# Patient Record
Sex: Female | Born: 1980
Health system: Southern US, Community
[De-identification: ages and names within clinical notes are randomized; demographics above are authoritative.]

## PROBLEM LIST (undated history)

## (undated) ENCOUNTER — Ambulatory Visit: Discharge status: Absent without leave | Payer: 59

## (undated) ENCOUNTER — Ambulatory Visit (HOSPITAL_COMMUNITY): Payer: Medicare Other

## (undated) DIAGNOSIS — I10 Essential (primary) hypertension: Secondary | ICD-10-CM

## (undated) DIAGNOSIS — N051 Unspecified nephritic syndrome with focal and segmental glomerular lesions: Secondary | ICD-10-CM

## (undated) HISTORY — PX: NEPHRECTOMY TRANSPLANTED ORGAN: SUR880

## (undated) HISTORY — PX: AV FISTULA PLACEMENT: SHX1204

---

## 2013-12-26 ENCOUNTER — Encounter (HOSPITAL_COMMUNITY): Payer: Self-pay | Admitting: Emergency Medicine

## 2013-12-26 ENCOUNTER — Emergency Department (HOSPITAL_COMMUNITY)
Admission: EM | Admit: 2013-12-26 | Discharge: 2013-12-26 | Disposition: A | Discharge status: Home / Self Care | Payer: BC Managed Care – PPO | Attending: Emergency Medicine | Admitting: Emergency Medicine

## 2013-12-26 DIAGNOSIS — Z79899 Other long term (current) drug therapy: Secondary | ICD-10-CM | POA: Diagnosis not present

## 2013-12-26 DIAGNOSIS — R21 Rash and other nonspecific skin eruption: Secondary | ICD-10-CM | POA: Diagnosis present

## 2013-12-26 DIAGNOSIS — I120 Hypertensive chronic kidney disease with stage 5 chronic kidney disease or end stage renal disease: Secondary | ICD-10-CM | POA: Insufficient documentation

## 2013-12-26 DIAGNOSIS — L299 Pruritus, unspecified: Secondary | ICD-10-CM | POA: Diagnosis not present

## 2013-12-26 DIAGNOSIS — Z720 Tobacco use: Secondary | ICD-10-CM | POA: Diagnosis not present

## 2013-12-26 DIAGNOSIS — N186 End stage renal disease: Secondary | ICD-10-CM | POA: Diagnosis not present

## 2013-12-26 DIAGNOSIS — Z992 Dependence on renal dialysis: Secondary | ICD-10-CM | POA: Diagnosis not present

## 2013-12-26 HISTORY — DX: Unspecified nephritic syndrome with focal and segmental glomerular lesions: N05.1

## 2013-12-26 HISTORY — DX: Essential (primary) hypertension: I10

## 2013-12-26 LAB — COMPREHENSIVE METABOLIC PANEL
ALK PHOS: 67 U/L (ref 39–117)
ALT: 12 U/L (ref 0–35)
AST: 12 U/L (ref 0–37)
Albumin: 3.5 g/dL (ref 3.5–5.2)
Anion gap: 17 — ABNORMAL HIGH (ref 5–15)
BUN: 79 mg/dL — ABNORMAL HIGH (ref 6–23)
CO2: 18 mEq/L — ABNORMAL LOW (ref 19–32)
Calcium: 10 mg/dL (ref 8.4–10.5)
Chloride: 101 mEq/L (ref 96–112)
Creatinine, Ser: 5.79 mg/dL — ABNORMAL HIGH (ref 0.50–1.10)
GFR calc non Af Amer: 9 mL/min — ABNORMAL LOW (ref >90)
GFR, EST AFRICAN AMERICAN: 10 mL/min — AB (ref >90)
GLUCOSE: 103 mg/dL — AB (ref 70–99)
POTASSIUM: 5 meq/L (ref 3.7–5.3)
SODIUM: 136 meq/L — AB (ref 137–147)
Total Bilirubin: 0.3 mg/dL (ref 0.3–1.2)
Total Protein: 7.5 g/dL (ref 6.0–8.3)

## 2013-12-26 LAB — CBC WITH DIFFERENTIAL/PLATELET
Basophils Absolute: 0 10*3/uL (ref 0.0–0.1)
Basophils Relative: 0 % (ref 0–1)
EOS ABS: 0.1 10*3/uL (ref 0.0–0.7)
Eosinophils Relative: 0 % (ref 0–5)
HCT: 34.1 % — ABNORMAL LOW (ref 36.0–46.0)
Hemoglobin: 11.6 g/dL — ABNORMAL LOW (ref 12.0–15.0)
LYMPHS ABS: 1.2 10*3/uL (ref 0.7–4.0)
LYMPHS PCT: 7 % — AB (ref 12–46)
MCH: 25.4 pg — AB (ref 26.0–34.0)
MCHC: 34 g/dL (ref 30.0–36.0)
MCV: 74.6 fL — ABNORMAL LOW (ref 78.0–100.0)
Monocytes Absolute: 0.4 10*3/uL (ref 0.1–1.0)
Monocytes Relative: 2 % — ABNORMAL LOW (ref 3–12)
NEUTROS PCT: 90 % — AB (ref 43–77)
Neutro Abs: 15.1 10*3/uL — ABNORMAL HIGH (ref 1.7–7.7)
Platelets: 234 10*3/uL (ref 150–400)
RBC: 4.57 MIL/uL (ref 3.87–5.11)
RDW: 13.1 % (ref 11.5–15.5)
WBC: 16.7 10*3/uL — AB (ref 4.0–10.5)

## 2013-12-26 LAB — URINALYSIS, ROUTINE W REFLEX MICROSCOPIC
BILIRUBIN URINE: NEGATIVE
GLUCOSE, UA: NEGATIVE mg/dL
Ketones, ur: NEGATIVE mg/dL
Leukocytes, UA: NEGATIVE
Nitrite: NEGATIVE
PH: 5.5 (ref 5.0–8.0)
Protein, ur: 100 mg/dL — AB
Specific Gravity, Urine: 1.011 (ref 1.005–1.030)
Urobilinogen, UA: 0.2 mg/dL (ref 0.0–1.0)

## 2013-12-26 LAB — URINE MICROSCOPIC-ADD ON

## 2013-12-26 MED ORDER — SODIUM CHLORIDE 0.9 % IV BOLUS (SEPSIS)
1000.0000 mL | Freq: Once | INTRAVENOUS | Status: AC
  Administered 2013-12-26: 1000 mL via INTRAVENOUS

## 2013-12-26 MED ORDER — HYDROXYZINE HCL 10 MG PO TABS
10.0000 mg | ORAL_TABLET | Freq: Once | ORAL | Status: AC
  Administered 2013-12-26: 10 mg via ORAL
  Filled 2013-12-26: qty 1

## 2013-12-26 MED ORDER — HYDROXYZINE HCL 10 MG PO TABS: 10.0000 mg | ORAL_TABLET | Freq: Three times a day (TID) | ORAL | Status: DC | PRN

## 2013-12-26 NOTE — Discharge Instructions (Signed)
Follow-up with your nephrologist. Return to the ER if symptoms worsen, or if you develop difficulty breathing, chest pain, high fever, cough, difficulty urinating, nausea, vomiting, diarrhea.  Pruritus  Pruritus is an itch. There are many different problems that can cause an itch. Dry skin is one of the most common causes of itching. Most cases of itching do not require medical attention.  HOME CARE INSTRUCTIONS  Make sure your skin is moistened on a regular basis. A moisturizer that contains petroleum jelly is best for keeping moisture in your skin. If you develop a rash, you may try the following for relief:   Use corticosteroid cream.  Apply cool compresses to the affected areas.  Bathe with Epsom salts or baking soda in the bathwater.  Soak in colloidal oatmeal baths. These are available at your pharmacy.  Apply baking soda paste to the rash. Stir water into baking soda until it reaches a paste-like consistency.  Use an anti-itch lotion.  Take over-the-counter diphenhydramine medicine by mouth as the instructions direct.  Avoid scratching. Scratching may cause the rash to become infected. If itching is very bad, your caregiver may suggest prescription lotions or creams to lessen your symptoms.  Avoid hot showers, which can make itching worse. A cold shower may help with itching as long as you use a moisturizer after the shower. SEEK MEDICAL CARE IF: The itching does not go away after several days. Document Released: 09/12/2010 Document Revised: 05/17/2013 Document Reviewed: 09/12/2010 [facility] Patient Information 2015 [place], [place]. This information is not intended to replace advice given to you by your health care provider. Make sure you discuss any questions you have with your health care provider.  Chronic Kidney Disease Chronic kidney disease occurs when the kidneys are damaged over a long period. The kidneys are two organs that lie on either side of the spine between the  middle of the back and the front of the abdomen. The kidneys:   Remove wastes and extra water from the blood.   Produce important hormones. These help keep bones strong, regulate blood pressure, and help create red blood cells.   Balance the fluids and chemicals in the blood and tissues. A small amount of kidney damage may not cause problems, but a large amount of damage may make it difficult or impossible for the kidneys to work the way they should. If steps are not taken to slow down the kidney damage or stop it from getting worse, the kidneys may stop working permanently. Most of the time, chronic kidney disease does not go away. However, it can often be controlled, and those with the disease can usually live normal lives. CAUSES  The most common causes of chronic kidney disease are diabetes and high blood pressure (hypertension). Chronic kidney disease may also be caused by:   Diseases that cause the kidneys' filters to become inflamed.   Diseases that affect the immune system.   Genetic diseases.   Medicines that damage the kidneys, such as anti-inflammatory medicines.  Poisoning or exposure to toxic substances.   A reoccurring kidney or urinary infection.   A problem with urine flow. This may be caused by:   Cancer.   Kidney stones.   An enlarged prostate in males. SIGNS AND SYMPTOMS  Because the kidney damage in chronic kidney disease occurs slowly, symptoms develop slowly and may not be obvious until the kidney damage becomes severe. A person may have a kidney disease for years without showing any symptoms. Symptoms can include:  Swelling (edema) of the legs, ankles, or feet.   Tiredness (lethargy).   Nausea or vomiting.   Confusion.   Problems with urination, such as:   Decreased urine production.   Frequent urination, especially at night.   Frequent accidents in children who are potty trained.   Muscle twitches and cramps.   Shortness  of breath.  Weakness.   Persistent itchiness.   Loss of appetite.  Metallic taste in the mouth.  Trouble sleeping.  Slowed development in children.  Short stature in children. DIAGNOSIS  Chronic kidney disease may be detected and diagnosed by tests, including blood, urine, imaging, or kidney biopsy tests.  TREATMENT  Most chronic kidney diseases cannot be cured. Treatment usually involves relieving symptoms and preventing or slowing the progression of the disease. Treatment may include:   A special diet. You may need to avoid alcohol and foods thatare salty and high in potassium.   Medicines. These may:   Lower blood pressure.   Relieve anemia.   Relieve swelling.   Protect the bones. HOME CARE INSTRUCTIONS   Follow your prescribed diet.   Take medicines only as directed by your health care provider. Do not take any new medicines (prescription, over-the-counter, or nutritional supplements) unless approved by your health care provider. Many medicines can worsen your kidney damage or need to have the dose adjusted.   Quit smoking if you smoke. Talk to your health care provider about a smoking cessation program.   Keep all follow-up visits as directed by your health care provider. SEEK IMMEDIATE MEDICAL CARE IF:  Your symptoms get worse or you develop new symptoms.   You develop symptoms of end-stage kidney disease. These include:   Headaches.   Abnormally dark or light skin.   Numbness in the hands or feet.   Easy bruising.   Frequent hiccups.   Menstruation stops.   You have a fever.   You have decreased urine production.   You havepain or bleeding when urinating. MAKE SURE YOU:  Understand these instructions.  Will watch your condition.  Will get help right away if you are not doing well or get worse. FOR MORE INFORMATION   American Association of Kidney Patients: BombTimer.gl  National Kidney Foundation:  www.kidney.[place]: https://mathis.com/  [facility]: www.lifeoptions.org and www.kidneyschool.org Document Released: 10/10/2007 Document Revised: 05/17/2013 Document Reviewed: 08/30/2011 [facility] Patient Information 2015 [place], [place]. This information is not intended to replace advice given to you by your health care provider. Make sure you discuss any questions you have with your health care provider.

## 2013-12-26 NOTE — ED Notes (Signed)
Ordered diet tray 

## 2013-12-26 NOTE — ED Notes (Signed)
Meal tray was ordered.

## 2013-12-26 NOTE — ED Notes (Signed)
Pt. reports generalized skin itching for 3 days unrelieved by prescription Prednisone and Benadryl , air way intact / respirations unlabored .

## 2013-12-26 NOTE — ED Provider Notes (Signed)
CSN: [id number]     Arrival date & time 12/26/13  0100 History   First MD Initiated Contact with Patient 12/26/13 [phone]     Chief Complaint  Patient presents with  . Rash     (Consider location/radiation/quality/duration/timing/severity/associated sxs/prior Treatment) HPI Meghan Richardson is a 33 year old female with past medical history of end-stage renal disease/FSGS, hypertension who presents the ER complaining of pruritus. Patient states for the past 3 days she is had a pruritic rash which began on her neck, moved to her chest, face, arms, legs, abdomen, and did not remain in any one place. Patient states she spoke to her nephrologist who prescribed her with a dose of prednisone which she has been taking for the past 3 days. She states she's also been taking Benadryl multiple times a day, and neither of these therapies that helped with her pruritus or rash. Patient denies shortness of breath, facial swelling, oral swelling, wheezing, dizziness, lightheadedness, nausea, vomiting. Patient states she's never had a reaction similar to this, and denies eating any new foods, using any new chemical products, denies any recent travel.  Past Medical History  Diagnosis Date  . FSGS (focal segmental glomerulosclerosis)   . Hypertension    History reviewed. No pertinent past surgical history. No family history on file. History  Substance Use Topics  . Smoking status: Current Some Day Smoker  . Smokeless tobacco: Not on file  . Alcohol Use: No   OB History    No data available     Review of Systems  Constitutional: Negative for fever.  HENT: Negative for trouble swallowing.   Eyes: Negative for visual disturbance.  Respiratory: Negative for shortness of breath.   Cardiovascular: Negative for chest pain.  Gastrointestinal: Negative for nausea, vomiting and abdominal pain.  Genitourinary: Negative for dysuria.  Musculoskeletal: Negative for neck pain.  Skin: Negative for rash.  Neurological:  Negative for dizziness, weakness and numbness.  Psychiatric/Behavioral: Negative.       Allergies  Review of patient's allergies indicates no known allergies.  Home Medications   Prior to Admission medications   Medication Sig Start Date End Date Taking? Authorizing Provider  calcitRIOL (ROCALTROL) 0.5 MCG capsule Take 0.5 mcg by mouth daily.   Yes Historical Provider, MD  metoprolol tartrate (LOPRESSOR) 25 MG tablet Take 25 mg by mouth 2 (two) times daily.   Yes Historical Provider, MD  predniSONE (DELTASONE) 10 MG tablet Take 10-20 mg by mouth daily with breakfast. Day 1: Two tablets before breakfast, one after lunch, one after dinner, and two at bedtime. If started late in the day, take two tablets every hour for three hours, unless otherwise directed by prescriber.  Day 2: One tablet before breakfast, one after lunch, one after dinner, and two at bedtime  Day 3: One tablet before breakfast, one after lunch, one after dinner, and one at bedtime  Day 4: One tablet before breakfast, one after lunch, and one at bedtime  Day 5: One tablet before breakfast and one at bedtime  Day 6: One tablet before breakfast   Yes Historical Provider, MD  hydrOXYzine (ATARAX/VISTARIL) 10 MG tablet Take 1 tablet (10 mg total) by mouth 3 (three) times daily as needed for itching. 12/26/13   Carrie Mew, PA-C   BP 153/91 mmHg  Pulse 50  Temp(Src) 97.7 F (36.5 C) (Oral)  Resp 17  Ht 5' 6.5" (1.689 m)  Wt 160 lb (72.576 kg)  BMI 25.44 kg/m2  SpO2 98%  LMP  Physical Exam  Constitutional: She is oriented to person, place, and time. She appears well-developed and well-nourished. No distress.  HENT:  Head: Normocephalic and atraumatic.  Eyes: Right eye exhibits no discharge. Left eye exhibits no discharge. No scleral icterus.  Neck: Normal range of motion.  Cardiovascular: Normal rate, regular rhythm and normal heart sounds.   Pulmonary/Chest: Effort normal and breath sounds normal. No respiratory  distress.  Musculoskeletal: Normal range of motion.  Neurological: She is alert and oriented to person, place, and time.  Skin: Skin is warm and dry. She is not diaphoretic.  Pruritic, erythematous macular rash noted on patient's face, upper chest, left upper leg, bilateral lower legs.  Psychiatric: She has a normal mood and affect.  Nursing note and vitals reviewed.   ED Course  Procedures (including critical care time) Labs Review Labs Reviewed  CBC WITH DIFFERENTIAL - Abnormal; Notable for the following:    WBC 16.7 (*)    Hemoglobin 11.6 (*)    HCT 34.1 (*)    MCV 74.6 (*)    MCH 25.4 (*)    Neutrophils Relative % 90 (*)    Neutro Abs 15.1 (*)    Lymphocytes Relative 7 (*)    Monocytes Relative 2 (*)    All other components within normal limits  COMPREHENSIVE METABOLIC PANEL - Abnormal; Notable for the following:    Sodium 136 (*)    CO2 18 (*)    Glucose, Bld 103 (*)    BUN 79 (*)    Creatinine, Ser 5.79 (*)    GFR calc non Af Amer 9 (*)    GFR calc Af Amer 10 (*)    Anion gap 17 (*)    All other components within normal limits  URINALYSIS, ROUTINE W REFLEX MICROSCOPIC - Abnormal; Notable for the following:    APPearance CLOUDY (*)    Hgb urine dipstick TRACE (*)    Protein, ur 100 (*)    All other components within normal limits  URINE MICROSCOPIC-ADD ON - Abnormal; Notable for the following:    Squamous Epithelial / LPF FEW (*)    All other components within normal limits    Imaging Review No results found.   EKG Interpretation None      MDM   Final diagnoses:  Pruritic disorder   Patient here 3 days of pruritus. Patient reporting that the pruritus began prior to her eruption of rash. Patient is placed on prednisone by her nephrologist for possible urticarial rash. Patient reporting comminution of prednisone and Benadryl around-the-clock have not improved her pruritus at all. Today we will attempt hydroxyzine for patient pruritus, follow-up with basic  labs and urine to rule out uremic pruritus.  Patient's labs remarkable for elevated BUN/creatinine. Patient states her BUN and creatinine have been consistently elevated at her previous nephrology visits, however she is unsure of the exact values. GFR 10, patient states her baseline is 11. UA unremarkable for acute pathology. Patient reports she also has urgent area at baseline. Electrolyte abnormalities and leukocytosis thought to be due to patient's baseline renal dysfunction and mild dehydration. We will treat with fluids, and reassess.  After hydroxyzine and fluids, patient states her pruritus has improved greatly. Patient asymptomatic on reexamination, and rash has subsided. Etiology of patient's neuritis is unclear, however uremic pruritus is a definite possibility. I strongly encouraged patient to follow up with her nephrologist regarding this episode. I discussed return precautions with patient and encourage her to call or return to ER should she have any  questions or concerns.  BP 153/91 mmHg  Pulse 50  Temp(Src) 97.7 F (36.5 C) (Oral)  Resp 17  Ht 5' 6.5" (1.689 m)  Wt 160 lb (72.576 kg)  BMI 25.44 kg/m2  SpO2 98%  LMP   Signed,  Dahlia Bailiff, PA-C 11:47 AM   Pt seen and discussed with Dr. Carmin Muskrat, MD  Carrie Mew, PA-C 12/26/13 1147  Carmin Muskrat, MD 12/26/13 [phone]

## 2013-12-26 NOTE — ED Notes (Signed)
MD at bedside. 

## 2013-12-26 NOTE — ED Notes (Signed)
Pt alert x4 respirations easy non labored.  

## 2014-07-21 ENCOUNTER — Encounter (HOSPITAL_COMMUNITY): Payer: Self-pay | Admitting: *Deleted

## 2014-07-21 ENCOUNTER — Emergency Department (HOSPITAL_COMMUNITY)
Admission: EM | Admit: 2014-07-21 | Discharge: 2014-07-22 | Disposition: A | Discharge status: Home / Self Care | Payer: Medicaid Other | Attending: Emergency Medicine | Admitting: Emergency Medicine

## 2014-07-21 DIAGNOSIS — I10 Essential (primary) hypertension: Secondary | ICD-10-CM | POA: Insufficient documentation

## 2014-07-21 DIAGNOSIS — Z79899 Other long term (current) drug therapy: Secondary | ICD-10-CM | POA: Diagnosis not present

## 2014-07-21 DIAGNOSIS — Z3202 Encounter for pregnancy test, result negative: Secondary | ICD-10-CM | POA: Diagnosis not present

## 2014-07-21 DIAGNOSIS — R1031 Right lower quadrant pain: Secondary | ICD-10-CM | POA: Diagnosis present

## 2014-07-21 DIAGNOSIS — Z8742 Personal history of other diseases of the female genital tract: Secondary | ICD-10-CM | POA: Diagnosis not present

## 2014-07-21 DIAGNOSIS — Z720 Tobacco use: Secondary | ICD-10-CM | POA: Diagnosis not present

## 2014-07-21 LAB — CBC WITH DIFFERENTIAL/PLATELET
Basophils Absolute: 0 10*3/uL (ref 0.0–0.1)
Basophils Relative: 0 % (ref 0–1)
Eosinophils Absolute: 0.2 10*3/uL (ref 0.0–0.7)
Eosinophils Relative: 3 % (ref 0–5)
HCT: 29 % — ABNORMAL LOW (ref 36.0–46.0)
Hemoglobin: 9.8 g/dL — ABNORMAL LOW (ref 12.0–15.0)
LYMPHS ABS: 2.1 10*3/uL (ref 0.7–4.0)
LYMPHS PCT: 30 % (ref 12–46)
MCH: 25.5 pg — ABNORMAL LOW (ref 26.0–34.0)
MCHC: 33.8 g/dL (ref 30.0–36.0)
MCV: 75.3 fL — ABNORMAL LOW (ref 78.0–100.0)
MONO ABS: 0.3 10*3/uL (ref 0.1–1.0)
MONOS PCT: 4 % (ref 3–12)
NEUTROS PCT: 63 % (ref 43–77)
Neutro Abs: 4.6 10*3/uL (ref 1.7–7.7)
Platelets: 224 10*3/uL (ref 150–400)
RBC: 3.85 MIL/uL — AB (ref 3.87–5.11)
RDW: 13.4 % (ref 11.5–15.5)
WBC: 7.2 10*3/uL (ref 4.0–10.5)

## 2014-07-21 LAB — COMPREHENSIVE METABOLIC PANEL
ALT: 10 U/L — ABNORMAL LOW (ref 14–54)
ANION GAP: 10 (ref 5–15)
AST: 14 U/L — AB (ref 15–41)
Albumin: 3 g/dL — ABNORMAL LOW (ref 3.5–5.0)
Alkaline Phosphatase: 54 U/L (ref 38–126)
BILIRUBIN TOTAL: 0.3 mg/dL (ref 0.3–1.2)
BUN: 76 mg/dL — AB (ref 6–20)
CALCIUM: 9.4 mg/dL (ref 8.9–10.3)
CHLORIDE: 111 mmol/L (ref 101–111)
CO2: 20 mmol/L — ABNORMAL LOW (ref 22–32)
CREATININE: 7.51 mg/dL — AB (ref 0.44–1.00)
GFR calc Af Amer: 7 mL/min — ABNORMAL LOW (ref >60)
GFR, EST NON AFRICAN AMERICAN: 6 mL/min — AB (ref >60)
Glucose, Bld: 92 mg/dL (ref 65–99)
Potassium: 4.9 mmol/L (ref 3.5–5.1)
Sodium: 141 mmol/L (ref 135–145)
Total Protein: 6.6 g/dL (ref 6.5–8.1)

## 2014-07-21 LAB — URINALYSIS, ROUTINE W REFLEX MICROSCOPIC
Bilirubin Urine: NEGATIVE
Glucose, UA: 100 mg/dL — AB
KETONES UR: NEGATIVE mg/dL
LEUKOCYTES UA: NEGATIVE
NITRITE: NEGATIVE
Protein, ur: 100 mg/dL — AB
Specific Gravity, Urine: 1.009 (ref 1.005–1.030)
Urobilinogen, UA: 0.2 mg/dL (ref 0.0–1.0)
pH: 6 (ref 5.0–8.0)

## 2014-07-21 LAB — URINE MICROSCOPIC-ADD ON

## 2014-07-21 LAB — LIPASE, BLOOD: Lipase: 72 U/L — ABNORMAL HIGH (ref 22–51)

## 2014-07-21 MED ORDER — MORPHINE SULFATE 4 MG/ML IJ SOLN
4.0000 mg | Freq: Once | INTRAMUSCULAR | Status: AC
  Administered 2014-07-22: 4 mg via INTRAVENOUS
  Filled 2014-07-21: qty 1

## 2014-07-21 MED ORDER — DELFLEX-LC/1.5% DEXTROSE 346 MOSM/L IP SOLN: Freq: Once | INTRAPERITONEAL | Status: DC

## 2014-07-21 NOTE — ED Notes (Signed)
Pt sent here from [facility] to have her peritoneal dialysis port checked out.  Pt states sharp pain to R side of abdomen that radiates to her vagina.  Pt is concerned b/c her dialysis catheter has blood it in.  Only mild tenderness noted to R abdomen - catheter on L side of abdomen.

## 2014-07-21 NOTE — ED Provider Notes (Signed)
CSN: [id number]     Arrival date & time 07/21/14  1552 History   First MD Initiated Contact with Patient 07/21/14 2002     Chief Complaint  Patient presents with  . Abdominal Pain     (Consider location/radiation/quality/duration/timing/severity/associated sxs/prior Treatment) Patient is a 34 y.o. female presenting with abdominal pain. The history is provided by the patient. No language interpreter was used.  Abdominal Pain Pain location:  RLQ Pain quality: aching and sharp   Pain radiates to:  Does not radiate Pain severity:  Moderate Duration:  2 days Timing:  Intermittent Progression:  Waxing and waning Chronicity:  New Context comment:  Peritoneal fluid is cloudy Relieved by:  Nothing Worsened by:  Nothing tried Ineffective treatments:  None tried Associated symptoms: no chest pain, no chills, no constipation, no cough, no diarrhea, no dysuria, no fatigue, no fever, no nausea, no shortness of breath, no sore throat and no vomiting   Risk factors comment:  FSGS with known CKD and peritoneal dialysis catheter   Past Medical History  Diagnosis Date  . Hypertension   . FSGS (focal segmental glomerulosclerosis)    History reviewed. No pertinent past surgical history. No family history on file. History  Substance Use Topics  . Smoking status: Current Some Day Smoker  . Smokeless tobacco: Not on file  . Alcohol Use: No   OB History    No data available     Review of Systems  Constitutional: Negative for fever, chills, diaphoresis, activity change, appetite change and fatigue.  HENT: Negative for congestion, facial swelling, rhinorrhea and sore throat.   Eyes: Negative for photophobia and discharge.  Respiratory: Negative for cough, chest tightness and shortness of breath.   Cardiovascular: Negative for chest pain, palpitations and leg swelling.  Gastrointestinal: Positive for abdominal pain. Negative for nausea, vomiting, diarrhea and constipation.  Endocrine: Negative  for polydipsia and polyuria.  Genitourinary: Negative for dysuria, frequency, difficulty urinating and pelvic pain.  Musculoskeletal: Negative for back pain, arthralgias, neck pain and neck stiffness.  Skin: Negative for color change and wound.  Allergic/Immunologic: Negative for immunocompromised state.  Neurological: Negative for facial asymmetry, weakness, numbness and headaches.  Hematological: Does not bruise/bleed easily.  Psychiatric/Behavioral: Negative for confusion and agitation.      Allergies  Review of patient's allergies indicates no known allergies.  Home Medications   Prior to Admission medications   Medication Sig Start Date End Date Taking? Authorizing Provider  calcitRIOL (ROCALTROL) 0.5 MCG capsule Take 0.5 mcg by mouth daily.   Yes Historical Provider, MD  metoprolol tartrate (LOPRESSOR) 25 MG tablet Take 25 mg by mouth 2 (two) times daily.   Yes Historical Provider, MD   BP 143/101 mmHg  Pulse 70  Temp(Src) 98.2 F (36.8 C) (Oral)  Resp 16  Ht 5\' 7"  (1.702 m)  Wt 162 lb (73.483 kg)  BMI 25.37 kg/m2  SpO2 99% Physical Exam  Constitutional: She is oriented to person, place, and time. She appears well-developed and well-nourished. No distress.  HENT:  Head: Normocephalic and atraumatic.  Mouth/Throat: No oropharyngeal exudate.  Eyes: Pupils are equal, round, and reactive to light.  Neck: Normal range of motion. Neck supple.  Cardiovascular: Normal rate, regular rhythm and normal heart sounds.  Exam reveals no gallop and no friction rub.   No murmur heard. Pulmonary/Chest: Effort normal and breath sounds normal. No respiratory distress. She has no wheezes. She has no rales.  Abdominal: Soft. Bowel sounds are normal. She exhibits no distension and no  mass. There is no tenderness. There is no rebound and no guarding.    Musculoskeletal: Normal range of motion. She exhibits no edema or tenderness.  Neurological: She is alert and oriented to person, place,  and time.  Skin: Skin is warm and dry.  Psychiatric: She has a normal mood and affect.    ED Course  Procedures (including critical care time) Labs Review Labs Reviewed  CBC WITH DIFFERENTIAL/PLATELET - Abnormal; Notable for the following:    RBC 3.85 (*)    Hemoglobin 9.8 (*)    HCT 29.0 (*)    MCV 75.3 (*)    MCH 25.5 (*)    All other components within normal limits  COMPREHENSIVE METABOLIC PANEL - Abnormal; Notable for the following:    CO2 20 (*)    BUN 76 (*)    Creatinine, Ser 7.51 (*)    Albumin 3.0 (*)    AST 14 (*)    ALT 10 (*)    GFR calc non Af Amer 6 (*)    GFR calc Af Amer 7 (*)    All other components within normal limits  LIPASE, BLOOD - Abnormal; Notable for the following:    Lipase 72 (*)    All other components within normal limits  URINALYSIS, ROUTINE W REFLEX MICROSCOPIC (NOT AT [facility]) - Abnormal; Notable for the following:    Glucose, UA 100 (*)    Hgb urine dipstick SMALL (*)    Protein, ur 100 (*)    All other components within normal limits  URINE MICROSCOPIC-ADD ON - Abnormal; Notable for the following:    Squamous Epithelial / LPF FEW (*)    Bacteria, UA FEW (*)    All other components within normal limits  BODY FLUID CULTURE  LACTATE DEHYDROGENASE, BODY FLUID  GLUCOSE, PERITONEAL FLUID  PROTEIN, BODY FLUID  ALBUMIN, FLUID  BODY FLUID CELL COUNT WITH DIFFERENTIAL  CSF CELL COUNT WITH DIFFERENTIAL  I-STAT BETA HCG BLOOD, ED ([facility], [facility], [facility] ONLY)    Imaging Review No results found.   EKG Interpretation None      MDM   Final diagnoses:  RLQ abdominal pain    Pt is a 34 y.o. female with Pmhx as above who presents with R sided abdominal pain and cloudy fluid from dialysis port.  Patient was diagnosed with FSGS at age 60, states she has had appears Milta Deiters dialysis catheter since the spring but has not yet been started on dialysis.  She states that she has a home health nurse come and flush catheter once or twice a week and this week on 2  occasions, the fluid was cloudy.  She also started having some right-sided abdominal pain.  She denies fevers, chills, nausea, vomiting, diarrhea.  She has been living in [place] since April after moving from [place], [place].  However, she has not yet set up care with a nephrologist or her primary doctor.  She says she will be referred to [facility] center, but does not yet have an appointment. On PE, VSS, pt in NAD.  She is localized right lower quadrant tenderness without rebound or guarding.  Her to peritoneal fluid in her left-sided peritoneal dialysis catheter is cloudy and blood tinged.  I spoke to dialysis nurse, who will infuse saline for 2 hours, which will then be drawn off for cell count and differential and culture. Marland Kitchen  Spoke with Dr. Alona Bene dating of [place] [facility], who recommends patient be started on IV Fortaz 2g and vancomycin 20  mg/kg,  If the cell count is greater than 100.  Given that she has no outpatient follow-up.  I feel she will have to be admitted to medical service, if instilled fluid is concerning for peritonitis. Dr. Kathrynn Humble will f/u on CBC/diff and CT.        Ernestina Patches, MD 07/22/14 [phone]

## 2014-07-22 ENCOUNTER — Emergency Department (HOSPITAL_COMMUNITY): Payer: Medicaid Other

## 2014-07-22 LAB — BODY FLUID CELL COUNT WITH DIFFERENTIAL: Total Nucleated Cell Count, Fluid: 0 cu mm (ref 0–1000)

## 2014-07-22 LAB — GLUCOSE, PERITONEAL FLUID: GLUCOSE, PERITONEAL FLUID: 1357 mg/dL

## 2014-07-22 LAB — ALBUMIN, FLUID (OTHER): Albumin, Fluid: <1 g/dL

## 2014-07-22 LAB — PROTEIN, BODY FLUID

## 2014-07-22 LAB — LACTATE DEHYDROGENASE, PLEURAL OR PERITONEAL FLUID

## 2014-07-22 LAB — I-STAT BETA HCG BLOOD, ED (MC, WL, AP ONLY): I-stat hCG, quantitative: <5 m[IU]/mL (ref <5)

## 2014-07-22 MED ORDER — OXYCODONE-ACETAMINOPHEN 5-325 MG PO TABS
1.0000 | ORAL_TABLET | Freq: Once | ORAL | Status: AC
  Administered 2014-07-22: 1 via ORAL
  Filled 2014-07-22: qty 1

## 2014-07-22 MED ORDER — HYDROCODONE-ACETAMINOPHEN 5-325 MG PO TABS: 1.0000 | ORAL_TABLET | Freq: Four times a day (QID) | ORAL | Status: DC | PRN

## 2014-07-22 NOTE — Discharge Instructions (Signed)
Abdominal Pain, Women °Abdominal (stomach, pelvic, or belly) pain can be caused by many things. It is important to tell your doctor: °· The location of the pain. °· Does it come and go or is it present all the time? °· Are there things that start the pain (eating certain foods, exercise)? °· Are there other symptoms associated with the pain (fever, nausea, vomiting, diarrhea)? °All of this is helpful to know when trying to find the cause of the pain. °CAUSES  °· Stomach: virus or bacteria infection, or ulcer. °· Intestine: appendicitis (inflamed appendix), regional ileitis (Crohn's disease), ulcerative colitis (inflamed colon), irritable bowel syndrome, diverticulitis (inflamed diverticulum of the colon), or cancer of the stomach or intestine. °· Gallbladder disease or stones in the gallbladder. °· Kidney disease, kidney stones, or infection. °· Pancreas infection or cancer. °· Fibromyalgia (pain disorder). °· Diseases of the female organs: °¨ Uterus: fibroid (non-cancerous) tumors or infection. °¨ Fallopian tubes: infection or tubal pregnancy. °¨ Ovary: cysts or tumors. °¨ Pelvic adhesions (scar tissue). °¨ Endometriosis (uterus lining tissue growing in the pelvis and on the pelvic organs). °¨ Pelvic congestion syndrome (female organs filling up with blood just before the menstrual period). °¨ Pain with the menstrual period. °¨ Pain with ovulation (producing an egg). °¨ Pain with an IUD (intrauterine device, birth control) in the uterus. °¨ Cancer of the female organs. °· Functional pain (pain not caused by a disease, may improve without treatment). °· Psychological pain. °· Depression. °DIAGNOSIS  °Your doctor will decide the seriousness of your pain by doing an examination. °· Blood tests. °· X-rays. °· Ultrasound. °· CT scan (computed tomography, special type of X-ray). °· MRI (magnetic resonance imaging). °· Cultures, for infection. °· Barium enema (dye inserted in the large intestine, to better view it with  X-rays). °· Colonoscopy (looking in intestine with a lighted tube). °· Laparoscopy (minor surgery, looking in abdomen with a lighted tube). °· Major abdominal exploratory surgery (looking in abdomen with a large incision). °TREATMENT  °The treatment will depend on the cause of the pain.  °· Many cases can be observed and treated at home. °· Over-the-counter medicines recommended by your caregiver. °· Prescription medicine. °· Antibiotics, for infection. °· Birth control pills, for painful periods or for ovulation pain. °· Hormone treatment, for endometriosis. °· Nerve blocking injections. °· Physical therapy. °· Antidepressants. °· Counseling with a psychologist or psychiatrist. °· Minor or major surgery. °HOME CARE INSTRUCTIONS  °· Do not take laxatives, unless directed by your caregiver. °· Take over-the-counter pain medicine only if ordered by your caregiver. Do not take aspirin because it can cause an upset stomach or bleeding. °· Try a clear liquid diet (broth or water) as ordered by your caregiver. Slowly move to a bland diet, as tolerated, if the pain is related to the stomach or intestine. °· Have a thermometer and take your temperature several times a day, and record it. °· Bed rest and sleep, if it helps the pain. °· Avoid sexual intercourse, if it causes pain. °· Avoid stressful situations. °· Keep your follow-up appointments and tests, as your caregiver orders. °· If the pain does not go away with medicine or surgery, you may try: °¨ Acupuncture. °¨ Relaxation exercises (yoga, meditation). °¨ Group therapy. °¨ Counseling. °SEEK MEDICAL CARE IF:  °· You notice certain foods cause stomach pain. °· Your home care treatment is not helping your pain. °· You need stronger pain medicine. °· You want your IUD removed. °· You feel faint or   lightheaded. °· You develop nausea and vomiting. °· You develop a rash. °· You are having side effects or an allergy to your medicine. °SEEK IMMEDIATE MEDICAL CARE IF:  °· Your  pain does not go away or gets worse. °· You have a fever. °· Your pain is felt only in portions of the abdomen. The right side could possibly be appendicitis. The left lower portion of the abdomen could be colitis or diverticulitis. °· You are passing blood in your stools (bright red or black tarry stools, with or without vomiting). °· You have blood in your urine. °· You develop chills, with or without a fever. °· You pass out. °MAKE SURE YOU:  °· Understand these instructions. °· Will watch your condition. °· Will get help right away if you are not doing well or get worse. °Document Released: 10/28/2006 Document Revised: 05/17/2013 Document Reviewed: 11/17/2008 °[employer]® Patient Information ©2015 [employer]. This information is not intended to replace advice given to you by your health care provider. Make sure you discuss any questions you have with your health care provider. ° °

## 2014-07-22 NOTE — ED Provider Notes (Signed)
  Physical Exam  BP 122/85 mmHg  Pulse 60  Temp(Src) 98.2 F (36.8 C) (Oral)  Resp 16  Ht 5\' 7"  (1.702 m)  Wt 162 lb (73.483 kg)  BMI 25.37 kg/m2  SpO2 100%  Physical Exam  ED Course  Procedures  MDM  Pt signed out to me by Dr. Tawnya Crook. Pt's CT scan is neg. Pt came for abd pain, she has ESRD and has catheter for peritoneal dialysis started. Pt had peritoneal fluid sent to the lab, and the results are pending. The lab results are normal - so we will discharge.       Varney Biles, MD 07/22/14 [phone]

## 2014-07-22 NOTE — ED Notes (Signed)
Pt returned from ct

## 2014-07-22 NOTE — ED Notes (Signed)
Pt verbalizes understanding of d/c instructions and denies any further needs at this time. 

## 2014-07-25 LAB — BODY FLUID CULTURE: Culture: NO GROWTH

## 2014-07-26 ENCOUNTER — Ambulatory Visit: Payer: Medicaid Other | Admitting: Family Medicine

## 2014-09-23 ENCOUNTER — Other Ambulatory Visit: Payer: Self-pay | Admitting: Nephrology

## 2014-09-23 ENCOUNTER — Ambulatory Visit
Admission: RE | Admit: 2014-09-23 | Discharge: 2014-09-23 | Disposition: A | Discharge status: Home / Self Care | Payer: Medicaid Other | Source: Ambulatory Visit | Attending: Nephrology | Admitting: Nephrology

## 2014-09-23 DIAGNOSIS — M25511 Pain in right shoulder: Secondary | ICD-10-CM

## 2014-09-23 DIAGNOSIS — Z95828 Presence of other vascular implants and grafts: Secondary | ICD-10-CM

## 2014-10-02 ENCOUNTER — Encounter (HOSPITAL_COMMUNITY): Payer: Self-pay | Admitting: *Deleted

## 2014-10-02 DIAGNOSIS — Z992 Dependence on renal dialysis: Secondary | ICD-10-CM

## 2014-10-02 DIAGNOSIS — I120 Hypertensive chronic kidney disease with stage 5 chronic kidney disease or end stage renal disease: Secondary | ICD-10-CM | POA: Diagnosis present

## 2014-10-02 DIAGNOSIS — Z79891 Long term (current) use of opiate analgesic: Secondary | ICD-10-CM

## 2014-10-02 DIAGNOSIS — K5900 Constipation, unspecified: Secondary | ICD-10-CM | POA: Diagnosis present

## 2014-10-02 DIAGNOSIS — Y841 Kidney dialysis as the cause of abnormal reaction of the patient, or of later complication, without mention of misadventure at the time of the procedure: Secondary | ICD-10-CM | POA: Diagnosis present

## 2014-10-02 DIAGNOSIS — D631 Anemia in chronic kidney disease: Secondary | ICD-10-CM | POA: Diagnosis present

## 2014-10-02 DIAGNOSIS — N186 End stage renal disease: Secondary | ICD-10-CM | POA: Diagnosis present

## 2014-10-02 DIAGNOSIS — Z79899 Other long term (current) drug therapy: Secondary | ICD-10-CM

## 2014-10-02 DIAGNOSIS — K659 Peritonitis, unspecified: Secondary | ICD-10-CM | POA: Diagnosis present

## 2014-10-02 DIAGNOSIS — T8029XA Infection following other infusion, transfusion and therapeutic injection, initial encounter: Principal | ICD-10-CM | POA: Diagnosis present

## 2014-10-02 NOTE — ED Notes (Signed)
Pt states that she has peritonitis because her peritoneal dialysis fluid is cloudy.

## 2014-10-03 ENCOUNTER — Encounter (HOSPITAL_COMMUNITY): Payer: Self-pay | Admitting: Internal Medicine

## 2014-10-03 ENCOUNTER — Inpatient Hospital Stay (HOSPITAL_COMMUNITY)
Admission: EM | Admit: 2014-10-03 | Discharge: 2014-10-03 | DRG: 867 | Disposition: A | Discharge status: Home / Self Care | Payer: Medicaid Other | Attending: Internal Medicine | Admitting: Internal Medicine

## 2014-10-03 DIAGNOSIS — K658 Other peritonitis: Secondary | ICD-10-CM | POA: Diagnosis present

## 2014-10-03 DIAGNOSIS — N269 Renal sclerosis, unspecified: Secondary | ICD-10-CM

## 2014-10-03 DIAGNOSIS — I120 Hypertensive chronic kidney disease with stage 5 chronic kidney disease or end stage renal disease: Secondary | ICD-10-CM | POA: Diagnosis present

## 2014-10-03 DIAGNOSIS — B9689 Other specified bacterial agents as the cause of diseases classified elsewhere: Secondary | ICD-10-CM

## 2014-10-03 DIAGNOSIS — Y841 Kidney dialysis as the cause of abnormal reaction of the patient, or of later complication, without mention of misadventure at the time of the procedure: Secondary | ICD-10-CM | POA: Diagnosis present

## 2014-10-03 DIAGNOSIS — N186 End stage renal disease: Secondary | ICD-10-CM

## 2014-10-03 DIAGNOSIS — K652 Spontaneous bacterial peritonitis: Secondary | ICD-10-CM

## 2014-10-03 DIAGNOSIS — T8029XA Infection following other infusion, transfusion and therapeutic injection, initial encounter: Secondary | ICD-10-CM | POA: Diagnosis present

## 2014-10-03 DIAGNOSIS — N189 Chronic kidney disease, unspecified: Secondary | ICD-10-CM

## 2014-10-03 DIAGNOSIS — K659 Peritonitis, unspecified: Secondary | ICD-10-CM | POA: Diagnosis present

## 2014-10-03 DIAGNOSIS — D631 Anemia in chronic kidney disease: Secondary | ICD-10-CM | POA: Diagnosis present

## 2014-10-03 DIAGNOSIS — Z79891 Long term (current) use of opiate analgesic: Secondary | ICD-10-CM | POA: Diagnosis not present

## 2014-10-03 DIAGNOSIS — Z79899 Other long term (current) drug therapy: Secondary | ICD-10-CM | POA: Diagnosis not present

## 2014-10-03 DIAGNOSIS — Z992 Dependence on renal dialysis: Secondary | ICD-10-CM | POA: Diagnosis not present

## 2014-10-03 DIAGNOSIS — K5900 Constipation, unspecified: Secondary | ICD-10-CM | POA: Diagnosis present

## 2014-10-03 DIAGNOSIS — T8571XA Infection and inflammatory reaction due to peritoneal dialysis catheter, initial encounter: Secondary | ICD-10-CM | POA: Diagnosis present

## 2014-10-03 LAB — CBC WITH DIFFERENTIAL/PLATELET
Basophils Absolute: 0 10*3/uL (ref 0.0–0.1)
Basophils Relative: 0 %
Eosinophils Absolute: 0.3 10*3/uL (ref 0.0–0.7)
Eosinophils Relative: 2 %
HEMATOCRIT: 29.6 % — AB (ref 36.0–46.0)
HEMOGLOBIN: 9.8 g/dL — AB (ref 12.0–15.0)
Lymphocytes Relative: 22 %
Lymphs Abs: 2.5 10*3/uL (ref 0.7–4.0)
MCH: 25.3 pg — AB (ref 26.0–34.0)
MCHC: 33.1 g/dL (ref 30.0–36.0)
MCV: 76.5 fL — ABNORMAL LOW (ref 78.0–100.0)
MONOS PCT: 4 %
Monocytes Absolute: 0.5 10*3/uL (ref 0.1–1.0)
NEUTROS ABS: 7.9 10*3/uL — AB (ref 1.7–7.7)
NEUTROS PCT: 72 %
Platelets: 262 10*3/uL (ref 150–400)
RBC: 3.87 MIL/uL (ref 3.87–5.11)
RDW: 13.2 % (ref 11.5–15.5)
WBC: 11.2 10*3/uL — ABNORMAL HIGH (ref 4.0–10.5)

## 2014-10-03 LAB — ALBUMIN, FLUID (OTHER): Albumin, Fluid: <1 g/dL

## 2014-10-03 LAB — PROTEIN, BODY FLUID

## 2014-10-03 LAB — BODY FLUID CELL COUNT WITH DIFFERENTIAL
Eos, Fluid: 1 %
Lymphs, Fluid: 1 %
Monocyte-Macrophage-Serous Fluid: 8 % — ABNORMAL LOW (ref 50–90)
Neutrophil Count, Fluid: 90 % — ABNORMAL HIGH (ref 0–25)
Total Nucleated Cell Count, Fluid: 17000 cu mm — ABNORMAL HIGH (ref 0–1000)

## 2014-10-03 LAB — BASIC METABOLIC PANEL
Anion gap: 9 (ref 5–15)
BUN: 58 mg/dL — ABNORMAL HIGH (ref 6–20)
CHLORIDE: 109 mmol/L (ref 101–111)
CO2: 23 mmol/L (ref 22–32)
Calcium: 8.9 mg/dL (ref 8.9–10.3)
Creatinine, Ser: 8.88 mg/dL — ABNORMAL HIGH (ref 0.44–1.00)
GFR calc non Af Amer: 5 mL/min — ABNORMAL LOW (ref >60)
GFR, EST AFRICAN AMERICAN: 6 mL/min — AB (ref >60)
Glucose, Bld: 88 mg/dL (ref 65–99)
Potassium: 4.7 mmol/L (ref 3.5–5.1)
Sodium: 141 mmol/L (ref 135–145)

## 2014-10-03 LAB — LACTIC ACID, PLASMA: LACTIC ACID, VENOUS: 0.6 mmol/L (ref 0.5–2.0)

## 2014-10-03 LAB — LACTATE DEHYDROGENASE, PLEURAL OR PERITONEAL FLUID: LD, Fluid: 96 U/L — ABNORMAL HIGH (ref 3–23)

## 2014-10-03 LAB — GLUCOSE, PERITONEAL FLUID: Glucose, Peritoneal Fluid: 130 mg/dL

## 2014-10-03 MED ORDER — ONDANSETRON HCL 4 MG/2ML IJ SOLN
4.0000 mg | Freq: Once | INTRAMUSCULAR | Status: AC
  Administered 2014-10-03: 4 mg via INTRAVENOUS
  Filled 2014-10-03: qty 2

## 2014-10-03 MED ORDER — DEXTROSE 5 % IV SOLN
2.0000 g | Freq: Once | INTRAVENOUS | Status: AC
  Administered 2014-10-03: 2 g via INTRAVENOUS
  Filled 2014-10-03: qty 2

## 2014-10-03 MED ORDER — VANCOMYCIN HCL IN DEXTROSE 1-5 GM/200ML-% IV SOLN
1000.0000 mg | Freq: Once | INTRAVENOUS | Status: AC
  Administered 2014-10-03: 1000 mg via INTRAVENOUS
  Filled 2014-10-03: qty 200

## 2014-10-03 MED ORDER — DEXTROSE 5 % IV SOLN: 500.0000 mg | INTRAVENOUS | Status: DC

## 2014-10-03 MED ORDER — OXYCODONE-ACETAMINOPHEN 5-325 MG PO TABS
1.0000 | ORAL_TABLET | Freq: Once | ORAL | Status: AC
  Administered 2014-10-03: 1 via ORAL
  Filled 2014-10-03: qty 1

## 2014-10-03 MED ORDER — MORPHINE SULFATE (PF) 4 MG/ML IV SOLN
4.0000 mg | Freq: Once | INTRAVENOUS | Status: AC
  Administered 2014-10-03: 4 mg via INTRAVENOUS
  Filled 2014-10-03: qty 1

## 2014-10-03 NOTE — Discharge Summary (Signed)
Name: Meghan Richardson MRN: [id number] DOB: [birth year] 34 y.o. PCP: No Pcp Per Patient  Date of Admission: 10/03/2014  2:20 AM Date of Discharge: 10/03/2014 Attending Physician: No att. providers found  Discharge Diagnosis: Principal Problem:   Peritonitis associated with peritoneal dialysis Active Problems:   Hypertension due to end stage renal disease on dialysis   Anemia in chronic kidney disease (CKD)  Discharge Medications:   Medication List    TAKE these medications        calcitRIOL 0.5 MCG capsule  Commonly known as:  ROCALTROL  Take 0.5 mcg by mouth daily.     HYDROcodone-acetaminophen 5-325 MG per tablet  Commonly known as:  NORCO/VICODIN  Take 1 tablet by mouth every 6 (six) hours as needed.     metoprolol tartrate 25 MG tablet  Commonly known as:  LOPRESSOR  Take 25 mg by mouth 2 (two) times daily.        Disposition and follow-up:   Ms.Meghan Richardson was discharged from [facility] in Stable condition.  At the hospital follow up visit please address:  1. Patient discharged to follow up with Dr Joelyn Oms today for further treatment with intraperitoneal antibiotics  2.  Labs / imaging needed at time of follow-up: none  3.  Pending labs/ test needing follow-up: Blood cultures and peritoneal fluid culture  Follow-up Appointments: Follow-up Information    Follow up with Rexene Agent, MD.   Specialty:  Nephrology   Why:  please go to your appointment today with Dr Dossie Arbour information:   [place] [phone]       Discharge Instructions: Discharge Instructions    Call MD for:  persistant nausea and vomiting    Complete by:  As directed      Call MD for:  severe uncontrolled pain    Complete by:  As directed      Call MD for:  temperature >100.4    Complete by:  As directed      Diet - low sodium heart healthy    Complete by:  As directed      Increase activity slowly    Complete by:  As  directed            Consultations: Treatment Team:  Roney Jaffe, MD  Procedures Performed:  Dg Abd 1 View  09/23/2014   CLINICAL DATA:  Dialysis catheter.  EXAM: ABDOMEN - 1 VIEW  COMPARISON:  07/22/2014.  FINDINGS: Peritoneal dialysis catheter is noted intact with its tip coiled in the right lower quadrant. No bowel distention. Stool noted throughout the colon. Pelvic calcifications consistent phleboliths. No acute bony abnormality.  IMPRESSION: Peritoneal dialysis catheter noted intact and in stable position. No acute abnormality.   Electronically Signed   By: Marcello Moores  Register   On: 09/23/2014 15:44    Admission HPI: Meghan Richardson is a 34 year old female with PMH of ESRD 2/2 FSGS who recently started peritoneal dialysis 3 weeks ago. She reports that yesterday she started to have some abdominal cramping similar to her menstrual cycle but was not due for her menses. She reports that the cramping intensified this morning so she called her dialysis nurse who instructed her to come to the ED for evaluation for possible peritonitis. She reports that her abdominal pain continued to intensify. She denies any fever or chills. She reports that besides her abdominal pain she feels like her normal healthy self. In the ED peritoneal fluid was  sent and was suggestive of infections and she was started on broad spectrum abx.  Her dialysis catheter was placed in April, she does report one previous episode of peritonitis which she reports was the nurses fault. She notes that she has been doing everything right and she is not sure how she got this infection. Dr Joelyn Oms is her nephrologist.  Hospital Course : Principal Problem:   Peritonitis associated with peritoneal dialysis Active Problems:   Hypertension due to end stage renal disease on dialysis   Anemia in chronic kidney disease (CKD)   Patient presented with abdominal cramping and abdominal pain.  She had recently completed a 10 day course of  intraperitoneal antibiotics for culture negative peritonitis.  A peritoneal fluid sample was obtained and showed 17,000 WBC (neutrophil predominance) c/w bacterial peritonitis although gram stain was negative.  She was started on IV Vancomycin and Ceftazidime and IMTS was consulted for admission.  After admission she was evaluated by Dr Jonnie Finner nephrology who recommended she be discharged home as she had a follow up appointment scheduled for 4pm with Dr Joelyn Oms who will resume intraperitoneal antibiotics.  She was discharged with specific instructions to follow up today with Dr Joelyn Oms and if unable she needs to return to the ED.   Discharge Vitals:   BP 108/72 mmHg  Pulse 71  Temp(Src) 98.1 F (36.7 C) (Oral)  Resp 18  Ht 5\' 7"  (1.702 m)  Wt 158 lb (71.668 kg)  BMI 24.74 kg/m2  SpO2 97%  Discharge Labs:  Results for orders placed or performed during the hospital encounter of 10/03/14 (from the past 24 hour(s))  CBC with Differential/Platelet     Status: Abnormal   Collection Time: 10/03/14  2:42 AM  Result Value Ref Range   WBC 11.2 (H) 4.0 - 10.5 K/uL   RBC 3.87 3.87 - 5.11 MIL/uL   Hemoglobin 9.8 (L) 12.0 - 15.0 g/dL   HCT 29.6 (L) 36.0 - 46.0 %   MCV 76.5 (L) 78.0 - 100.0 fL   MCH 25.3 (L) 26.0 - 34.0 pg   MCHC 33.1 30.0 - 36.0 g/dL   RDW 13.2 11.5 - 15.5 %   Platelets 262 150 - 400 K/uL   Neutrophils Relative % 72 %   Neutro Abs 7.9 (H) 1.7 - 7.7 K/uL   Lymphocytes Relative 22 %   Lymphs Abs 2.5 0.7 - 4.0 K/uL   Monocytes Relative 4 %   Monocytes Absolute 0.5 0.1 - 1.0 K/uL   Eosinophils Relative 2 %   Eosinophils Absolute 0.3 0.0 - 0.7 K/uL   Basophils Relative 0 %   Basophils Absolute 0.0 0.0 - 0.1 K/uL  Basic metabolic panel     Status: Abnormal   Collection Time: 10/03/14  2:42 AM  Result Value Ref Range   Sodium 141 135 - 145 mmol/L   Potassium 4.7 3.5 - 5.1 mmol/L   Chloride 109 101 - 111 mmol/L   CO2 23 22 - 32 mmol/L   Glucose, Bld 88 65 - 99 mg/dL   BUN 58  (H) 6 - 20 mg/dL   Creatinine, Ser 8.88 (H) 0.44 - 1.00 mg/dL   Calcium 8.9 8.9 - 10.3 mg/dL   GFR calc non Af Amer 5 (L) >60 mL/min   GFR calc Af Amer 6 (L) >60 mL/min   Anion gap 9 5 - 15  Lactic acid, plasma     Status: None   Collection Time: 10/03/14  2:42 AM  Result Value Ref Range  Lactic Acid, Venous 0.6 0.5 - 2.0 mmol/L  Lactate dehydrogenase, Peritoneal fluid     Status: Abnormal   Collection Time: 10/03/14  4:33 AM  Result Value Ref Range   LD, Fluid 96 (H) 3 - 23 U/L   Fluid Type-FLDH FLUID   Glucose, Peritoneal fluid     Status: None   Collection Time: 10/03/14  4:33 AM  Result Value Ref Range   Glucose, Peritoneal Fluid 130 mg/dL  Protein, Peritoneal fluid     Status: None   Collection Time: 10/03/14  4:33 AM  Result Value Ref Range   Total protein, fluid <3.0 g/dL   Fluid Type-FTP FLUID   Albumin, Peritoneal fluid     Status: None   Collection Time: 10/03/14  4:33 AM  Result Value Ref Range   Albumin, Fluid <1.0 g/dL   Fluid Type-FALB FLUID   Body fluid culture     Status: None (Preliminary result)   Collection Time: 10/03/14  4:33 AM  Result Value Ref Range   Specimen Description FLUID PERITONEAL    Special Requests NONE    Gram Stain      ABUNDANT WBC PRESENT,BOTH PMN AND MONONUCLEAR NO ORGANISMS SEEN RESULT CALLED TO, READ BACK BY AND VERIFIED WITH: L.BISHOP,RN [id number] 10/03/14 M.CAMPBELL    Culture PENDING    Report Status PENDING   Body fluid cell count with differential     Status: Abnormal   Collection Time: 10/03/14  5:54 AM  Result Value Ref Range   Fluid Type-FCT PERITONEAL    Color, Fluid COLORLESS (A) YELLOW   Appearance, Fluid TURBID (A) CLEAR   WBC, Fluid 17000 (H) 0 - 1000 cu mm   Neutrophil Count, Fluid 90 (H) 0 - 25 %   Lymphs, Fluid 1 %   Monocyte-Macrophage-Serous Fluid 8 (L) 50 - 90 %   Eos, Fluid 1 %    Signed: Jule Ser, DO 10/03/2014, 2:50 PM    Services Ordered on Discharge: none Equipment Ordered on Discharge:  none

## 2014-10-03 NOTE — Consult Note (Signed)
Renal Service Consult Note [facility]  Meghan Richardson 10/03/2014 [place] Requesting Physician:  Dr Baxter Flattery, ED at [facility]  Reason for Consult:  eSRD pt with abd pain, on PD HPI: The patient is a 34 y.o. year-old with hx of FSGS started PD about 1 month ago. She had an episode of infected fluid early and just finished a course of IP abx about 1 week ago.  She comes to ED with abd pain x 24 hrs.  No n/v/d, no fevers, chills or sweats.  No cough or SOB , no CP.  PD fluid sample drained in ED by [id number] nurse showed TNC 17,000 and gram stain no organisms seen.   She is frustrated and is not sure "what I am doing wrong".  She is compliant according to the nurses at the PD clinic.     Past Medical History  Past Medical History  Diagnosis Date  . Hypertension   . FSGS (focal segmental glomerulosclerosis)     Reports Kidney Bx at age 25   Past Surgical History History reviewed. No pertinent past surgical history. Family History  Family History  Problem Relation Age of Onset  . Kidney disease Brother     FSGS   Social History  reports that she has quit smoking. She does not have any smokeless tobacco history on file. She reports that she does not drink alcohol or use illicit drugs. Allergies No Known Allergies Home medications Prior to Admission medications   Medication Sig Start Date End Date Taking? Authorizing Provider  calcitRIOL (ROCALTROL) 0.5 MCG capsule Take 0.5 mcg by mouth daily.    Historical Provider, MD  HYDROcodone-acetaminophen (NORCO/VICODIN) 5-325 MG per tablet Take 1 tablet by mouth every 6 (six) hours as needed. 07/22/14   Varney Biles, MD  metoprolol tartrate (LOPRESSOR) 25 MG tablet Take 25 mg by mouth 2 (two) times daily.    Historical Provider, MD   Liver Function Tests No results for input(s): AST, ALT, ALKPHOS, BILITOT, PROT, ALBUMIN in the last 168 hours. No results for input(s): LIPASE, AMYLASE in the last 168 hours. CBC  Recent Labs Lab  10/03/14 0242  WBC 11.2*  NEUTROABS 7.9*  HGB 9.8*  HCT 29.6*  MCV 76.5*  PLT 99991111   Basic Metabolic Panel  Recent Labs Lab 10/03/14 0242  NA 141  K 4.7  CL 109  CO2 23  GLUCOSE 88  BUN 58*  CREATININE 8.88*  CALCIUM 8.9    Filed Vitals:   10/03/14 0715 10/03/14 0745 10/03/14 0800 10/03/14 0815  BP: 111/80 117/80 113/79 115/77  Pulse: 80 77 72 80  Temp:      TempSrc:      Resp:      Height:      Weight:      SpO2: 96% 98% 97% 99%   Exam Alert, no distress, calm No rash, cyanosis or gangrene Sclera anicteric, throat clear No JVd Chest clear bilat RRR no MRG ABd mild-mod diffuse tenderness, +BS, clean PD cath exit site GU deferred MS no LE edema Neuro is ox 3, nf   Assessment: 1. Recurrent PD cath-related peritonitis - just finished 10 day course of abx for culture negative peritonitis treated as outpatient. This is likely the same infection. Plan is for IV vanc/ fortaz in ED and then dc home w pain/ nausea medication.  Gram stain of PD fluid is negative and cell count is high at 17,000. She has appt later today w DR Joelyn Oms who will continue her  on IP abx for a longer course.   2. ESRD d/t FSGS, on CCPD   Plan- as above, have d/w ED physicians  Kelly Splinter MD (pgr) [phone]    [phone] 10/03/2014, 9:16 AM

## 2014-10-03 NOTE — Progress Notes (Signed)
ANTIBIOTIC CONSULT NOTE - INITIAL  Pharmacy Consult for vancomycin + ceftazidime Indication: peritonitis  No Known Allergies  Patient Measurements: Height: 5\' 7"  (170.2 cm) Weight: 158 lb (71.668 kg) IBW/kg (Calculated) : 61.6 Adjusted Body Weight:   Vital Signs: Temp: 98.1 F (36.7 C) (09/18 2321) Temp Source: Oral (09/18 2321) BP: 116/72 mmHg (09/19 0900) Pulse Rate: 71 (09/19 0900) Intake/Output from previous day:   Intake/Output from this shift: Total I/O In: 50 [I.V.:50] Out: -   Labs:  Recent Labs  10/03/14 0242  WBC 11.2*  HGB 9.8*  PLT 262  CREATININE 8.88*   Estimated Creatinine Clearance: 8.8 mL/min (by C-G formula based on Cr of 8.88). No results for input(s): VANCOTROUGH, VANCOPEAK, VANCORANDOM, GENTTROUGH, GENTPEAK, GENTRANDOM, TOBRATROUGH, TOBRAPEAK, TOBRARND, AMIKACINPEAK, AMIKACINTROU, AMIKACIN in the last 72 hours.   Microbiology: Recent Results (from the past 720 hour(s))  Body fluid culture     Status: None (Preliminary result)   Collection Time: 10/03/14  4:33 AM  Result Value Ref Range Status   Specimen Description FLUID PERITONEAL  Final   Special Requests NONE  Final   Gram Stain   Final    ABUNDANT WBC PRESENT,BOTH PMN AND MONONUCLEAR NO ORGANISMS SEEN RESULT CALLED TO, READ BACK BY AND VERIFIED WITH: L.BISHOP,RN [id number] 10/03/14 M.CAMPBELL    Culture PENDING  Incomplete   Report Status PENDING  Incomplete    Medical History: Past Medical History  Diagnosis Date  . Hypertension   . FSGS (focal segmental glomerulosclerosis)     Reports Kidney Bx at age 34    Medications:  Anti-infectives    Start     Dose/Rate Route Frequency Ordered Stop   10/04/14 0800  cefTAZidime (FORTAZ) 500 mg in dextrose 5 % 50 mL IVPB     500 mg 100 mL/hr over 30 Minutes Intravenous Every 24 hours 10/03/14 0948     10/03/14 1000  vancomycin (VANCOCIN) IVPB 1000 mg/200 mL premix     1,000 mg 200 mL/hr over 60 Minutes Intravenous  Once 10/03/14 0947     10/03/14 0700  vancomycin (VANCOCIN) IVPB 1000 mg/200 mL premix     1,000 mg 200 mL/hr over 60 Minutes Intravenous  Once 10/03/14 0656 10/03/14 0925   10/03/14 0700  cefTAZidime (FORTAZ) 2 g in dextrose 5 % 50 mL IVPB     2 g 100 mL/hr over 30 Minutes Intravenous  Once 10/03/14 0656 10/03/14 0827     Assessment: 34 yof presented to the ED with abdominal cramping. To start ceftazidime and vancomycin for possible peritonitis. Pt is on pertinoneal dialysis and recently completed a course of antibiotics. Planning to start with IV dosing and transition to IP. Pt is afebrile and WBC is elevated at 11.2. First doses ordered by MD.  Deniece Ree 9/19>> Ceftaz 9/19>>  Goal of Therapy:  Eradication of infection  Plan:  - Give additional 1gm vancomycin IV now for a total load of 2gm - Check a level in 3-4 days and re-dose once <20 - Ceftaz 500mg  IV Q24H - F/u renal plans, C&S, clinical status and levels PRN  Rumbarger, Rande Lawman 10/03/2014,9:49 AM

## 2014-10-03 NOTE — Discharge Instructions (Signed)
Please go to your appointment today with Dr Joelyn Oms, he will arrange for further antibiotic treatment.

## 2014-10-03 NOTE — ED Notes (Signed)
Dr. Horton at bedside. 

## 2014-10-03 NOTE — H&P (Signed)
Date: 10/03/2014               Patient Name:  Meghan Richardson MRN: [id number]  DOB: November 05, 1980 Age / Sex: 34 y.o., female   PCP: No Pcp Per Patient         Medical Service: Internal Medicine Teaching Service         Attending Physician: Dr. Carlyle Basques, MD    First Contact: Dr. Jule Ser Pager: [phone]  Second Contact: Dr. Dellia Nims Pager: [phone]       After Hours (After 5p/  First Contact Pager: [phone]  weekends / holidays): Second Contact Pager: [phone]   Chief Complaint: abdominal cramping  History of Present Illness: Meghan Richardson is a 34 year old female with PMH of ESRD 2/2 FSGS who recently started peritoneal dialysis 3 weeks ago.  She reports that yesterday she started to have some abdominal cramping similar to her menstrual cycle but was not due for her menses.  She reports that the cramping intensified this morning so she called her dialysis nurse who instructed her to come to the ED for evaluation for possible peritonitis.  She reports that her abdominal pain continued to intensify.  She denies any fever or chills.  She reports that besides her abdominal pain she feels like her normal healthy self.  In the ED peritoneal fluid was sent and was suggestive of infections and she was started on broad spectrum abx.  Her dialysis catheter was placed in April, she does report one previous episode of peritonitis which she reports was the nurses fault.  She notes that she has been doing everything right and she is not sure how she got this infection.  Dr Joelyn Oms is her nephrologist. Meds: Current Facility-Administered Medications  Medication Dose Route Frequency Provider Last Rate Last Dose  . vancomycin (VANCOCIN) IVPB 1000 mg/200 mL premix  1,000 mg Intravenous Once Merryl Hacker, MD 200 mL/hr at 10/03/14 0825 1,000 mg at 10/03/14 0825   Current Outpatient Prescriptions  Medication Sig Dispense Refill  . calcitRIOL (ROCALTROL) 0.5 MCG capsule Take 0.5 mcg by mouth  daily.    Marland Kitchen HYDROcodone-acetaminophen (NORCO/VICODIN) 5-325 MG per tablet Take 1 tablet by mouth every 6 (six) hours as needed. 10 tablet 0  . metoprolol tartrate (LOPRESSOR) 25 MG tablet Take 25 mg by mouth 2 (two) times daily.      Allergies: Allergies as of 10/02/2014  . (No Known Allergies)   Past Medical History  Diagnosis Date  . Hypertension   . FSGS (focal segmental glomerulosclerosis)     Reports Kidney Bx at age 47   History reviewed. No pertinent past surgical history. No family history on file. Social History   Social History  . Marital Status: Single    Spouse Name: N/A  . Number of Children: N/A  . Years of Education: N/A   Occupational History  . Not on file.   Social History Main Topics  . Smoking status: Current Some Day Smoker  . Smokeless tobacco: Not on file  . Alcohol Use: No  . Drug Use: No  . Sexual Activity: Not on file   Other Topics Concern  . Not on file   Social History Narrative    Review of Systems: Review of Systems  Constitutional: Negative for fever, chills, weight loss and malaise/fatigue.  Eyes: Negative for blurred vision.  Respiratory: Negative for cough and shortness of breath.   Cardiovascular: Negative for leg swelling.  Gastrointestinal: Positive for abdominal pain. Negative for  heartburn, nausea, vomiting, diarrhea and constipation.  Genitourinary: Negative for dysuria and frequency.  Musculoskeletal: Negative for back pain.  Neurological: Negative for dizziness and headaches.  Psychiatric/Behavioral: Negative for substance abuse.     Physical Exam: Blood pressure 115/77, pulse 80, temperature 98.1 F (36.7 C), temperature source Oral, resp. rate 18, height 5\' 7"  (1.702 m), weight 71.668 kg (158 lb), SpO2 99 %. Physical Exam  Constitutional: She is oriented to person, place, and time and well-developed, well-nourished, and in no distress.  HENT:  Head: Normocephalic and atraumatic.  Eyes: Conjunctivae are normal.    Cardiovascular: Normal rate and regular rhythm.   Pulmonary/Chest: Effort normal and breath sounds normal. No respiratory distress. She has no wheezes. She has no rales.  Abdominal: Soft. Bowel sounds are normal. She exhibits no distension. There is tenderness (mild tenderness in all quadrants). There is no guarding.  PD catheter in placed in RLQ  Musculoskeletal: She exhibits no edema.  Neurological: She is alert and oriented to person, place, and time.  Skin: Skin is warm and dry.  Psychiatric: Affect normal.  Nursing note and vitals reviewed.    Lab results: Basic Metabolic Panel:  Recent Labs  10/03/14 0242  NA 141  K 4.7  CL 109  CO2 23  GLUCOSE 88  BUN 58*  CREATININE 8.88*  CALCIUM 8.9   CBC:  Recent Labs  10/03/14 0242  WBC 11.2*  NEUTROABS 7.9*  HGB 9.8*  HCT 29.6*  MCV 76.5*  PLT 262   Peritoneal Fluid results Results for Meghan, Richardson (MRN [id number]) as of 10/03/2014 08:46  Ref. Range 10/03/2014 05:54  Color, Fluid Latest Ref Range: YELLOW  COLORLESS (A)  WBC, Fluid Latest Ref Range: 0-1000 cu mm 17000 (H)  Lymphs, Fluid Latest Units: % 1  Eos, Fluid Latest Units: % 1  Appearance, Fluid Latest Ref Range: CLEAR  TURBID (A)  Neutrophil Count, Fluid Latest Ref Range: 0-25 % 90 (H)  Monocyte-Macrophage-Serous Fluid Latest Ref Range: 50-90 % 8 (L)  Results for Meghan, Richardson (MRN [id number]) as of 10/03/2014 08:46  Ref. Range 10/03/2014 04:33  Albumin, Fluid Latest Units: g/dL <1.0  Fluid Type-FALB Unknown FLUID  Glucose, Peritoneal Fluid Latest Units: mg/dL 130  Fluid Type-FLDH Unknown FLUID  LD, Fluid Latest Ref Range: 3-23 U/L 96 (H)  Total protein, fluid Latest Units: g/dL <3.0   Imaging results:  No results found.  Other results: EKG: none  Assessment & Plan by Problem: 34 year old female on peritoneal dialysis presents with abdominal pain and found to have evidence of peritonitis.  Principal Problem:   Peritonitis associated with peritoneal  dialysis - Started on empiric IV broad spectrum antibiotics for G+ and G- coverage in the ED with Vancomycin and Ceftazidime.  Consult pharmacy for dosing - Blood cultures and Peritoneal cultures obtained, will follow for narrowing Abx.  Will add on Gram stain to peritoneal fluid. -Will continue IV Abx for 24-48 hours then can like narrow and transition to intraperitoneal - Nephrology has been consulted by ED provider for dialysis managment    Hypertension due to end stage renal disease on dialysis - Currently normotensive, will hold home metoprolol  Anemia 2/2 CKD - Appears stable, follow up with nephrologist.  Dispo: Disposition is deferred at this time, awaiting improvement of current medical problems. Anticipated discharge in approximately 1-2 day(s).   The patient does not have a current PCP (No Pcp Per Patient) and does not know need an [facility] hospital follow-up appointment after discharge.  The  patient does not have transportation limitations that hinder transportation to clinic appointments.  Signed: Lucious Groves, DO IMTS PGY-3 Pager: [phone] 10/03/2014, 8:42 AM

## 2014-10-03 NOTE — ED Provider Notes (Signed)
CSN: [id number]     Arrival date & time 10/02/14  2317 History  This chart was scribed for Merryl Hacker, MD by Ludger Nutting, ED Scribe. This patient was seen in room A10C/A10C and the patient's care was started 2:27 AM.    Chief Complaint  Patient presents with  . Subacute Bacterial Peritonitis   The history is provided by the patient. No language interpreter was used.     HPI Comments: Meghan Richardson is a 34 y.o. female who presents to the Emergency Department complaining of constant, gradually worsened generalized abdominal pain that began 1 day ago. She describes her pain as aching and rates it as moderate to severe. Rates it 10 out of 10. She receives peritoneal dialysis and noticed cloudy fluid today. She spoke with her dialysis nurse who advised her to add 1000 cc of fluid before arrival. She has had peritonitis in the past and reports finishing a course of antibiotics 2 weeks ago. She denies fever, dysuria.   Does report that she required iron transfusion on Thursday. She states that after that she had some mild abdominal cramping and pain that she related to constipation. She finally had a hard bowel movement on Saturday. Patient denies any vomiting or any associated symptoms.  Past Medical History  Diagnosis Date  . Hypertension   . FSGS (focal segmental glomerulosclerosis)    History reviewed. No pertinent past surgical history. No family history on file. Social History  Substance Use Topics  . Smoking status: Current Some Day Smoker  . Smokeless tobacco: None  . Alcohol Use: No   OB History    No data available     Review of Systems  Constitutional: Negative for fever.  Respiratory: Negative for chest tightness and shortness of breath.   Cardiovascular: Negative for chest pain.  Gastrointestinal: Positive for abdominal pain and constipation. Negative for nausea and vomiting.  Genitourinary: Negative for dysuria.  Neurological: Negative for headaches.   Psychiatric/Behavioral: Negative for confusion.  All other systems reviewed and are negative.     Allergies  Review of patient's allergies indicates no known allergies.  Home Medications   Prior to Admission medications   Medication Sig Start Date End Date Taking? Authorizing Provider  calcitRIOL (ROCALTROL) 0.5 MCG capsule Take 0.5 mcg by mouth daily.    Historical Provider, MD  HYDROcodone-acetaminophen (NORCO/VICODIN) 5-325 MG per tablet Take 1 tablet by mouth every 6 (six) hours as needed. 07/22/14   Varney Biles, MD  metoprolol tartrate (LOPRESSOR) 25 MG tablet Take 25 mg by mouth 2 (two) times daily.    Historical Provider, MD   BP 117/80 mmHg  Pulse 77  Temp(Src) 98.1 F (36.7 C) (Oral)  Resp 18  Ht 5\' 7"  (1.702 m)  Wt 158 lb (71.668 kg)  BMI 24.74 kg/m2  SpO2 98% Physical Exam  Constitutional: She is oriented to person, place, and time. She appears well-developed and well-nourished. No distress.  HENT:  Head: Normocephalic and atraumatic.  Cardiovascular: Normal rate, regular rhythm and normal heart sounds.   Pulmonary/Chest: Effort normal and breath sounds normal. No respiratory distress. She has no wheezes.  Abdominal: Soft. Bowel sounds are normal. She exhibits distension. There is tenderness. There is no rebound and no guarding.  Mild distention with diffuse tenderness to palpation, no rebound or guarding, PD catheter in left midabdomen  Neurological: She is alert and oriented to person, place, and time.  Skin: Skin is warm and dry.  Psychiatric: She has a normal mood and affect.  Nursing note and vitals reviewed.   ED Course  Procedures (including critical care time)  DIAGNOSTIC STUDIES: Oxygen Saturation is 98% on RA, normal by my interpretation.    COORDINATION OF CARE: 2:32 AM Discussed treatment plan with pt at bedside and pt agreed to plan.   Labs Review Labs Reviewed  CBC WITH DIFFERENTIAL/PLATELET - Abnormal; Notable for the following:    WBC  11.2 (*)    Hemoglobin 9.8 (*)    HCT 29.6 (*)    MCV 76.5 (*)    MCH 25.3 (*)    Neutro Abs 7.9 (*)    All other components within normal limits  BASIC METABOLIC PANEL - Abnormal; Notable for the following:    BUN 58 (*)    Creatinine, Ser 8.88 (*)    GFR calc non Af Amer 5 (*)    GFR calc Af Amer 6 (*)    All other components within normal limits  LACTATE DEHYDROGENASE, BODY FLUID - Abnormal; Notable for the following:    LD, Fluid 96 (*)    All other components within normal limits  BODY FLUID CELL COUNT WITH DIFFERENTIAL - Abnormal; Notable for the following:    Color, Fluid COLORLESS (*)    Appearance, Fluid TURBID (*)    WBC, Fluid 17000 (*)    Neutrophil Count, Fluid 90 (*)    Monocyte-Macrophage-Serous Fluid 8 (*)    All other components within normal limits  BODY FLUID CULTURE  CULTURE, BLOOD (ROUTINE X 2)  CULTURE, BLOOD (ROUTINE X 2)  GRAM STAIN  GLUCOSE, PERITONEAL FLUID  PROTEIN, BODY FLUID  ALBUMIN, FLUID  LACTIC ACID, PLASMA    Imaging Review No results found. I have personally reviewed and evaluated these images and lab results as part of my medical decision-making.   EKG Interpretation None      MDM   Final diagnoses:  SBP (spontaneous bacterial peritonitis)    Patient presents with concerns for SBP. Reports abdominal pain and cloudy fluid.  Is otherwise nontoxic. Afebrile. Fluid obtained from peritoneal cavity by dialysis nurse. Patient given pain medication. Mild leukocytosis.  Fluid with 17,000 white cells and an elevated LDH. Patient was given vancomycin and Tressie Ellis per nephrology consultation. Nephrology is consulted and aware. Will admit patient to medicine for further management for SBP.  I personally performed the services described in this documentation, which was scribed in my presence. The recorded information has been reviewed and is accurate.   Merryl Hacker, MD 10/03/14 [phone]

## 2014-10-03 NOTE — Progress Notes (Signed)
Patient is a patient of [facility].  Called to ED to drain peritoneal fluid and send sample to lab.  Patient had put in 1000 ml prior to arrival to ED.  Drained 300cc of cloudy yellow fluid.  Patient stated she felt empty and wanted to be broken off.  She tolerated procedure well with no complaints.  ED MD made aware of amount drained off.  Peritoneal fluid taken to lab as ordered.  Earleen Reaper RN-BC, [employer]

## 2014-10-04 LAB — PATHOLOGIST SMEAR REVIEW

## 2014-10-05 LAB — GRAM STAIN

## 2014-10-08 LAB — CULTURE, BLOOD (ROUTINE X 2)
Culture: NO GROWTH
Culture: NO GROWTH

## 2014-10-09 LAB — CULTURE, BODY FLUID-BOTTLE: CULTURE: NO GROWTH

## 2014-10-09 LAB — CULTURE, BODY FLUID W GRAM STAIN -BOTTLE

## 2014-10-20 ENCOUNTER — Other Ambulatory Visit: Payer: Self-pay | Admitting: Nephrology

## 2014-10-20 ENCOUNTER — Ambulatory Visit
Admission: RE | Admit: 2014-10-20 | Discharge: 2014-10-20 | Disposition: A | Discharge status: Home / Self Care | Payer: Medicaid Other | Source: Ambulatory Visit | Attending: Nephrology | Admitting: Nephrology

## 2014-10-20 DIAGNOSIS — T85611D Breakdown (mechanical) of intraperitoneal dialysis catheter, subsequent encounter: Secondary | ICD-10-CM

## 2014-10-22 ENCOUNTER — Emergency Department (HOSPITAL_COMMUNITY): Payer: Medicaid Other

## 2014-10-22 ENCOUNTER — Emergency Department (HOSPITAL_COMMUNITY)
Admission: EM | Admit: 2014-10-22 | Discharge: 2014-10-22 | Disposition: A | Discharge status: Home / Self Care | Payer: Medicaid Other | Attending: Emergency Medicine | Admitting: Emergency Medicine

## 2014-10-22 ENCOUNTER — Encounter (HOSPITAL_COMMUNITY): Payer: Self-pay | Admitting: *Deleted

## 2014-10-22 DIAGNOSIS — I10 Essential (primary) hypertension: Secondary | ICD-10-CM | POA: Diagnosis not present

## 2014-10-22 DIAGNOSIS — Z3202 Encounter for pregnancy test, result negative: Secondary | ICD-10-CM | POA: Diagnosis not present

## 2014-10-22 DIAGNOSIS — Z87891 Personal history of nicotine dependence: Secondary | ICD-10-CM | POA: Diagnosis not present

## 2014-10-22 DIAGNOSIS — Z79899 Other long term (current) drug therapy: Secondary | ICD-10-CM | POA: Insufficient documentation

## 2014-10-22 DIAGNOSIS — R519 Headache, unspecified: Secondary | ICD-10-CM

## 2014-10-22 DIAGNOSIS — Z87448 Personal history of other diseases of urinary system: Secondary | ICD-10-CM | POA: Diagnosis not present

## 2014-10-22 DIAGNOSIS — R51 Headache: Secondary | ICD-10-CM | POA: Insufficient documentation

## 2014-10-22 LAB — COMPREHENSIVE METABOLIC PANEL
ALBUMIN: 2.8 g/dL — AB (ref 3.5–5.0)
ALK PHOS: 73 U/L (ref 38–126)
ALT: 51 U/L (ref 14–54)
ANION GAP: 10 (ref 5–15)
AST: 39 U/L (ref 15–41)
BUN: 15 mg/dL (ref 6–20)
CALCIUM: 8.3 mg/dL — AB (ref 8.9–10.3)
CHLORIDE: 98 mmol/L — AB (ref 101–111)
CO2: 29 mmol/L (ref 22–32)
Creatinine, Ser: 4.69 mg/dL — ABNORMAL HIGH (ref 0.44–1.00)
GFR calc non Af Amer: 11 mL/min — ABNORMAL LOW (ref >60)
GFR, EST AFRICAN AMERICAN: 13 mL/min — AB (ref >60)
GLUCOSE: 92 mg/dL (ref 65–99)
POTASSIUM: 3.9 mmol/L (ref 3.5–5.1)
SODIUM: 137 mmol/L (ref 135–145)
Total Bilirubin: 0.7 mg/dL (ref 0.3–1.2)
Total Protein: 6.1 g/dL — ABNORMAL LOW (ref 6.5–8.1)

## 2014-10-22 LAB — CBC WITH DIFFERENTIAL/PLATELET
BASOS PCT: 0 %
Basophils Absolute: 0 10*3/uL (ref 0.0–0.1)
Eosinophils Absolute: 0 10*3/uL (ref 0.0–0.7)
Eosinophils Relative: 0 %
HEMATOCRIT: 32.3 % — AB (ref 36.0–46.0)
HEMOGLOBIN: 10.5 g/dL — AB (ref 12.0–15.0)
LYMPHS PCT: 7 %
Lymphs Abs: 0.7 10*3/uL (ref 0.7–4.0)
MCH: 25.7 pg — ABNORMAL LOW (ref 26.0–34.0)
MCHC: 32.5 g/dL (ref 30.0–36.0)
MCV: 79.2 fL (ref 78.0–100.0)
MONOS PCT: 5 %
Monocytes Absolute: 0.5 10*3/uL (ref 0.1–1.0)
NEUTROS ABS: 8.4 10*3/uL — AB (ref 1.7–7.7)
NEUTROS PCT: 88 %
Platelets: 137 10*3/uL — ABNORMAL LOW (ref 150–400)
RBC: 4.08 MIL/uL (ref 3.87–5.11)
RDW: 15.7 % — ABNORMAL HIGH (ref 11.5–15.5)
WBC: 9.5 10*3/uL (ref 4.0–10.5)

## 2014-10-22 LAB — I-STAT BETA HCG BLOOD, ED (MC, WL, AP ONLY): I-stat hCG, quantitative: <5 m[IU]/mL (ref <5)

## 2014-10-22 MED ORDER — LISINOPRIL 10 MG PO TABS
10.0000 mg | ORAL_TABLET | Freq: Once | ORAL | Status: AC
  Administered 2014-10-22: 10 mg via ORAL
  Filled 2014-10-22: qty 1

## 2014-10-22 MED ORDER — ACETAMINOPHEN 500 MG PO TABS
1000.0000 mg | ORAL_TABLET | Freq: Once | ORAL | Status: AC
  Administered 2014-10-22: 1000 mg via ORAL
  Filled 2014-10-22: qty 2

## 2014-10-22 MED ORDER — METOPROLOL TARTRATE 25 MG PO TABS
25.0000 mg | ORAL_TABLET | Freq: Once | ORAL | Status: AC
  Administered 2014-10-22: 25 mg via ORAL
  Filled 2014-10-22: qty 1

## 2014-10-22 MED ORDER — PROCHLORPERAZINE EDISYLATE 5 MG/ML IJ SOLN
10.0000 mg | Freq: Once | INTRAMUSCULAR | Status: AC
  Administered 2014-10-22: 10 mg via INTRAVENOUS
  Filled 2014-10-22: qty 2

## 2014-10-22 MED ORDER — DIPHENHYDRAMINE HCL 50 MG/ML IJ SOLN
25.0000 mg | Freq: Once | INTRAMUSCULAR | Status: AC
  Administered 2014-10-22: 25 mg via INTRAVENOUS
  Filled 2014-10-22: qty 1

## 2014-10-22 NOTE — ED Provider Notes (Signed)
CSN: [id number]     Arrival date & time 10/22/14  1532 History   First MD Initiated Contact with Patient 10/22/14 1538     Chief Complaint  Patient presents with  . Headache     (Consider location/radiation/quality/duration/timing/severity/associated sxs/prior Treatment) HPI Comments: 34 year old female with past medical history including FSGS on HD, hypertension who presents with headache. The patient states that she has had a mild, gradually worsening, intermittent headache for the past 2 weeks. The headache has never been sudden in onset. She denies any associated visual changes, extremity numbness/weakness, or difficulty ambulating. She had her last peritoneal dialysis session several days ago and had a HD catheter placed yesterday. She began having a mild headache during her first dialysis session today, associated with photophobia. She states that she began "feeling weird" and had an episode of vomiting. She was sent here for further care. She states that her headache is more severe now. She denies any fevers, abdominal pain, rash, or recent illness. No vaginal bleeding or discharge. No chest pain or shortness of breath.  The history is provided by the patient.    Past Medical History  Diagnosis Date  . Hypertension   . FSGS (focal segmental glomerulosclerosis)     Reports Kidney Bx at age 51   History reviewed. No pertinent past surgical history. Family History  Problem Relation Age of Onset  . Kidney disease Brother     FSGS   Social History  Substance Use Topics  . Smoking status: Former Research scientist (life sciences)  . Smokeless tobacco: None  . Alcohol Use: No   OB History    No data available     Review of Systems 10 Systems reviewed and are negative for acute change except as noted in the HPI.    Allergies  Review of patient's allergies indicates no known allergies.  Home Medications   Prior to Admission medications   Medication Sig Start Date End Date Taking? Authorizing  Provider  lisinopril (PRINIVIL,ZESTRIL) 10 MG tablet Take 10 mg by mouth 2 (two) times daily.   Yes Historical Provider, MD  senna (SENOKOT) 8.6 MG tablet Take 1 tablet by mouth daily as needed for constipation.   Yes Historical Provider, MD  HYDROcodone-acetaminophen (NORCO/VICODIN) 5-325 MG per tablet Take 1 tablet by mouth every 6 (six) hours as needed. 07/22/14   Ankit Nanavati, MD   BP 189/115 mmHg  Pulse 61  Temp(Src) 98.3 F (36.8 C) (Oral)  Resp 18  SpO2 98% Physical Exam  Constitutional: She is oriented to person, place, and time. She appears well-developed and well-nourished. No distress.  Awake, alert  HENT:  Head: Normocephalic and atraumatic.  Eyes: Conjunctivae and EOM are normal. Pupils are equal, round, and reactive to light.  Neck: Neck supple.  Cardiovascular: Normal rate, regular rhythm and normal heart sounds.   No murmur heard. Pulmonary/Chest: Effort normal and breath sounds normal. No respiratory distress.  Vas-Cath in the right upper chest with no surrounding erythema or drainage  Abdominal: Soft. Bowel sounds are normal. She exhibits no distension.  Musculoskeletal: She exhibits no edema.  Neurological: She is alert and oriented to person, place, and time. She has normal reflexes. No cranial nerve deficit. She exhibits normal muscle tone.  Fluent speech, normal finger-to-nose testing, negative pronator drift  Skin: Skin is warm and dry.  Psychiatric: She has a normal mood and affect. Judgment and thought content normal.  Nursing note and vitals reviewed.   ED Course  Procedures (including critical care time) Labs Review  Labs Reviewed  COMPREHENSIVE METABOLIC PANEL - Abnormal; Notable for the following:    Chloride 98 (*)    Creatinine, Ser 4.69 (*)    Calcium 8.3 (*)    Total Protein 6.1 (*)    Albumin 2.8 (*)    GFR calc non Af Amer 11 (*)    GFR calc Af Amer 13 (*)    All other components within normal limits  CBC WITH DIFFERENTIAL/PLATELET -  Abnormal; Notable for the following:    Hemoglobin 10.5 (*)    HCT 32.3 (*)    MCH 25.7 (*)    RDW 15.7 (*)    Platelets 137 (*)    Neutro Abs 8.4 (*)    All other components within normal limits  I-STAT BETA HCG BLOOD, ED ([facility], [facility], [facility] ONLY)    Imaging Review Ct Head Wo Contrast  10/22/2014   CLINICAL DATA:  Chronic increasing headaches, severe today.  EXAM: CT HEAD WITHOUT CONTRAST  TECHNIQUE: Contiguous axial images were obtained from the base of the skull through the vertex without intravenous contrast.  COMPARISON:  None.  FINDINGS: No mass lesion. No midline shift. No acute hemorrhage or hematoma. No extra-axial fluid collections. No evidence of acute infarction. Brain parenchyma is normal. Osseous structures are normal.  IMPRESSION: Normal exam.   Electronically Signed   By: Lorriane Shire M.D.   On: 10/22/2014 17:49   I have personally reviewed and evaluated these lab results as part of my medical decision-making.   EKG Interpretation   Date/Time:  Saturday October 22 2014 16:08:18 EDT Ventricular Rate:  61 PR Interval:  144 QRS Duration: 87 QT Interval:  437 QTC Calculation: 440 R Axis:   65 Text Interpretation:  Sinus rhythm RSR' in V1 or V2, right VCD or RVH  agree with above No previous ECGs available Confirmed by LITTLE MD, RACHEL  [id number]) on 10/22/2014 4:22:25 PM     Medications  diphenhydrAMINE (BENADRYL) injection 25 mg (25 mg Intravenous Given 10/22/14 1636)  prochlorperazine (COMPAZINE) injection 10 mg (10 mg Intravenous Given 10/22/14 1637)  lisinopril (PRINIVIL,ZESTRIL) tablet 10 mg (10 mg Oral Given 10/22/14 1852)  metoprolol tartrate (LOPRESSOR) tablet 25 mg (25 mg Oral Given 10/22/14 1852)  acetaminophen (TYLENOL) tablet 1,000 mg (1,000 mg Oral Given 10/22/14 2025)    MDM   Final diagnoses:  Acute nonintractable headache, unspecified headache type  Essential hypertension    34 year old female with ESRD and recent transition from PD to HD as well as  hypertension who presents with intermittent mild headaches for the past few weeks and an episode of vomiting that occurred during HD today. On arrival, the patient was awake, alert, uncomfortable but in no acute distress. Vital signs notable for hypertension at 180/105. No neurologic deficits on exam. Given chronicity of headache and the fact that the patient denies history of migraines, obtained a CT of the head to rule out bleed or mass. Obtained basic labs listed above which showed a normal potassium and reassuring CBC. CT was negative for acute process. Gave the patient Benadryl, Compazine, and Tylenol and later gave her her evening blood pressure medications. On reexamination, the patient stated that her HA was improved. Her normal neurologic exam and head CT are reassuring against life-threatening cause of headache. Discussed supportive care and emphasized the importance of close PCP follow-up for blood pressure medication readjustment.  The patient denies any neurologic symptoms such as visual changes, focal numbness/weakness, balance problems, confusion, or speech difficulty to suggest a life-threatening intracranial  process such as intracranial hemorrhage or mass. I have reviewed return precautions including development of neurologic symptoms, confusion, lethargy, or difficulty speaking and patient has voiced understanding. Patient discharged in satisfactory condition.  Sharlett Iles, MD 10/23/14 [phone]

## 2014-10-22 NOTE — Discharge Instructions (Signed)
General Headache Without Cause A headache is pain or discomfort felt around the head or neck area. The specific cause of a headache may not be found. There are many causes and types of headaches. A few common ones are:  Tension headaches.  Migraine headaches.  Cluster headaches.  Chronic daily headaches. HOME CARE INSTRUCTIONS  Watch your condition for any changes. Take these steps to help with your condition: Managing Pain  Take over-the-counter and prescription medicines only as told by your health care provider.  Lie down in a dark, quiet room when you have a headache.  If directed, apply ice to the head and neck area:  Put ice in a plastic bag.  Place a towel between your skin and the bag.  Leave the ice on for 20 minutes, 2-3 times per day.  Use a heating pad or hot shower to apply heat to the head and neck area as told by your health care provider.  Keep lights dim if bright lights bother you or make your headaches worse. Eating and Drinking  Eat meals on a regular schedule.  Limit alcohol use.  Decrease the amount of caffeine you drink, or stop drinking caffeine. General Instructions  Keep all follow-up visits as told by your health care provider. This is important.  Keep a headache journal to help find out what may trigger your headaches. For example, write down:  What you eat and drink.  How much sleep you get.  Any change to your diet or medicines.  Try massage or other relaxation techniques.  Limit stress.  Sit up straight, and do not tense your muscles.  Do not use tobacco products, including cigarettes, chewing tobacco, or e-cigarettes. If you need help quitting, ask your health care provider.  Exercise regularly as told by your health care provider.  Sleep on a regular schedule. Get 7-9 hours of sleep, or the amount recommended by your health care provider. SEEK MEDICAL CARE IF:   Your symptoms are not helped by medicine.  You have a  headache that is different from the usual headache.  You have nausea or you vomit.  You have a fever. SEEK IMMEDIATE MEDICAL CARE IF:   Your headache becomes severe.  You have repeated vomiting.  You have a stiff neck.  You have a loss of vision.  You have problems with speech.  You have pain in the eye or ear.  You have muscular weakness or loss of muscle control.  You lose your balance or have trouble walking.  You feel faint or pass out.  You have confusion.   This information is not intended to replace advice given to you by your health care provider. Make sure you discuss any questions you have with your health care provider.   Document Released: 12/31/2004 Document Revised: 09/21/2014 Document Reviewed: 04/25/2014 Elsevier Interactive Patient Education 2016 [employer]  **IT IS VERY IMPORTANT FOR YOU TO FOLLOW UP WITH YOUR PRIMARY CARE PROVIDER THIS WEEK TO ADJUST BLOOD PRESSURE MEDICATIONS. PLEASE SEEK IMMEDIATE MEDICAL ATTENTION IF YOU DEVELOP VISUAL CHANGES, SUDDEN CHANGE IN HEADACHE, NUMBNESS/WEAKNESS, FEVER, OR ANY OTHER NEW OR ALARMING SYMPTOMS.**

## 2014-10-22 NOTE — ED Notes (Signed)
Pt arrives from dialysis via [facility]. Pt normally does peritoneal dialysis at home, but rt new onset of sx pt is now having hemodialysis. Pt states she has had a ha x2 wks. Pt states today during her first hemodialysis she began "feeling weird" and couldn't tolerate but 2.5 hours as opposed to the 4 hours. Pt states she began having n/v.

## 2014-10-22 NOTE — ED Notes (Signed)
Patient transported to CT 

## 2015-04-13 ENCOUNTER — Other Ambulatory Visit (HOSPITAL_COMMUNITY): Payer: Self-pay | Admitting: Nephrology

## 2015-04-13 DIAGNOSIS — Z992 Dependence on renal dialysis: Principal | ICD-10-CM

## 2015-04-13 DIAGNOSIS — N186 End stage renal disease: Secondary | ICD-10-CM

## 2015-04-20 ENCOUNTER — Ambulatory Visit (HOSPITAL_COMMUNITY)
Admission: RE | Admit: 2015-04-20 | Discharge: 2015-04-20 | Disposition: A | Discharge status: Home / Self Care | Payer: Medicaid Other | Source: Ambulatory Visit | Attending: Nephrology | Admitting: Nephrology

## 2015-04-20 DIAGNOSIS — Z452 Encounter for adjustment and management of vascular access device: Secondary | ICD-10-CM | POA: Diagnosis not present

## 2015-04-20 DIAGNOSIS — N186 End stage renal disease: Secondary | ICD-10-CM

## 2015-04-20 DIAGNOSIS — Z992 Dependence on renal dialysis: Secondary | ICD-10-CM | POA: Insufficient documentation

## 2015-04-20 DIAGNOSIS — Z4901 Encounter for fitting and adjustment of extracorporeal dialysis catheter: Secondary | ICD-10-CM | POA: Insufficient documentation

## 2015-04-20 MED ORDER — LIDOCAINE HCL 1 % IJ SOLN
INTRAMUSCULAR | Status: AC
  Filled 2015-04-20: qty 20

## 2015-04-20 NOTE — Procedures (Signed)
RIJV HD catheter removal No comp

## 2015-05-15 DIAGNOSIS — Z4932 Encounter for adequacy testing for peritoneal dialysis: Secondary | ICD-10-CM | POA: Diagnosis not present

## 2015-05-15 DIAGNOSIS — N2581 Secondary hyperparathyroidism of renal origin: Secondary | ICD-10-CM | POA: Diagnosis not present

## 2015-05-15 DIAGNOSIS — N186 End stage renal disease: Secondary | ICD-10-CM | POA: Diagnosis not present

## 2015-05-15 DIAGNOSIS — D509 Iron deficiency anemia, unspecified: Secondary | ICD-10-CM | POA: Diagnosis not present

## 2015-05-15 DIAGNOSIS — D631 Anemia in chronic kidney disease: Secondary | ICD-10-CM | POA: Diagnosis not present

## 2015-05-15 DIAGNOSIS — Z79899 Other long term (current) drug therapy: Secondary | ICD-10-CM | POA: Diagnosis not present

## 2015-05-15 DIAGNOSIS — R17 Unspecified jaundice: Secondary | ICD-10-CM | POA: Diagnosis not present

## 2015-05-15 DIAGNOSIS — E440 Moderate protein-calorie malnutrition: Secondary | ICD-10-CM | POA: Diagnosis not present

## 2015-05-16 DIAGNOSIS — D509 Iron deficiency anemia, unspecified: Secondary | ICD-10-CM | POA: Diagnosis not present

## 2015-05-16 DIAGNOSIS — N186 End stage renal disease: Secondary | ICD-10-CM | POA: Diagnosis not present

## 2015-05-16 DIAGNOSIS — D631 Anemia in chronic kidney disease: Secondary | ICD-10-CM | POA: Diagnosis not present

## 2015-05-16 DIAGNOSIS — Z79899 Other long term (current) drug therapy: Secondary | ICD-10-CM | POA: Diagnosis not present

## 2015-05-16 DIAGNOSIS — Z4932 Encounter for adequacy testing for peritoneal dialysis: Secondary | ICD-10-CM | POA: Diagnosis not present

## 2015-05-16 DIAGNOSIS — N2581 Secondary hyperparathyroidism of renal origin: Secondary | ICD-10-CM | POA: Diagnosis not present

## 2015-05-17 DIAGNOSIS — R8299 Other abnormal findings in urine: Secondary | ICD-10-CM | POA: Diagnosis not present

## 2015-05-17 DIAGNOSIS — N186 End stage renal disease: Secondary | ICD-10-CM | POA: Diagnosis not present

## 2015-05-17 DIAGNOSIS — Z4932 Encounter for adequacy testing for peritoneal dialysis: Secondary | ICD-10-CM | POA: Diagnosis not present

## 2015-05-17 DIAGNOSIS — N2581 Secondary hyperparathyroidism of renal origin: Secondary | ICD-10-CM | POA: Diagnosis not present

## 2015-05-17 DIAGNOSIS — Z79899 Other long term (current) drug therapy: Secondary | ICD-10-CM | POA: Diagnosis not present

## 2015-05-17 DIAGNOSIS — D509 Iron deficiency anemia, unspecified: Secondary | ICD-10-CM | POA: Diagnosis not present

## 2015-05-17 DIAGNOSIS — D631 Anemia in chronic kidney disease: Secondary | ICD-10-CM | POA: Diagnosis not present

## 2015-05-18 DIAGNOSIS — D509 Iron deficiency anemia, unspecified: Secondary | ICD-10-CM | POA: Diagnosis not present

## 2015-05-18 DIAGNOSIS — Z4932 Encounter for adequacy testing for peritoneal dialysis: Secondary | ICD-10-CM | POA: Diagnosis not present

## 2015-05-18 DIAGNOSIS — D631 Anemia in chronic kidney disease: Secondary | ICD-10-CM | POA: Diagnosis not present

## 2015-05-18 DIAGNOSIS — N186 End stage renal disease: Secondary | ICD-10-CM | POA: Diagnosis not present

## 2015-05-18 DIAGNOSIS — Z79899 Other long term (current) drug therapy: Secondary | ICD-10-CM | POA: Diagnosis not present

## 2015-05-18 DIAGNOSIS — N2581 Secondary hyperparathyroidism of renal origin: Secondary | ICD-10-CM | POA: Diagnosis not present

## 2015-05-19 DIAGNOSIS — D631 Anemia in chronic kidney disease: Secondary | ICD-10-CM | POA: Diagnosis not present

## 2015-05-19 DIAGNOSIS — N2581 Secondary hyperparathyroidism of renal origin: Secondary | ICD-10-CM | POA: Diagnosis not present

## 2015-05-19 DIAGNOSIS — D509 Iron deficiency anemia, unspecified: Secondary | ICD-10-CM | POA: Diagnosis not present

## 2015-05-19 DIAGNOSIS — Z79899 Other long term (current) drug therapy: Secondary | ICD-10-CM | POA: Diagnosis not present

## 2015-05-19 DIAGNOSIS — Z4932 Encounter for adequacy testing for peritoneal dialysis: Secondary | ICD-10-CM | POA: Diagnosis not present

## 2015-05-19 DIAGNOSIS — N186 End stage renal disease: Secondary | ICD-10-CM | POA: Diagnosis not present

## 2015-05-20 DIAGNOSIS — D509 Iron deficiency anemia, unspecified: Secondary | ICD-10-CM | POA: Diagnosis not present

## 2015-05-20 DIAGNOSIS — N2581 Secondary hyperparathyroidism of renal origin: Secondary | ICD-10-CM | POA: Diagnosis not present

## 2015-05-20 DIAGNOSIS — Z79899 Other long term (current) drug therapy: Secondary | ICD-10-CM | POA: Diagnosis not present

## 2015-05-20 DIAGNOSIS — N186 End stage renal disease: Secondary | ICD-10-CM | POA: Diagnosis not present

## 2015-05-20 DIAGNOSIS — D631 Anemia in chronic kidney disease: Secondary | ICD-10-CM | POA: Diagnosis not present

## 2015-05-20 DIAGNOSIS — Z4932 Encounter for adequacy testing for peritoneal dialysis: Secondary | ICD-10-CM | POA: Diagnosis not present

## 2015-05-21 DIAGNOSIS — Z79899 Other long term (current) drug therapy: Secondary | ICD-10-CM | POA: Diagnosis not present

## 2015-05-21 DIAGNOSIS — D631 Anemia in chronic kidney disease: Secondary | ICD-10-CM | POA: Diagnosis not present

## 2015-05-21 DIAGNOSIS — N2581 Secondary hyperparathyroidism of renal origin: Secondary | ICD-10-CM | POA: Diagnosis not present

## 2015-05-21 DIAGNOSIS — N186 End stage renal disease: Secondary | ICD-10-CM | POA: Diagnosis not present

## 2015-05-21 DIAGNOSIS — Z4932 Encounter for adequacy testing for peritoneal dialysis: Secondary | ICD-10-CM | POA: Diagnosis not present

## 2015-05-21 DIAGNOSIS — D509 Iron deficiency anemia, unspecified: Secondary | ICD-10-CM | POA: Diagnosis not present

## 2015-05-22 DIAGNOSIS — Z4932 Encounter for adequacy testing for peritoneal dialysis: Secondary | ICD-10-CM | POA: Diagnosis not present

## 2015-05-22 DIAGNOSIS — D509 Iron deficiency anemia, unspecified: Secondary | ICD-10-CM | POA: Diagnosis not present

## 2015-05-22 DIAGNOSIS — D631 Anemia in chronic kidney disease: Secondary | ICD-10-CM | POA: Diagnosis not present

## 2015-05-22 DIAGNOSIS — N186 End stage renal disease: Secondary | ICD-10-CM | POA: Diagnosis not present

## 2015-05-22 DIAGNOSIS — N2581 Secondary hyperparathyroidism of renal origin: Secondary | ICD-10-CM | POA: Diagnosis not present

## 2015-05-22 DIAGNOSIS — Z79899 Other long term (current) drug therapy: Secondary | ICD-10-CM | POA: Diagnosis not present

## 2015-05-23 DIAGNOSIS — N186 End stage renal disease: Secondary | ICD-10-CM | POA: Diagnosis not present

## 2015-05-23 DIAGNOSIS — Z4932 Encounter for adequacy testing for peritoneal dialysis: Secondary | ICD-10-CM | POA: Diagnosis not present

## 2015-05-23 DIAGNOSIS — D631 Anemia in chronic kidney disease: Secondary | ICD-10-CM | POA: Diagnosis not present

## 2015-05-23 DIAGNOSIS — Z79899 Other long term (current) drug therapy: Secondary | ICD-10-CM | POA: Diagnosis not present

## 2015-05-23 DIAGNOSIS — D509 Iron deficiency anemia, unspecified: Secondary | ICD-10-CM | POA: Diagnosis not present

## 2015-05-23 DIAGNOSIS — N2581 Secondary hyperparathyroidism of renal origin: Secondary | ICD-10-CM | POA: Diagnosis not present

## 2015-05-24 DIAGNOSIS — D509 Iron deficiency anemia, unspecified: Secondary | ICD-10-CM | POA: Diagnosis not present

## 2015-05-24 DIAGNOSIS — Z4932 Encounter for adequacy testing for peritoneal dialysis: Secondary | ICD-10-CM | POA: Diagnosis not present

## 2015-05-24 DIAGNOSIS — N186 End stage renal disease: Secondary | ICD-10-CM | POA: Diagnosis not present

## 2015-05-24 DIAGNOSIS — N2581 Secondary hyperparathyroidism of renal origin: Secondary | ICD-10-CM | POA: Diagnosis not present

## 2015-05-24 DIAGNOSIS — Z79899 Other long term (current) drug therapy: Secondary | ICD-10-CM | POA: Diagnosis not present

## 2015-05-24 DIAGNOSIS — D631 Anemia in chronic kidney disease: Secondary | ICD-10-CM | POA: Diagnosis not present

## 2015-05-25 DIAGNOSIS — N186 End stage renal disease: Secondary | ICD-10-CM | POA: Diagnosis not present

## 2015-05-25 DIAGNOSIS — Z79899 Other long term (current) drug therapy: Secondary | ICD-10-CM | POA: Diagnosis not present

## 2015-05-25 DIAGNOSIS — Z4932 Encounter for adequacy testing for peritoneal dialysis: Secondary | ICD-10-CM | POA: Diagnosis not present

## 2015-05-25 DIAGNOSIS — D631 Anemia in chronic kidney disease: Secondary | ICD-10-CM | POA: Diagnosis not present

## 2015-05-25 DIAGNOSIS — N2581 Secondary hyperparathyroidism of renal origin: Secondary | ICD-10-CM | POA: Diagnosis not present

## 2015-05-25 DIAGNOSIS — D509 Iron deficiency anemia, unspecified: Secondary | ICD-10-CM | POA: Diagnosis not present

## 2015-05-26 DIAGNOSIS — D509 Iron deficiency anemia, unspecified: Secondary | ICD-10-CM | POA: Diagnosis not present

## 2015-05-26 DIAGNOSIS — N186 End stage renal disease: Secondary | ICD-10-CM | POA: Diagnosis not present

## 2015-05-26 DIAGNOSIS — D631 Anemia in chronic kidney disease: Secondary | ICD-10-CM | POA: Diagnosis not present

## 2015-05-26 DIAGNOSIS — Z4932 Encounter for adequacy testing for peritoneal dialysis: Secondary | ICD-10-CM | POA: Diagnosis not present

## 2015-05-26 DIAGNOSIS — Z79899 Other long term (current) drug therapy: Secondary | ICD-10-CM | POA: Diagnosis not present

## 2015-05-26 DIAGNOSIS — N2581 Secondary hyperparathyroidism of renal origin: Secondary | ICD-10-CM | POA: Diagnosis not present

## 2015-05-27 DIAGNOSIS — N2581 Secondary hyperparathyroidism of renal origin: Secondary | ICD-10-CM | POA: Diagnosis not present

## 2015-05-27 DIAGNOSIS — Z4932 Encounter for adequacy testing for peritoneal dialysis: Secondary | ICD-10-CM | POA: Diagnosis not present

## 2015-05-27 DIAGNOSIS — Z79899 Other long term (current) drug therapy: Secondary | ICD-10-CM | POA: Diagnosis not present

## 2015-05-27 DIAGNOSIS — D509 Iron deficiency anemia, unspecified: Secondary | ICD-10-CM | POA: Diagnosis not present

## 2015-05-27 DIAGNOSIS — N186 End stage renal disease: Secondary | ICD-10-CM | POA: Diagnosis not present

## 2015-05-27 DIAGNOSIS — D631 Anemia in chronic kidney disease: Secondary | ICD-10-CM | POA: Diagnosis not present

## 2015-05-28 DIAGNOSIS — D631 Anemia in chronic kidney disease: Secondary | ICD-10-CM | POA: Diagnosis not present

## 2015-05-28 DIAGNOSIS — N2581 Secondary hyperparathyroidism of renal origin: Secondary | ICD-10-CM | POA: Diagnosis not present

## 2015-05-28 DIAGNOSIS — Z79899 Other long term (current) drug therapy: Secondary | ICD-10-CM | POA: Diagnosis not present

## 2015-05-28 DIAGNOSIS — D509 Iron deficiency anemia, unspecified: Secondary | ICD-10-CM | POA: Diagnosis not present

## 2015-05-28 DIAGNOSIS — N186 End stage renal disease: Secondary | ICD-10-CM | POA: Diagnosis not present

## 2015-05-28 DIAGNOSIS — Z4932 Encounter for adequacy testing for peritoneal dialysis: Secondary | ICD-10-CM | POA: Diagnosis not present

## 2015-05-29 DIAGNOSIS — N2581 Secondary hyperparathyroidism of renal origin: Secondary | ICD-10-CM | POA: Diagnosis not present

## 2015-05-29 DIAGNOSIS — D509 Iron deficiency anemia, unspecified: Secondary | ICD-10-CM | POA: Diagnosis not present

## 2015-05-29 DIAGNOSIS — N186 End stage renal disease: Secondary | ICD-10-CM | POA: Diagnosis not present

## 2015-05-29 DIAGNOSIS — D631 Anemia in chronic kidney disease: Secondary | ICD-10-CM | POA: Diagnosis not present

## 2015-05-29 DIAGNOSIS — Z4932 Encounter for adequacy testing for peritoneal dialysis: Secondary | ICD-10-CM | POA: Diagnosis not present

## 2015-05-29 DIAGNOSIS — Z79899 Other long term (current) drug therapy: Secondary | ICD-10-CM | POA: Diagnosis not present

## 2015-05-30 DIAGNOSIS — N186 End stage renal disease: Secondary | ICD-10-CM | POA: Diagnosis not present

## 2015-05-30 DIAGNOSIS — D631 Anemia in chronic kidney disease: Secondary | ICD-10-CM | POA: Diagnosis not present

## 2015-05-30 DIAGNOSIS — D509 Iron deficiency anemia, unspecified: Secondary | ICD-10-CM | POA: Diagnosis not present

## 2015-05-30 DIAGNOSIS — Z4932 Encounter for adequacy testing for peritoneal dialysis: Secondary | ICD-10-CM | POA: Diagnosis not present

## 2015-05-30 DIAGNOSIS — N2581 Secondary hyperparathyroidism of renal origin: Secondary | ICD-10-CM | POA: Diagnosis not present

## 2015-05-30 DIAGNOSIS — Z79899 Other long term (current) drug therapy: Secondary | ICD-10-CM | POA: Diagnosis not present

## 2015-05-31 DIAGNOSIS — D509 Iron deficiency anemia, unspecified: Secondary | ICD-10-CM | POA: Diagnosis not present

## 2015-05-31 DIAGNOSIS — D631 Anemia in chronic kidney disease: Secondary | ICD-10-CM | POA: Diagnosis not present

## 2015-05-31 DIAGNOSIS — N2581 Secondary hyperparathyroidism of renal origin: Secondary | ICD-10-CM | POA: Diagnosis not present

## 2015-05-31 DIAGNOSIS — Z79899 Other long term (current) drug therapy: Secondary | ICD-10-CM | POA: Diagnosis not present

## 2015-05-31 DIAGNOSIS — Z4932 Encounter for adequacy testing for peritoneal dialysis: Secondary | ICD-10-CM | POA: Diagnosis not present

## 2015-05-31 DIAGNOSIS — N186 End stage renal disease: Secondary | ICD-10-CM | POA: Diagnosis not present

## 2015-06-01 DIAGNOSIS — D509 Iron deficiency anemia, unspecified: Secondary | ICD-10-CM | POA: Diagnosis not present

## 2015-06-01 DIAGNOSIS — D631 Anemia in chronic kidney disease: Secondary | ICD-10-CM | POA: Diagnosis not present

## 2015-06-01 DIAGNOSIS — Z4932 Encounter for adequacy testing for peritoneal dialysis: Secondary | ICD-10-CM | POA: Diagnosis not present

## 2015-06-01 DIAGNOSIS — Z79899 Other long term (current) drug therapy: Secondary | ICD-10-CM | POA: Diagnosis not present

## 2015-06-01 DIAGNOSIS — N186 End stage renal disease: Secondary | ICD-10-CM | POA: Diagnosis not present

## 2015-06-01 DIAGNOSIS — N2581 Secondary hyperparathyroidism of renal origin: Secondary | ICD-10-CM | POA: Diagnosis not present

## 2015-06-02 DIAGNOSIS — Z4932 Encounter for adequacy testing for peritoneal dialysis: Secondary | ICD-10-CM | POA: Diagnosis not present

## 2015-06-02 DIAGNOSIS — D631 Anemia in chronic kidney disease: Secondary | ICD-10-CM | POA: Diagnosis not present

## 2015-06-02 DIAGNOSIS — D509 Iron deficiency anemia, unspecified: Secondary | ICD-10-CM | POA: Diagnosis not present

## 2015-06-02 DIAGNOSIS — N186 End stage renal disease: Secondary | ICD-10-CM | POA: Diagnosis not present

## 2015-06-02 DIAGNOSIS — Z79899 Other long term (current) drug therapy: Secondary | ICD-10-CM | POA: Diagnosis not present

## 2015-06-02 DIAGNOSIS — N2581 Secondary hyperparathyroidism of renal origin: Secondary | ICD-10-CM | POA: Diagnosis not present

## 2015-06-03 DIAGNOSIS — D631 Anemia in chronic kidney disease: Secondary | ICD-10-CM | POA: Diagnosis not present

## 2015-06-03 DIAGNOSIS — Z4932 Encounter for adequacy testing for peritoneal dialysis: Secondary | ICD-10-CM | POA: Diagnosis not present

## 2015-06-03 DIAGNOSIS — D509 Iron deficiency anemia, unspecified: Secondary | ICD-10-CM | POA: Diagnosis not present

## 2015-06-03 DIAGNOSIS — N186 End stage renal disease: Secondary | ICD-10-CM | POA: Diagnosis not present

## 2015-06-03 DIAGNOSIS — N2581 Secondary hyperparathyroidism of renal origin: Secondary | ICD-10-CM | POA: Diagnosis not present

## 2015-06-03 DIAGNOSIS — Z79899 Other long term (current) drug therapy: Secondary | ICD-10-CM | POA: Diagnosis not present

## 2015-06-04 DIAGNOSIS — Z4932 Encounter for adequacy testing for peritoneal dialysis: Secondary | ICD-10-CM | POA: Diagnosis not present

## 2015-06-04 DIAGNOSIS — N2581 Secondary hyperparathyroidism of renal origin: Secondary | ICD-10-CM | POA: Diagnosis not present

## 2015-06-04 DIAGNOSIS — N186 End stage renal disease: Secondary | ICD-10-CM | POA: Diagnosis not present

## 2015-06-04 DIAGNOSIS — D509 Iron deficiency anemia, unspecified: Secondary | ICD-10-CM | POA: Diagnosis not present

## 2015-06-04 DIAGNOSIS — D631 Anemia in chronic kidney disease: Secondary | ICD-10-CM | POA: Diagnosis not present

## 2015-06-04 DIAGNOSIS — Z79899 Other long term (current) drug therapy: Secondary | ICD-10-CM | POA: Diagnosis not present

## 2015-06-05 DIAGNOSIS — D631 Anemia in chronic kidney disease: Secondary | ICD-10-CM | POA: Diagnosis not present

## 2015-06-05 DIAGNOSIS — Z4932 Encounter for adequacy testing for peritoneal dialysis: Secondary | ICD-10-CM | POA: Diagnosis not present

## 2015-06-05 DIAGNOSIS — D509 Iron deficiency anemia, unspecified: Secondary | ICD-10-CM | POA: Diagnosis not present

## 2015-06-05 DIAGNOSIS — N2581 Secondary hyperparathyroidism of renal origin: Secondary | ICD-10-CM | POA: Diagnosis not present

## 2015-06-05 DIAGNOSIS — N186 End stage renal disease: Secondary | ICD-10-CM | POA: Diagnosis not present

## 2015-06-05 DIAGNOSIS — Z79899 Other long term (current) drug therapy: Secondary | ICD-10-CM | POA: Diagnosis not present

## 2015-06-06 DIAGNOSIS — Z4932 Encounter for adequacy testing for peritoneal dialysis: Secondary | ICD-10-CM | POA: Diagnosis not present

## 2015-06-06 DIAGNOSIS — Z79899 Other long term (current) drug therapy: Secondary | ICD-10-CM | POA: Diagnosis not present

## 2015-06-06 DIAGNOSIS — N2581 Secondary hyperparathyroidism of renal origin: Secondary | ICD-10-CM | POA: Diagnosis not present

## 2015-06-06 DIAGNOSIS — D631 Anemia in chronic kidney disease: Secondary | ICD-10-CM | POA: Diagnosis not present

## 2015-06-06 DIAGNOSIS — N186 End stage renal disease: Secondary | ICD-10-CM | POA: Diagnosis not present

## 2015-06-06 DIAGNOSIS — D509 Iron deficiency anemia, unspecified: Secondary | ICD-10-CM | POA: Diagnosis not present

## 2015-06-07 DIAGNOSIS — D631 Anemia in chronic kidney disease: Secondary | ICD-10-CM | POA: Diagnosis not present

## 2015-06-07 DIAGNOSIS — Z4932 Encounter for adequacy testing for peritoneal dialysis: Secondary | ICD-10-CM | POA: Diagnosis not present

## 2015-06-07 DIAGNOSIS — N2581 Secondary hyperparathyroidism of renal origin: Secondary | ICD-10-CM | POA: Diagnosis not present

## 2015-06-07 DIAGNOSIS — D509 Iron deficiency anemia, unspecified: Secondary | ICD-10-CM | POA: Diagnosis not present

## 2015-06-07 DIAGNOSIS — Z79899 Other long term (current) drug therapy: Secondary | ICD-10-CM | POA: Diagnosis not present

## 2015-06-07 DIAGNOSIS — N186 End stage renal disease: Secondary | ICD-10-CM | POA: Diagnosis not present

## 2015-06-08 DIAGNOSIS — D509 Iron deficiency anemia, unspecified: Secondary | ICD-10-CM | POA: Diagnosis not present

## 2015-06-08 DIAGNOSIS — D631 Anemia in chronic kidney disease: Secondary | ICD-10-CM | POA: Diagnosis not present

## 2015-06-08 DIAGNOSIS — Z79899 Other long term (current) drug therapy: Secondary | ICD-10-CM | POA: Diagnosis not present

## 2015-06-08 DIAGNOSIS — N186 End stage renal disease: Secondary | ICD-10-CM | POA: Diagnosis not present

## 2015-06-08 DIAGNOSIS — Z4932 Encounter for adequacy testing for peritoneal dialysis: Secondary | ICD-10-CM | POA: Diagnosis not present

## 2015-06-08 DIAGNOSIS — N2581 Secondary hyperparathyroidism of renal origin: Secondary | ICD-10-CM | POA: Diagnosis not present

## 2015-06-09 DIAGNOSIS — Z4932 Encounter for adequacy testing for peritoneal dialysis: Secondary | ICD-10-CM | POA: Diagnosis not present

## 2015-06-09 DIAGNOSIS — Z79899 Other long term (current) drug therapy: Secondary | ICD-10-CM | POA: Diagnosis not present

## 2015-06-09 DIAGNOSIS — D631 Anemia in chronic kidney disease: Secondary | ICD-10-CM | POA: Diagnosis not present

## 2015-06-09 DIAGNOSIS — D509 Iron deficiency anemia, unspecified: Secondary | ICD-10-CM | POA: Diagnosis not present

## 2015-06-09 DIAGNOSIS — N186 End stage renal disease: Secondary | ICD-10-CM | POA: Diagnosis not present

## 2015-06-09 DIAGNOSIS — N2581 Secondary hyperparathyroidism of renal origin: Secondary | ICD-10-CM | POA: Diagnosis not present

## 2015-06-10 DIAGNOSIS — Z4932 Encounter for adequacy testing for peritoneal dialysis: Secondary | ICD-10-CM | POA: Diagnosis not present

## 2015-06-10 DIAGNOSIS — D509 Iron deficiency anemia, unspecified: Secondary | ICD-10-CM | POA: Diagnosis not present

## 2015-06-10 DIAGNOSIS — N186 End stage renal disease: Secondary | ICD-10-CM | POA: Diagnosis not present

## 2015-06-10 DIAGNOSIS — Z79899 Other long term (current) drug therapy: Secondary | ICD-10-CM | POA: Diagnosis not present

## 2015-06-10 DIAGNOSIS — N2581 Secondary hyperparathyroidism of renal origin: Secondary | ICD-10-CM | POA: Diagnosis not present

## 2015-06-10 DIAGNOSIS — D631 Anemia in chronic kidney disease: Secondary | ICD-10-CM | POA: Diagnosis not present

## 2015-06-11 DIAGNOSIS — N186 End stage renal disease: Secondary | ICD-10-CM | POA: Diagnosis not present

## 2015-06-11 DIAGNOSIS — N2581 Secondary hyperparathyroidism of renal origin: Secondary | ICD-10-CM | POA: Diagnosis not present

## 2015-06-11 DIAGNOSIS — D509 Iron deficiency anemia, unspecified: Secondary | ICD-10-CM | POA: Diagnosis not present

## 2015-06-11 DIAGNOSIS — D631 Anemia in chronic kidney disease: Secondary | ICD-10-CM | POA: Diagnosis not present

## 2015-06-11 DIAGNOSIS — Z4932 Encounter for adequacy testing for peritoneal dialysis: Secondary | ICD-10-CM | POA: Diagnosis not present

## 2015-06-11 DIAGNOSIS — Z79899 Other long term (current) drug therapy: Secondary | ICD-10-CM | POA: Diagnosis not present

## 2015-06-12 DIAGNOSIS — N2581 Secondary hyperparathyroidism of renal origin: Secondary | ICD-10-CM | POA: Diagnosis not present

## 2015-06-12 DIAGNOSIS — Z79899 Other long term (current) drug therapy: Secondary | ICD-10-CM | POA: Diagnosis not present

## 2015-06-12 DIAGNOSIS — N186 End stage renal disease: Secondary | ICD-10-CM | POA: Diagnosis not present

## 2015-06-12 DIAGNOSIS — D631 Anemia in chronic kidney disease: Secondary | ICD-10-CM | POA: Diagnosis not present

## 2015-06-12 DIAGNOSIS — Z4932 Encounter for adequacy testing for peritoneal dialysis: Secondary | ICD-10-CM | POA: Diagnosis not present

## 2015-06-12 DIAGNOSIS — D509 Iron deficiency anemia, unspecified: Secondary | ICD-10-CM | POA: Diagnosis not present

## 2015-06-13 DIAGNOSIS — Z79899 Other long term (current) drug therapy: Secondary | ICD-10-CM | POA: Diagnosis not present

## 2015-06-13 DIAGNOSIS — N186 End stage renal disease: Secondary | ICD-10-CM | POA: Diagnosis not present

## 2015-06-13 DIAGNOSIS — D509 Iron deficiency anemia, unspecified: Secondary | ICD-10-CM | POA: Diagnosis not present

## 2015-06-13 DIAGNOSIS — D631 Anemia in chronic kidney disease: Secondary | ICD-10-CM | POA: Diagnosis not present

## 2015-06-13 DIAGNOSIS — N2581 Secondary hyperparathyroidism of renal origin: Secondary | ICD-10-CM | POA: Diagnosis not present

## 2015-06-13 DIAGNOSIS — Z4932 Encounter for adequacy testing for peritoneal dialysis: Secondary | ICD-10-CM | POA: Diagnosis not present

## 2015-06-14 DIAGNOSIS — E870 Hyperosmolality and hypernatremia: Secondary | ICD-10-CM | POA: Diagnosis not present

## 2015-06-14 DIAGNOSIS — Z992 Dependence on renal dialysis: Secondary | ICD-10-CM | POA: Diagnosis not present

## 2015-06-14 DIAGNOSIS — D631 Anemia in chronic kidney disease: Secondary | ICD-10-CM | POA: Diagnosis not present

## 2015-06-14 DIAGNOSIS — Z4932 Encounter for adequacy testing for peritoneal dialysis: Secondary | ICD-10-CM | POA: Diagnosis not present

## 2015-06-14 DIAGNOSIS — Z79899 Other long term (current) drug therapy: Secondary | ICD-10-CM | POA: Diagnosis not present

## 2015-06-14 DIAGNOSIS — N186 End stage renal disease: Secondary | ICD-10-CM | POA: Diagnosis not present

## 2015-06-14 DIAGNOSIS — N2581 Secondary hyperparathyroidism of renal origin: Secondary | ICD-10-CM | POA: Diagnosis not present

## 2015-06-14 DIAGNOSIS — D509 Iron deficiency anemia, unspecified: Secondary | ICD-10-CM | POA: Diagnosis not present

## 2015-06-15 DIAGNOSIS — D631 Anemia in chronic kidney disease: Secondary | ICD-10-CM | POA: Diagnosis not present

## 2015-06-15 DIAGNOSIS — N186 End stage renal disease: Secondary | ICD-10-CM | POA: Diagnosis not present

## 2015-06-15 DIAGNOSIS — N2581 Secondary hyperparathyroidism of renal origin: Secondary | ICD-10-CM | POA: Diagnosis not present

## 2015-06-15 DIAGNOSIS — D509 Iron deficiency anemia, unspecified: Secondary | ICD-10-CM | POA: Diagnosis not present

## 2015-06-15 DIAGNOSIS — Z4932 Encounter for adequacy testing for peritoneal dialysis: Secondary | ICD-10-CM | POA: Diagnosis not present

## 2015-06-15 DIAGNOSIS — R17 Unspecified jaundice: Secondary | ICD-10-CM | POA: Diagnosis not present

## 2015-06-15 DIAGNOSIS — E440 Moderate protein-calorie malnutrition: Secondary | ICD-10-CM | POA: Diagnosis not present

## 2015-06-15 DIAGNOSIS — Z79899 Other long term (current) drug therapy: Secondary | ICD-10-CM | POA: Diagnosis not present

## 2015-06-16 DIAGNOSIS — D509 Iron deficiency anemia, unspecified: Secondary | ICD-10-CM | POA: Diagnosis not present

## 2015-06-16 DIAGNOSIS — N2581 Secondary hyperparathyroidism of renal origin: Secondary | ICD-10-CM | POA: Diagnosis not present

## 2015-06-16 DIAGNOSIS — Z4932 Encounter for adequacy testing for peritoneal dialysis: Secondary | ICD-10-CM | POA: Diagnosis not present

## 2015-06-16 DIAGNOSIS — E440 Moderate protein-calorie malnutrition: Secondary | ICD-10-CM | POA: Diagnosis not present

## 2015-06-16 DIAGNOSIS — D631 Anemia in chronic kidney disease: Secondary | ICD-10-CM | POA: Diagnosis not present

## 2015-06-16 DIAGNOSIS — N186 End stage renal disease: Secondary | ICD-10-CM | POA: Diagnosis not present

## 2015-06-17 DIAGNOSIS — Z4932 Encounter for adequacy testing for peritoneal dialysis: Secondary | ICD-10-CM | POA: Diagnosis not present

## 2015-06-17 DIAGNOSIS — N2581 Secondary hyperparathyroidism of renal origin: Secondary | ICD-10-CM | POA: Diagnosis not present

## 2015-06-17 DIAGNOSIS — D509 Iron deficiency anemia, unspecified: Secondary | ICD-10-CM | POA: Diagnosis not present

## 2015-06-17 DIAGNOSIS — N186 End stage renal disease: Secondary | ICD-10-CM | POA: Diagnosis not present

## 2015-06-17 DIAGNOSIS — D631 Anemia in chronic kidney disease: Secondary | ICD-10-CM | POA: Diagnosis not present

## 2015-06-17 DIAGNOSIS — E440 Moderate protein-calorie malnutrition: Secondary | ICD-10-CM | POA: Diagnosis not present

## 2015-06-18 DIAGNOSIS — N2581 Secondary hyperparathyroidism of renal origin: Secondary | ICD-10-CM | POA: Diagnosis not present

## 2015-06-18 DIAGNOSIS — D631 Anemia in chronic kidney disease: Secondary | ICD-10-CM | POA: Diagnosis not present

## 2015-06-18 DIAGNOSIS — Z4932 Encounter for adequacy testing for peritoneal dialysis: Secondary | ICD-10-CM | POA: Diagnosis not present

## 2015-06-18 DIAGNOSIS — N186 End stage renal disease: Secondary | ICD-10-CM | POA: Diagnosis not present

## 2015-06-18 DIAGNOSIS — E440 Moderate protein-calorie malnutrition: Secondary | ICD-10-CM | POA: Diagnosis not present

## 2015-06-18 DIAGNOSIS — D509 Iron deficiency anemia, unspecified: Secondary | ICD-10-CM | POA: Diagnosis not present

## 2015-06-19 DIAGNOSIS — Z4932 Encounter for adequacy testing for peritoneal dialysis: Secondary | ICD-10-CM | POA: Diagnosis not present

## 2015-06-19 DIAGNOSIS — N186 End stage renal disease: Secondary | ICD-10-CM | POA: Diagnosis not present

## 2015-06-19 DIAGNOSIS — D509 Iron deficiency anemia, unspecified: Secondary | ICD-10-CM | POA: Diagnosis not present

## 2015-06-19 DIAGNOSIS — D631 Anemia in chronic kidney disease: Secondary | ICD-10-CM | POA: Diagnosis not present

## 2015-06-19 DIAGNOSIS — E440 Moderate protein-calorie malnutrition: Secondary | ICD-10-CM | POA: Diagnosis not present

## 2015-06-19 DIAGNOSIS — N2581 Secondary hyperparathyroidism of renal origin: Secondary | ICD-10-CM | POA: Diagnosis not present

## 2015-06-20 DIAGNOSIS — D631 Anemia in chronic kidney disease: Secondary | ICD-10-CM | POA: Diagnosis not present

## 2015-06-20 DIAGNOSIS — Z4932 Encounter for adequacy testing for peritoneal dialysis: Secondary | ICD-10-CM | POA: Diagnosis not present

## 2015-06-20 DIAGNOSIS — D509 Iron deficiency anemia, unspecified: Secondary | ICD-10-CM | POA: Diagnosis not present

## 2015-06-20 DIAGNOSIS — E440 Moderate protein-calorie malnutrition: Secondary | ICD-10-CM | POA: Diagnosis not present

## 2015-06-20 DIAGNOSIS — N186 End stage renal disease: Secondary | ICD-10-CM | POA: Diagnosis not present

## 2015-06-20 DIAGNOSIS — R8299 Other abnormal findings in urine: Secondary | ICD-10-CM | POA: Diagnosis not present

## 2015-06-20 DIAGNOSIS — N2581 Secondary hyperparathyroidism of renal origin: Secondary | ICD-10-CM | POA: Diagnosis not present

## 2015-06-21 DIAGNOSIS — Z4932 Encounter for adequacy testing for peritoneal dialysis: Secondary | ICD-10-CM | POA: Diagnosis not present

## 2015-06-21 DIAGNOSIS — N2581 Secondary hyperparathyroidism of renal origin: Secondary | ICD-10-CM | POA: Diagnosis not present

## 2015-06-21 DIAGNOSIS — N186 End stage renal disease: Secondary | ICD-10-CM | POA: Diagnosis not present

## 2015-06-21 DIAGNOSIS — D631 Anemia in chronic kidney disease: Secondary | ICD-10-CM | POA: Diagnosis not present

## 2015-06-21 DIAGNOSIS — D509 Iron deficiency anemia, unspecified: Secondary | ICD-10-CM | POA: Diagnosis not present

## 2015-06-21 DIAGNOSIS — E440 Moderate protein-calorie malnutrition: Secondary | ICD-10-CM | POA: Diagnosis not present

## 2015-06-22 DIAGNOSIS — N186 End stage renal disease: Secondary | ICD-10-CM | POA: Diagnosis not present

## 2015-06-22 DIAGNOSIS — Z4932 Encounter for adequacy testing for peritoneal dialysis: Secondary | ICD-10-CM | POA: Diagnosis not present

## 2015-06-22 DIAGNOSIS — E440 Moderate protein-calorie malnutrition: Secondary | ICD-10-CM | POA: Diagnosis not present

## 2015-06-22 DIAGNOSIS — D509 Iron deficiency anemia, unspecified: Secondary | ICD-10-CM | POA: Diagnosis not present

## 2015-06-22 DIAGNOSIS — D631 Anemia in chronic kidney disease: Secondary | ICD-10-CM | POA: Diagnosis not present

## 2015-06-22 DIAGNOSIS — N2581 Secondary hyperparathyroidism of renal origin: Secondary | ICD-10-CM | POA: Diagnosis not present

## 2015-06-23 DIAGNOSIS — D509 Iron deficiency anemia, unspecified: Secondary | ICD-10-CM | POA: Diagnosis not present

## 2015-06-23 DIAGNOSIS — D631 Anemia in chronic kidney disease: Secondary | ICD-10-CM | POA: Diagnosis not present

## 2015-06-23 DIAGNOSIS — E440 Moderate protein-calorie malnutrition: Secondary | ICD-10-CM | POA: Diagnosis not present

## 2015-06-23 DIAGNOSIS — N2581 Secondary hyperparathyroidism of renal origin: Secondary | ICD-10-CM | POA: Diagnosis not present

## 2015-06-23 DIAGNOSIS — N186 End stage renal disease: Secondary | ICD-10-CM | POA: Diagnosis not present

## 2015-06-23 DIAGNOSIS — Z4932 Encounter for adequacy testing for peritoneal dialysis: Secondary | ICD-10-CM | POA: Diagnosis not present

## 2015-06-24 DIAGNOSIS — E440 Moderate protein-calorie malnutrition: Secondary | ICD-10-CM | POA: Diagnosis not present

## 2015-06-24 DIAGNOSIS — Z4932 Encounter for adequacy testing for peritoneal dialysis: Secondary | ICD-10-CM | POA: Diagnosis not present

## 2015-06-24 DIAGNOSIS — N2581 Secondary hyperparathyroidism of renal origin: Secondary | ICD-10-CM | POA: Diagnosis not present

## 2015-06-24 DIAGNOSIS — D509 Iron deficiency anemia, unspecified: Secondary | ICD-10-CM | POA: Diagnosis not present

## 2015-06-24 DIAGNOSIS — D631 Anemia in chronic kidney disease: Secondary | ICD-10-CM | POA: Diagnosis not present

## 2015-06-24 DIAGNOSIS — N186 End stage renal disease: Secondary | ICD-10-CM | POA: Diagnosis not present

## 2015-06-25 DIAGNOSIS — D631 Anemia in chronic kidney disease: Secondary | ICD-10-CM | POA: Diagnosis not present

## 2015-06-25 DIAGNOSIS — D509 Iron deficiency anemia, unspecified: Secondary | ICD-10-CM | POA: Diagnosis not present

## 2015-06-25 DIAGNOSIS — N186 End stage renal disease: Secondary | ICD-10-CM | POA: Diagnosis not present

## 2015-06-25 DIAGNOSIS — E440 Moderate protein-calorie malnutrition: Secondary | ICD-10-CM | POA: Diagnosis not present

## 2015-06-25 DIAGNOSIS — N2581 Secondary hyperparathyroidism of renal origin: Secondary | ICD-10-CM | POA: Diagnosis not present

## 2015-06-25 DIAGNOSIS — Z4932 Encounter for adequacy testing for peritoneal dialysis: Secondary | ICD-10-CM | POA: Diagnosis not present

## 2015-06-26 DIAGNOSIS — D509 Iron deficiency anemia, unspecified: Secondary | ICD-10-CM | POA: Diagnosis not present

## 2015-06-26 DIAGNOSIS — E440 Moderate protein-calorie malnutrition: Secondary | ICD-10-CM | POA: Diagnosis not present

## 2015-06-26 DIAGNOSIS — N2581 Secondary hyperparathyroidism of renal origin: Secondary | ICD-10-CM | POA: Diagnosis not present

## 2015-06-26 DIAGNOSIS — N186 End stage renal disease: Secondary | ICD-10-CM | POA: Diagnosis not present

## 2015-06-26 DIAGNOSIS — Z4932 Encounter for adequacy testing for peritoneal dialysis: Secondary | ICD-10-CM | POA: Diagnosis not present

## 2015-06-26 DIAGNOSIS — D631 Anemia in chronic kidney disease: Secondary | ICD-10-CM | POA: Diagnosis not present

## 2015-06-27 DIAGNOSIS — N2581 Secondary hyperparathyroidism of renal origin: Secondary | ICD-10-CM | POA: Diagnosis not present

## 2015-06-27 DIAGNOSIS — D631 Anemia in chronic kidney disease: Secondary | ICD-10-CM | POA: Diagnosis not present

## 2015-06-27 DIAGNOSIS — N186 End stage renal disease: Secondary | ICD-10-CM | POA: Diagnosis not present

## 2015-06-27 DIAGNOSIS — Z4932 Encounter for adequacy testing for peritoneal dialysis: Secondary | ICD-10-CM | POA: Diagnosis not present

## 2015-06-27 DIAGNOSIS — E440 Moderate protein-calorie malnutrition: Secondary | ICD-10-CM | POA: Diagnosis not present

## 2015-06-27 DIAGNOSIS — D509 Iron deficiency anemia, unspecified: Secondary | ICD-10-CM | POA: Diagnosis not present

## 2015-06-28 DIAGNOSIS — N186 End stage renal disease: Secondary | ICD-10-CM | POA: Diagnosis not present

## 2015-06-28 DIAGNOSIS — N2581 Secondary hyperparathyroidism of renal origin: Secondary | ICD-10-CM | POA: Diagnosis not present

## 2015-06-28 DIAGNOSIS — D509 Iron deficiency anemia, unspecified: Secondary | ICD-10-CM | POA: Diagnosis not present

## 2015-06-28 DIAGNOSIS — Z4932 Encounter for adequacy testing for peritoneal dialysis: Secondary | ICD-10-CM | POA: Diagnosis not present

## 2015-06-28 DIAGNOSIS — E440 Moderate protein-calorie malnutrition: Secondary | ICD-10-CM | POA: Diagnosis not present

## 2015-06-28 DIAGNOSIS — D631 Anemia in chronic kidney disease: Secondary | ICD-10-CM | POA: Diagnosis not present

## 2015-06-29 DIAGNOSIS — N186 End stage renal disease: Secondary | ICD-10-CM | POA: Diagnosis not present

## 2015-06-29 DIAGNOSIS — D509 Iron deficiency anemia, unspecified: Secondary | ICD-10-CM | POA: Diagnosis not present

## 2015-06-29 DIAGNOSIS — N2581 Secondary hyperparathyroidism of renal origin: Secondary | ICD-10-CM | POA: Diagnosis not present

## 2015-06-29 DIAGNOSIS — D631 Anemia in chronic kidney disease: Secondary | ICD-10-CM | POA: Diagnosis not present

## 2015-06-29 DIAGNOSIS — E440 Moderate protein-calorie malnutrition: Secondary | ICD-10-CM | POA: Diagnosis not present

## 2015-06-29 DIAGNOSIS — Z4932 Encounter for adequacy testing for peritoneal dialysis: Secondary | ICD-10-CM | POA: Diagnosis not present

## 2015-06-30 DIAGNOSIS — D509 Iron deficiency anemia, unspecified: Secondary | ICD-10-CM | POA: Diagnosis not present

## 2015-06-30 DIAGNOSIS — Z4932 Encounter for adequacy testing for peritoneal dialysis: Secondary | ICD-10-CM | POA: Diagnosis not present

## 2015-06-30 DIAGNOSIS — E440 Moderate protein-calorie malnutrition: Secondary | ICD-10-CM | POA: Diagnosis not present

## 2015-06-30 DIAGNOSIS — D631 Anemia in chronic kidney disease: Secondary | ICD-10-CM | POA: Diagnosis not present

## 2015-06-30 DIAGNOSIS — N186 End stage renal disease: Secondary | ICD-10-CM | POA: Diagnosis not present

## 2015-06-30 DIAGNOSIS — N2581 Secondary hyperparathyroidism of renal origin: Secondary | ICD-10-CM | POA: Diagnosis not present

## 2015-07-01 DIAGNOSIS — N2581 Secondary hyperparathyroidism of renal origin: Secondary | ICD-10-CM | POA: Diagnosis not present

## 2015-07-01 DIAGNOSIS — D509 Iron deficiency anemia, unspecified: Secondary | ICD-10-CM | POA: Diagnosis not present

## 2015-07-01 DIAGNOSIS — Z4932 Encounter for adequacy testing for peritoneal dialysis: Secondary | ICD-10-CM | POA: Diagnosis not present

## 2015-07-01 DIAGNOSIS — E440 Moderate protein-calorie malnutrition: Secondary | ICD-10-CM | POA: Diagnosis not present

## 2015-07-01 DIAGNOSIS — D631 Anemia in chronic kidney disease: Secondary | ICD-10-CM | POA: Diagnosis not present

## 2015-07-01 DIAGNOSIS — N186 End stage renal disease: Secondary | ICD-10-CM | POA: Diagnosis not present

## 2015-07-02 DIAGNOSIS — N186 End stage renal disease: Secondary | ICD-10-CM | POA: Diagnosis not present

## 2015-07-02 DIAGNOSIS — N2581 Secondary hyperparathyroidism of renal origin: Secondary | ICD-10-CM | POA: Diagnosis not present

## 2015-07-02 DIAGNOSIS — D631 Anemia in chronic kidney disease: Secondary | ICD-10-CM | POA: Diagnosis not present

## 2015-07-02 DIAGNOSIS — E440 Moderate protein-calorie malnutrition: Secondary | ICD-10-CM | POA: Diagnosis not present

## 2015-07-02 DIAGNOSIS — Z4932 Encounter for adequacy testing for peritoneal dialysis: Secondary | ICD-10-CM | POA: Diagnosis not present

## 2015-07-02 DIAGNOSIS — D509 Iron deficiency anemia, unspecified: Secondary | ICD-10-CM | POA: Diagnosis not present

## 2015-07-03 DIAGNOSIS — E440 Moderate protein-calorie malnutrition: Secondary | ICD-10-CM | POA: Diagnosis not present

## 2015-07-03 DIAGNOSIS — D631 Anemia in chronic kidney disease: Secondary | ICD-10-CM | POA: Diagnosis not present

## 2015-07-03 DIAGNOSIS — N186 End stage renal disease: Secondary | ICD-10-CM | POA: Diagnosis not present

## 2015-07-03 DIAGNOSIS — D509 Iron deficiency anemia, unspecified: Secondary | ICD-10-CM | POA: Diagnosis not present

## 2015-07-03 DIAGNOSIS — Z4932 Encounter for adequacy testing for peritoneal dialysis: Secondary | ICD-10-CM | POA: Diagnosis not present

## 2015-07-03 DIAGNOSIS — N2581 Secondary hyperparathyroidism of renal origin: Secondary | ICD-10-CM | POA: Diagnosis not present

## 2015-07-04 DIAGNOSIS — Z4932 Encounter for adequacy testing for peritoneal dialysis: Secondary | ICD-10-CM | POA: Diagnosis not present

## 2015-07-04 DIAGNOSIS — N186 End stage renal disease: Secondary | ICD-10-CM | POA: Diagnosis not present

## 2015-07-04 DIAGNOSIS — N2581 Secondary hyperparathyroidism of renal origin: Secondary | ICD-10-CM | POA: Diagnosis not present

## 2015-07-04 DIAGNOSIS — E440 Moderate protein-calorie malnutrition: Secondary | ICD-10-CM | POA: Diagnosis not present

## 2015-07-04 DIAGNOSIS — D509 Iron deficiency anemia, unspecified: Secondary | ICD-10-CM | POA: Diagnosis not present

## 2015-07-04 DIAGNOSIS — D631 Anemia in chronic kidney disease: Secondary | ICD-10-CM | POA: Diagnosis not present

## 2015-07-05 DIAGNOSIS — N2581 Secondary hyperparathyroidism of renal origin: Secondary | ICD-10-CM | POA: Diagnosis not present

## 2015-07-05 DIAGNOSIS — N186 End stage renal disease: Secondary | ICD-10-CM | POA: Diagnosis not present

## 2015-07-05 DIAGNOSIS — E440 Moderate protein-calorie malnutrition: Secondary | ICD-10-CM | POA: Diagnosis not present

## 2015-07-05 DIAGNOSIS — Z4932 Encounter for adequacy testing for peritoneal dialysis: Secondary | ICD-10-CM | POA: Diagnosis not present

## 2015-07-05 DIAGNOSIS — D509 Iron deficiency anemia, unspecified: Secondary | ICD-10-CM | POA: Diagnosis not present

## 2015-07-05 DIAGNOSIS — D631 Anemia in chronic kidney disease: Secondary | ICD-10-CM | POA: Diagnosis not present

## 2015-07-06 DIAGNOSIS — E440 Moderate protein-calorie malnutrition: Secondary | ICD-10-CM | POA: Diagnosis not present

## 2015-07-06 DIAGNOSIS — D509 Iron deficiency anemia, unspecified: Secondary | ICD-10-CM | POA: Diagnosis not present

## 2015-07-06 DIAGNOSIS — D631 Anemia in chronic kidney disease: Secondary | ICD-10-CM | POA: Diagnosis not present

## 2015-07-06 DIAGNOSIS — Z4932 Encounter for adequacy testing for peritoneal dialysis: Secondary | ICD-10-CM | POA: Diagnosis not present

## 2015-07-06 DIAGNOSIS — N2581 Secondary hyperparathyroidism of renal origin: Secondary | ICD-10-CM | POA: Diagnosis not present

## 2015-07-06 DIAGNOSIS — N186 End stage renal disease: Secondary | ICD-10-CM | POA: Diagnosis not present

## 2015-07-07 DIAGNOSIS — Z4932 Encounter for adequacy testing for peritoneal dialysis: Secondary | ICD-10-CM | POA: Diagnosis not present

## 2015-07-07 DIAGNOSIS — D509 Iron deficiency anemia, unspecified: Secondary | ICD-10-CM | POA: Diagnosis not present

## 2015-07-07 DIAGNOSIS — D631 Anemia in chronic kidney disease: Secondary | ICD-10-CM | POA: Diagnosis not present

## 2015-07-07 DIAGNOSIS — N186 End stage renal disease: Secondary | ICD-10-CM | POA: Diagnosis not present

## 2015-07-07 DIAGNOSIS — N2581 Secondary hyperparathyroidism of renal origin: Secondary | ICD-10-CM | POA: Diagnosis not present

## 2015-07-07 DIAGNOSIS — E440 Moderate protein-calorie malnutrition: Secondary | ICD-10-CM | POA: Diagnosis not present

## 2015-07-08 DIAGNOSIS — N2581 Secondary hyperparathyroidism of renal origin: Secondary | ICD-10-CM | POA: Diagnosis not present

## 2015-07-08 DIAGNOSIS — D509 Iron deficiency anemia, unspecified: Secondary | ICD-10-CM | POA: Diagnosis not present

## 2015-07-08 DIAGNOSIS — E440 Moderate protein-calorie malnutrition: Secondary | ICD-10-CM | POA: Diagnosis not present

## 2015-07-08 DIAGNOSIS — N186 End stage renal disease: Secondary | ICD-10-CM | POA: Diagnosis not present

## 2015-07-08 DIAGNOSIS — D631 Anemia in chronic kidney disease: Secondary | ICD-10-CM | POA: Diagnosis not present

## 2015-07-08 DIAGNOSIS — Z4932 Encounter for adequacy testing for peritoneal dialysis: Secondary | ICD-10-CM | POA: Diagnosis not present

## 2015-07-09 DIAGNOSIS — N2581 Secondary hyperparathyroidism of renal origin: Secondary | ICD-10-CM | POA: Diagnosis not present

## 2015-07-09 DIAGNOSIS — E440 Moderate protein-calorie malnutrition: Secondary | ICD-10-CM | POA: Diagnosis not present

## 2015-07-09 DIAGNOSIS — Z4932 Encounter for adequacy testing for peritoneal dialysis: Secondary | ICD-10-CM | POA: Diagnosis not present

## 2015-07-09 DIAGNOSIS — N186 End stage renal disease: Secondary | ICD-10-CM | POA: Diagnosis not present

## 2015-07-09 DIAGNOSIS — D509 Iron deficiency anemia, unspecified: Secondary | ICD-10-CM | POA: Diagnosis not present

## 2015-07-09 DIAGNOSIS — D631 Anemia in chronic kidney disease: Secondary | ICD-10-CM | POA: Diagnosis not present

## 2015-07-10 DIAGNOSIS — N186 End stage renal disease: Secondary | ICD-10-CM | POA: Diagnosis not present

## 2015-07-10 DIAGNOSIS — D509 Iron deficiency anemia, unspecified: Secondary | ICD-10-CM | POA: Diagnosis not present

## 2015-07-10 DIAGNOSIS — Z4932 Encounter for adequacy testing for peritoneal dialysis: Secondary | ICD-10-CM | POA: Diagnosis not present

## 2015-07-10 DIAGNOSIS — E440 Moderate protein-calorie malnutrition: Secondary | ICD-10-CM | POA: Diagnosis not present

## 2015-07-10 DIAGNOSIS — D631 Anemia in chronic kidney disease: Secondary | ICD-10-CM | POA: Diagnosis not present

## 2015-07-10 DIAGNOSIS — N2581 Secondary hyperparathyroidism of renal origin: Secondary | ICD-10-CM | POA: Diagnosis not present

## 2015-07-11 DIAGNOSIS — Z4932 Encounter for adequacy testing for peritoneal dialysis: Secondary | ICD-10-CM | POA: Diagnosis not present

## 2015-07-11 DIAGNOSIS — E440 Moderate protein-calorie malnutrition: Secondary | ICD-10-CM | POA: Diagnosis not present

## 2015-07-11 DIAGNOSIS — N2581 Secondary hyperparathyroidism of renal origin: Secondary | ICD-10-CM | POA: Diagnosis not present

## 2015-07-11 DIAGNOSIS — D509 Iron deficiency anemia, unspecified: Secondary | ICD-10-CM | POA: Diagnosis not present

## 2015-07-11 DIAGNOSIS — N186 End stage renal disease: Secondary | ICD-10-CM | POA: Diagnosis not present

## 2015-07-11 DIAGNOSIS — D631 Anemia in chronic kidney disease: Secondary | ICD-10-CM | POA: Diagnosis not present

## 2015-07-12 DIAGNOSIS — D631 Anemia in chronic kidney disease: Secondary | ICD-10-CM | POA: Diagnosis not present

## 2015-07-12 DIAGNOSIS — Z4932 Encounter for adequacy testing for peritoneal dialysis: Secondary | ICD-10-CM | POA: Diagnosis not present

## 2015-07-12 DIAGNOSIS — N2581 Secondary hyperparathyroidism of renal origin: Secondary | ICD-10-CM | POA: Diagnosis not present

## 2015-07-12 DIAGNOSIS — D509 Iron deficiency anemia, unspecified: Secondary | ICD-10-CM | POA: Diagnosis not present

## 2015-07-12 DIAGNOSIS — E440 Moderate protein-calorie malnutrition: Secondary | ICD-10-CM | POA: Diagnosis not present

## 2015-07-12 DIAGNOSIS — N186 End stage renal disease: Secondary | ICD-10-CM | POA: Diagnosis not present

## 2015-07-13 DIAGNOSIS — N2581 Secondary hyperparathyroidism of renal origin: Secondary | ICD-10-CM | POA: Diagnosis not present

## 2015-07-13 DIAGNOSIS — D631 Anemia in chronic kidney disease: Secondary | ICD-10-CM | POA: Diagnosis not present

## 2015-07-13 DIAGNOSIS — D509 Iron deficiency anemia, unspecified: Secondary | ICD-10-CM | POA: Diagnosis not present

## 2015-07-13 DIAGNOSIS — N186 End stage renal disease: Secondary | ICD-10-CM | POA: Diagnosis not present

## 2015-07-13 DIAGNOSIS — E440 Moderate protein-calorie malnutrition: Secondary | ICD-10-CM | POA: Diagnosis not present

## 2015-07-13 DIAGNOSIS — Z4932 Encounter for adequacy testing for peritoneal dialysis: Secondary | ICD-10-CM | POA: Diagnosis not present

## 2015-07-14 DIAGNOSIS — D631 Anemia in chronic kidney disease: Secondary | ICD-10-CM | POA: Diagnosis not present

## 2015-07-14 DIAGNOSIS — N186 End stage renal disease: Secondary | ICD-10-CM | POA: Diagnosis not present

## 2015-07-14 DIAGNOSIS — D509 Iron deficiency anemia, unspecified: Secondary | ICD-10-CM | POA: Diagnosis not present

## 2015-07-14 DIAGNOSIS — E440 Moderate protein-calorie malnutrition: Secondary | ICD-10-CM | POA: Diagnosis not present

## 2015-07-14 DIAGNOSIS — N2581 Secondary hyperparathyroidism of renal origin: Secondary | ICD-10-CM | POA: Diagnosis not present

## 2015-07-14 DIAGNOSIS — Z4932 Encounter for adequacy testing for peritoneal dialysis: Secondary | ICD-10-CM | POA: Diagnosis not present

## 2015-07-15 DIAGNOSIS — N186 End stage renal disease: Secondary | ICD-10-CM | POA: Diagnosis not present

## 2015-07-15 DIAGNOSIS — D631 Anemia in chronic kidney disease: Secondary | ICD-10-CM | POA: Diagnosis not present

## 2015-07-15 DIAGNOSIS — Z4932 Encounter for adequacy testing for peritoneal dialysis: Secondary | ICD-10-CM | POA: Diagnosis not present

## 2015-07-15 DIAGNOSIS — N2589 Other disorders resulting from impaired renal tubular function: Secondary | ICD-10-CM | POA: Diagnosis not present

## 2015-07-15 DIAGNOSIS — N2581 Secondary hyperparathyroidism of renal origin: Secondary | ICD-10-CM | POA: Diagnosis not present

## 2015-07-15 DIAGNOSIS — D509 Iron deficiency anemia, unspecified: Secondary | ICD-10-CM | POA: Diagnosis not present

## 2015-07-16 DIAGNOSIS — N2581 Secondary hyperparathyroidism of renal origin: Secondary | ICD-10-CM | POA: Diagnosis not present

## 2015-07-16 DIAGNOSIS — D509 Iron deficiency anemia, unspecified: Secondary | ICD-10-CM | POA: Diagnosis not present

## 2015-07-16 DIAGNOSIS — Z4932 Encounter for adequacy testing for peritoneal dialysis: Secondary | ICD-10-CM | POA: Diagnosis not present

## 2015-07-16 DIAGNOSIS — N2589 Other disorders resulting from impaired renal tubular function: Secondary | ICD-10-CM | POA: Diagnosis not present

## 2015-07-16 DIAGNOSIS — N186 End stage renal disease: Secondary | ICD-10-CM | POA: Diagnosis not present

## 2015-07-16 DIAGNOSIS — D631 Anemia in chronic kidney disease: Secondary | ICD-10-CM | POA: Diagnosis not present

## 2015-07-17 DIAGNOSIS — D509 Iron deficiency anemia, unspecified: Secondary | ICD-10-CM | POA: Diagnosis not present

## 2015-07-17 DIAGNOSIS — N186 End stage renal disease: Secondary | ICD-10-CM | POA: Diagnosis not present

## 2015-07-17 DIAGNOSIS — N2581 Secondary hyperparathyroidism of renal origin: Secondary | ICD-10-CM | POA: Diagnosis not present

## 2015-07-17 DIAGNOSIS — D631 Anemia in chronic kidney disease: Secondary | ICD-10-CM | POA: Diagnosis not present

## 2015-07-17 DIAGNOSIS — N2589 Other disorders resulting from impaired renal tubular function: Secondary | ICD-10-CM | POA: Diagnosis not present

## 2015-07-17 DIAGNOSIS — Z4932 Encounter for adequacy testing for peritoneal dialysis: Secondary | ICD-10-CM | POA: Diagnosis not present

## 2015-07-18 DIAGNOSIS — Z4932 Encounter for adequacy testing for peritoneal dialysis: Secondary | ICD-10-CM | POA: Diagnosis not present

## 2015-07-18 DIAGNOSIS — D509 Iron deficiency anemia, unspecified: Secondary | ICD-10-CM | POA: Diagnosis not present

## 2015-07-18 DIAGNOSIS — N2589 Other disorders resulting from impaired renal tubular function: Secondary | ICD-10-CM | POA: Diagnosis not present

## 2015-07-18 DIAGNOSIS — D631 Anemia in chronic kidney disease: Secondary | ICD-10-CM | POA: Diagnosis not present

## 2015-07-18 DIAGNOSIS — N2581 Secondary hyperparathyroidism of renal origin: Secondary | ICD-10-CM | POA: Diagnosis not present

## 2015-07-18 DIAGNOSIS — N186 End stage renal disease: Secondary | ICD-10-CM | POA: Diagnosis not present

## 2015-07-19 DIAGNOSIS — Z4932 Encounter for adequacy testing for peritoneal dialysis: Secondary | ICD-10-CM | POA: Diagnosis not present

## 2015-07-19 DIAGNOSIS — D509 Iron deficiency anemia, unspecified: Secondary | ICD-10-CM | POA: Diagnosis not present

## 2015-07-19 DIAGNOSIS — N2589 Other disorders resulting from impaired renal tubular function: Secondary | ICD-10-CM | POA: Diagnosis not present

## 2015-07-19 DIAGNOSIS — N186 End stage renal disease: Secondary | ICD-10-CM | POA: Diagnosis not present

## 2015-07-19 DIAGNOSIS — N2581 Secondary hyperparathyroidism of renal origin: Secondary | ICD-10-CM | POA: Diagnosis not present

## 2015-07-19 DIAGNOSIS — D631 Anemia in chronic kidney disease: Secondary | ICD-10-CM | POA: Diagnosis not present

## 2015-07-20 DIAGNOSIS — N186 End stage renal disease: Secondary | ICD-10-CM | POA: Diagnosis not present

## 2015-07-20 DIAGNOSIS — D509 Iron deficiency anemia, unspecified: Secondary | ICD-10-CM | POA: Diagnosis not present

## 2015-07-20 DIAGNOSIS — N2589 Other disorders resulting from impaired renal tubular function: Secondary | ICD-10-CM | POA: Diagnosis not present

## 2015-07-20 DIAGNOSIS — Z4932 Encounter for adequacy testing for peritoneal dialysis: Secondary | ICD-10-CM | POA: Diagnosis not present

## 2015-07-20 DIAGNOSIS — D631 Anemia in chronic kidney disease: Secondary | ICD-10-CM | POA: Diagnosis not present

## 2015-07-20 DIAGNOSIS — N2581 Secondary hyperparathyroidism of renal origin: Secondary | ICD-10-CM | POA: Diagnosis not present

## 2015-07-21 DIAGNOSIS — N2589 Other disorders resulting from impaired renal tubular function: Secondary | ICD-10-CM | POA: Diagnosis not present

## 2015-07-21 DIAGNOSIS — N186 End stage renal disease: Secondary | ICD-10-CM | POA: Diagnosis not present

## 2015-07-21 DIAGNOSIS — D509 Iron deficiency anemia, unspecified: Secondary | ICD-10-CM | POA: Diagnosis not present

## 2015-07-21 DIAGNOSIS — N2581 Secondary hyperparathyroidism of renal origin: Secondary | ICD-10-CM | POA: Diagnosis not present

## 2015-07-21 DIAGNOSIS — Z4932 Encounter for adequacy testing for peritoneal dialysis: Secondary | ICD-10-CM | POA: Diagnosis not present

## 2015-07-21 DIAGNOSIS — D631 Anemia in chronic kidney disease: Secondary | ICD-10-CM | POA: Diagnosis not present

## 2015-07-22 DIAGNOSIS — N2589 Other disorders resulting from impaired renal tubular function: Secondary | ICD-10-CM | POA: Diagnosis not present

## 2015-07-22 DIAGNOSIS — D631 Anemia in chronic kidney disease: Secondary | ICD-10-CM | POA: Diagnosis not present

## 2015-07-22 DIAGNOSIS — Z4932 Encounter for adequacy testing for peritoneal dialysis: Secondary | ICD-10-CM | POA: Diagnosis not present

## 2015-07-22 DIAGNOSIS — D509 Iron deficiency anemia, unspecified: Secondary | ICD-10-CM | POA: Diagnosis not present

## 2015-07-22 DIAGNOSIS — N186 End stage renal disease: Secondary | ICD-10-CM | POA: Diagnosis not present

## 2015-07-22 DIAGNOSIS — N2581 Secondary hyperparathyroidism of renal origin: Secondary | ICD-10-CM | POA: Diagnosis not present

## 2015-07-23 DIAGNOSIS — Z4932 Encounter for adequacy testing for peritoneal dialysis: Secondary | ICD-10-CM | POA: Diagnosis not present

## 2015-07-23 DIAGNOSIS — D631 Anemia in chronic kidney disease: Secondary | ICD-10-CM | POA: Diagnosis not present

## 2015-07-23 DIAGNOSIS — N2589 Other disorders resulting from impaired renal tubular function: Secondary | ICD-10-CM | POA: Diagnosis not present

## 2015-07-23 DIAGNOSIS — N186 End stage renal disease: Secondary | ICD-10-CM | POA: Diagnosis not present

## 2015-07-23 DIAGNOSIS — N2581 Secondary hyperparathyroidism of renal origin: Secondary | ICD-10-CM | POA: Diagnosis not present

## 2015-07-23 DIAGNOSIS — D509 Iron deficiency anemia, unspecified: Secondary | ICD-10-CM | POA: Diagnosis not present

## 2015-07-24 DIAGNOSIS — Z4932 Encounter for adequacy testing for peritoneal dialysis: Secondary | ICD-10-CM | POA: Diagnosis not present

## 2015-07-24 DIAGNOSIS — N186 End stage renal disease: Secondary | ICD-10-CM | POA: Diagnosis not present

## 2015-07-24 DIAGNOSIS — N2581 Secondary hyperparathyroidism of renal origin: Secondary | ICD-10-CM | POA: Diagnosis not present

## 2015-07-24 DIAGNOSIS — N2589 Other disorders resulting from impaired renal tubular function: Secondary | ICD-10-CM | POA: Diagnosis not present

## 2015-07-24 DIAGNOSIS — D509 Iron deficiency anemia, unspecified: Secondary | ICD-10-CM | POA: Diagnosis not present

## 2015-07-24 DIAGNOSIS — D631 Anemia in chronic kidney disease: Secondary | ICD-10-CM | POA: Diagnosis not present

## 2015-07-25 DIAGNOSIS — E784 Other hyperlipidemia: Secondary | ICD-10-CM | POA: Diagnosis not present

## 2015-07-25 DIAGNOSIS — N2581 Secondary hyperparathyroidism of renal origin: Secondary | ICD-10-CM | POA: Diagnosis not present

## 2015-07-25 DIAGNOSIS — D631 Anemia in chronic kidney disease: Secondary | ICD-10-CM | POA: Diagnosis not present

## 2015-07-25 DIAGNOSIS — N186 End stage renal disease: Secondary | ICD-10-CM | POA: Diagnosis not present

## 2015-07-25 DIAGNOSIS — R8299 Other abnormal findings in urine: Secondary | ICD-10-CM | POA: Diagnosis not present

## 2015-07-25 DIAGNOSIS — N2589 Other disorders resulting from impaired renal tubular function: Secondary | ICD-10-CM | POA: Diagnosis not present

## 2015-07-25 DIAGNOSIS — Z4932 Encounter for adequacy testing for peritoneal dialysis: Secondary | ICD-10-CM | POA: Diagnosis not present

## 2015-07-25 DIAGNOSIS — D509 Iron deficiency anemia, unspecified: Secondary | ICD-10-CM | POA: Diagnosis not present

## 2015-07-26 DIAGNOSIS — N186 End stage renal disease: Secondary | ICD-10-CM | POA: Diagnosis not present

## 2015-07-26 DIAGNOSIS — D631 Anemia in chronic kidney disease: Secondary | ICD-10-CM | POA: Diagnosis not present

## 2015-07-26 DIAGNOSIS — N2581 Secondary hyperparathyroidism of renal origin: Secondary | ICD-10-CM | POA: Diagnosis not present

## 2015-07-26 DIAGNOSIS — H109 Unspecified conjunctivitis: Secondary | ICD-10-CM | POA: Diagnosis not present

## 2015-07-26 DIAGNOSIS — Z4932 Encounter for adequacy testing for peritoneal dialysis: Secondary | ICD-10-CM | POA: Diagnosis not present

## 2015-07-26 DIAGNOSIS — N2589 Other disorders resulting from impaired renal tubular function: Secondary | ICD-10-CM | POA: Diagnosis not present

## 2015-07-26 DIAGNOSIS — D509 Iron deficiency anemia, unspecified: Secondary | ICD-10-CM | POA: Diagnosis not present

## 2015-07-27 DIAGNOSIS — N2581 Secondary hyperparathyroidism of renal origin: Secondary | ICD-10-CM | POA: Diagnosis not present

## 2015-07-27 DIAGNOSIS — D509 Iron deficiency anemia, unspecified: Secondary | ICD-10-CM | POA: Diagnosis not present

## 2015-07-27 DIAGNOSIS — N186 End stage renal disease: Secondary | ICD-10-CM | POA: Diagnosis not present

## 2015-07-27 DIAGNOSIS — D631 Anemia in chronic kidney disease: Secondary | ICD-10-CM | POA: Diagnosis not present

## 2015-07-27 DIAGNOSIS — Z4932 Encounter for adequacy testing for peritoneal dialysis: Secondary | ICD-10-CM | POA: Diagnosis not present

## 2015-07-27 DIAGNOSIS — N2589 Other disorders resulting from impaired renal tubular function: Secondary | ICD-10-CM | POA: Diagnosis not present

## 2015-07-28 DIAGNOSIS — D509 Iron deficiency anemia, unspecified: Secondary | ICD-10-CM | POA: Diagnosis not present

## 2015-07-28 DIAGNOSIS — N2581 Secondary hyperparathyroidism of renal origin: Secondary | ICD-10-CM | POA: Diagnosis not present

## 2015-07-28 DIAGNOSIS — N186 End stage renal disease: Secondary | ICD-10-CM | POA: Diagnosis not present

## 2015-07-28 DIAGNOSIS — N2589 Other disorders resulting from impaired renal tubular function: Secondary | ICD-10-CM | POA: Diagnosis not present

## 2015-07-28 DIAGNOSIS — D631 Anemia in chronic kidney disease: Secondary | ICD-10-CM | POA: Diagnosis not present

## 2015-07-28 DIAGNOSIS — Z4932 Encounter for adequacy testing for peritoneal dialysis: Secondary | ICD-10-CM | POA: Diagnosis not present

## 2015-07-29 DIAGNOSIS — D509 Iron deficiency anemia, unspecified: Secondary | ICD-10-CM | POA: Diagnosis not present

## 2015-07-29 DIAGNOSIS — N2589 Other disorders resulting from impaired renal tubular function: Secondary | ICD-10-CM | POA: Diagnosis not present

## 2015-07-29 DIAGNOSIS — D631 Anemia in chronic kidney disease: Secondary | ICD-10-CM | POA: Diagnosis not present

## 2015-07-29 DIAGNOSIS — N2581 Secondary hyperparathyroidism of renal origin: Secondary | ICD-10-CM | POA: Diagnosis not present

## 2015-07-29 DIAGNOSIS — Z4932 Encounter for adequacy testing for peritoneal dialysis: Secondary | ICD-10-CM | POA: Diagnosis not present

## 2015-07-29 DIAGNOSIS — N186 End stage renal disease: Secondary | ICD-10-CM | POA: Diagnosis not present

## 2015-07-30 DIAGNOSIS — Z4932 Encounter for adequacy testing for peritoneal dialysis: Secondary | ICD-10-CM | POA: Diagnosis not present

## 2015-07-30 DIAGNOSIS — N2581 Secondary hyperparathyroidism of renal origin: Secondary | ICD-10-CM | POA: Diagnosis not present

## 2015-07-30 DIAGNOSIS — D631 Anemia in chronic kidney disease: Secondary | ICD-10-CM | POA: Diagnosis not present

## 2015-07-30 DIAGNOSIS — D509 Iron deficiency anemia, unspecified: Secondary | ICD-10-CM | POA: Diagnosis not present

## 2015-07-30 DIAGNOSIS — N2589 Other disorders resulting from impaired renal tubular function: Secondary | ICD-10-CM | POA: Diagnosis not present

## 2015-07-30 DIAGNOSIS — N186 End stage renal disease: Secondary | ICD-10-CM | POA: Diagnosis not present

## 2015-07-31 DIAGNOSIS — N2589 Other disorders resulting from impaired renal tubular function: Secondary | ICD-10-CM | POA: Diagnosis not present

## 2015-07-31 DIAGNOSIS — D631 Anemia in chronic kidney disease: Secondary | ICD-10-CM | POA: Diagnosis not present

## 2015-07-31 DIAGNOSIS — Z4932 Encounter for adequacy testing for peritoneal dialysis: Secondary | ICD-10-CM | POA: Diagnosis not present

## 2015-07-31 DIAGNOSIS — N186 End stage renal disease: Secondary | ICD-10-CM | POA: Diagnosis not present

## 2015-07-31 DIAGNOSIS — N2581 Secondary hyperparathyroidism of renal origin: Secondary | ICD-10-CM | POA: Diagnosis not present

## 2015-07-31 DIAGNOSIS — D509 Iron deficiency anemia, unspecified: Secondary | ICD-10-CM | POA: Diagnosis not present

## 2015-08-01 DIAGNOSIS — D631 Anemia in chronic kidney disease: Secondary | ICD-10-CM | POA: Diagnosis not present

## 2015-08-01 DIAGNOSIS — D509 Iron deficiency anemia, unspecified: Secondary | ICD-10-CM | POA: Diagnosis not present

## 2015-08-01 DIAGNOSIS — N186 End stage renal disease: Secondary | ICD-10-CM | POA: Diagnosis not present

## 2015-08-01 DIAGNOSIS — N2589 Other disorders resulting from impaired renal tubular function: Secondary | ICD-10-CM | POA: Diagnosis not present

## 2015-08-01 DIAGNOSIS — N2581 Secondary hyperparathyroidism of renal origin: Secondary | ICD-10-CM | POA: Diagnosis not present

## 2015-08-01 DIAGNOSIS — Z4932 Encounter for adequacy testing for peritoneal dialysis: Secondary | ICD-10-CM | POA: Diagnosis not present

## 2015-08-02 DIAGNOSIS — N2589 Other disorders resulting from impaired renal tubular function: Secondary | ICD-10-CM | POA: Diagnosis not present

## 2015-08-02 DIAGNOSIS — N186 End stage renal disease: Secondary | ICD-10-CM | POA: Diagnosis not present

## 2015-08-02 DIAGNOSIS — N2581 Secondary hyperparathyroidism of renal origin: Secondary | ICD-10-CM | POA: Diagnosis not present

## 2015-08-02 DIAGNOSIS — Z4932 Encounter for adequacy testing for peritoneal dialysis: Secondary | ICD-10-CM | POA: Diagnosis not present

## 2015-08-02 DIAGNOSIS — D631 Anemia in chronic kidney disease: Secondary | ICD-10-CM | POA: Diagnosis not present

## 2015-08-02 DIAGNOSIS — D509 Iron deficiency anemia, unspecified: Secondary | ICD-10-CM | POA: Diagnosis not present

## 2015-08-03 DIAGNOSIS — D509 Iron deficiency anemia, unspecified: Secondary | ICD-10-CM | POA: Diagnosis not present

## 2015-08-03 DIAGNOSIS — N2581 Secondary hyperparathyroidism of renal origin: Secondary | ICD-10-CM | POA: Diagnosis not present

## 2015-08-03 DIAGNOSIS — N186 End stage renal disease: Secondary | ICD-10-CM | POA: Diagnosis not present

## 2015-08-03 DIAGNOSIS — Z4932 Encounter for adequacy testing for peritoneal dialysis: Secondary | ICD-10-CM | POA: Diagnosis not present

## 2015-08-03 DIAGNOSIS — N2589 Other disorders resulting from impaired renal tubular function: Secondary | ICD-10-CM | POA: Diagnosis not present

## 2015-08-03 DIAGNOSIS — D631 Anemia in chronic kidney disease: Secondary | ICD-10-CM | POA: Diagnosis not present

## 2015-08-04 DIAGNOSIS — D509 Iron deficiency anemia, unspecified: Secondary | ICD-10-CM | POA: Diagnosis not present

## 2015-08-04 DIAGNOSIS — N2589 Other disorders resulting from impaired renal tubular function: Secondary | ICD-10-CM | POA: Diagnosis not present

## 2015-08-04 DIAGNOSIS — Z4932 Encounter for adequacy testing for peritoneal dialysis: Secondary | ICD-10-CM | POA: Diagnosis not present

## 2015-08-04 DIAGNOSIS — D631 Anemia in chronic kidney disease: Secondary | ICD-10-CM | POA: Diagnosis not present

## 2015-08-04 DIAGNOSIS — N2581 Secondary hyperparathyroidism of renal origin: Secondary | ICD-10-CM | POA: Diagnosis not present

## 2015-08-04 DIAGNOSIS — N186 End stage renal disease: Secondary | ICD-10-CM | POA: Diagnosis not present

## 2015-08-05 DIAGNOSIS — D631 Anemia in chronic kidney disease: Secondary | ICD-10-CM | POA: Diagnosis not present

## 2015-08-05 DIAGNOSIS — Z4932 Encounter for adequacy testing for peritoneal dialysis: Secondary | ICD-10-CM | POA: Diagnosis not present

## 2015-08-05 DIAGNOSIS — N186 End stage renal disease: Secondary | ICD-10-CM | POA: Diagnosis not present

## 2015-08-05 DIAGNOSIS — N2589 Other disorders resulting from impaired renal tubular function: Secondary | ICD-10-CM | POA: Diagnosis not present

## 2015-08-05 DIAGNOSIS — D509 Iron deficiency anemia, unspecified: Secondary | ICD-10-CM | POA: Diagnosis not present

## 2015-08-05 DIAGNOSIS — N2581 Secondary hyperparathyroidism of renal origin: Secondary | ICD-10-CM | POA: Diagnosis not present

## 2015-08-06 DIAGNOSIS — Z4932 Encounter for adequacy testing for peritoneal dialysis: Secondary | ICD-10-CM | POA: Diagnosis not present

## 2015-08-06 DIAGNOSIS — D509 Iron deficiency anemia, unspecified: Secondary | ICD-10-CM | POA: Diagnosis not present

## 2015-08-06 DIAGNOSIS — N186 End stage renal disease: Secondary | ICD-10-CM | POA: Diagnosis not present

## 2015-08-06 DIAGNOSIS — N2589 Other disorders resulting from impaired renal tubular function: Secondary | ICD-10-CM | POA: Diagnosis not present

## 2015-08-06 DIAGNOSIS — D631 Anemia in chronic kidney disease: Secondary | ICD-10-CM | POA: Diagnosis not present

## 2015-08-06 DIAGNOSIS — N2581 Secondary hyperparathyroidism of renal origin: Secondary | ICD-10-CM | POA: Diagnosis not present

## 2015-08-07 DIAGNOSIS — N2589 Other disorders resulting from impaired renal tubular function: Secondary | ICD-10-CM | POA: Diagnosis not present

## 2015-08-07 DIAGNOSIS — N186 End stage renal disease: Secondary | ICD-10-CM | POA: Diagnosis not present

## 2015-08-07 DIAGNOSIS — Z4932 Encounter for adequacy testing for peritoneal dialysis: Secondary | ICD-10-CM | POA: Diagnosis not present

## 2015-08-07 DIAGNOSIS — D631 Anemia in chronic kidney disease: Secondary | ICD-10-CM | POA: Diagnosis not present

## 2015-08-07 DIAGNOSIS — N2581 Secondary hyperparathyroidism of renal origin: Secondary | ICD-10-CM | POA: Diagnosis not present

## 2015-08-07 DIAGNOSIS — D509 Iron deficiency anemia, unspecified: Secondary | ICD-10-CM | POA: Diagnosis not present

## 2015-08-08 DIAGNOSIS — D631 Anemia in chronic kidney disease: Secondary | ICD-10-CM | POA: Diagnosis not present

## 2015-08-08 DIAGNOSIS — N2589 Other disorders resulting from impaired renal tubular function: Secondary | ICD-10-CM | POA: Diagnosis not present

## 2015-08-08 DIAGNOSIS — D509 Iron deficiency anemia, unspecified: Secondary | ICD-10-CM | POA: Diagnosis not present

## 2015-08-08 DIAGNOSIS — N186 End stage renal disease: Secondary | ICD-10-CM | POA: Diagnosis not present

## 2015-08-08 DIAGNOSIS — Z4932 Encounter for adequacy testing for peritoneal dialysis: Secondary | ICD-10-CM | POA: Diagnosis not present

## 2015-08-08 DIAGNOSIS — N2581 Secondary hyperparathyroidism of renal origin: Secondary | ICD-10-CM | POA: Diagnosis not present

## 2015-08-09 DIAGNOSIS — R51 Headache: Secondary | ICD-10-CM | POA: Diagnosis not present

## 2015-08-09 DIAGNOSIS — S0093XA Contusion of unspecified part of head, initial encounter: Secondary | ICD-10-CM | POA: Diagnosis not present

## 2015-08-09 DIAGNOSIS — S098XXA Other specified injuries of head, initial encounter: Secondary | ICD-10-CM | POA: Diagnosis not present

## 2015-08-09 DIAGNOSIS — N186 End stage renal disease: Secondary | ICD-10-CM | POA: Diagnosis not present

## 2015-08-09 DIAGNOSIS — S199XXA Unspecified injury of neck, initial encounter: Secondary | ICD-10-CM | POA: Diagnosis not present

## 2015-08-09 DIAGNOSIS — D631 Anemia in chronic kidney disease: Secondary | ICD-10-CM | POA: Diagnosis not present

## 2015-08-09 DIAGNOSIS — M542 Cervicalgia: Secondary | ICD-10-CM | POA: Diagnosis not present

## 2015-08-09 DIAGNOSIS — Z4932 Encounter for adequacy testing for peritoneal dialysis: Secondary | ICD-10-CM | POA: Diagnosis not present

## 2015-08-09 DIAGNOSIS — S161XXA Strain of muscle, fascia and tendon at neck level, initial encounter: Secondary | ICD-10-CM | POA: Diagnosis not present

## 2015-08-09 DIAGNOSIS — N2589 Other disorders resulting from impaired renal tubular function: Secondary | ICD-10-CM | POA: Diagnosis not present

## 2015-08-09 DIAGNOSIS — S0003XA Contusion of scalp, initial encounter: Secondary | ICD-10-CM | POA: Diagnosis not present

## 2015-08-09 DIAGNOSIS — I10 Essential (primary) hypertension: Secondary | ICD-10-CM | POA: Diagnosis not present

## 2015-08-09 DIAGNOSIS — D509 Iron deficiency anemia, unspecified: Secondary | ICD-10-CM | POA: Diagnosis not present

## 2015-08-09 DIAGNOSIS — M436 Torticollis: Secondary | ICD-10-CM | POA: Diagnosis not present

## 2015-08-09 DIAGNOSIS — N2581 Secondary hyperparathyroidism of renal origin: Secondary | ICD-10-CM | POA: Diagnosis not present

## 2015-08-10 DIAGNOSIS — D631 Anemia in chronic kidney disease: Secondary | ICD-10-CM | POA: Diagnosis not present

## 2015-08-10 DIAGNOSIS — N2581 Secondary hyperparathyroidism of renal origin: Secondary | ICD-10-CM | POA: Diagnosis not present

## 2015-08-10 DIAGNOSIS — Z4932 Encounter for adequacy testing for peritoneal dialysis: Secondary | ICD-10-CM | POA: Diagnosis not present

## 2015-08-10 DIAGNOSIS — D509 Iron deficiency anemia, unspecified: Secondary | ICD-10-CM | POA: Diagnosis not present

## 2015-08-10 DIAGNOSIS — N2589 Other disorders resulting from impaired renal tubular function: Secondary | ICD-10-CM | POA: Diagnosis not present

## 2015-08-10 DIAGNOSIS — N186 End stage renal disease: Secondary | ICD-10-CM | POA: Diagnosis not present

## 2015-08-11 ENCOUNTER — Ambulatory Visit (HOSPITAL_COMMUNITY)
Admission: EM | Admit: 2015-08-11 | Discharge: 2015-08-11 | Disposition: A | Discharge status: Home / Self Care | Payer: Medicare Other

## 2015-08-11 DIAGNOSIS — N2581 Secondary hyperparathyroidism of renal origin: Secondary | ICD-10-CM | POA: Diagnosis not present

## 2015-08-11 DIAGNOSIS — D631 Anemia in chronic kidney disease: Secondary | ICD-10-CM | POA: Diagnosis not present

## 2015-08-11 DIAGNOSIS — D509 Iron deficiency anemia, unspecified: Secondary | ICD-10-CM | POA: Diagnosis not present

## 2015-08-11 DIAGNOSIS — N2589 Other disorders resulting from impaired renal tubular function: Secondary | ICD-10-CM | POA: Diagnosis not present

## 2015-08-11 DIAGNOSIS — Z4932 Encounter for adequacy testing for peritoneal dialysis: Secondary | ICD-10-CM | POA: Diagnosis not present

## 2015-08-11 DIAGNOSIS — N186 End stage renal disease: Secondary | ICD-10-CM | POA: Diagnosis not present

## 2015-08-12 DIAGNOSIS — Z4932 Encounter for adequacy testing for peritoneal dialysis: Secondary | ICD-10-CM | POA: Diagnosis not present

## 2015-08-12 DIAGNOSIS — N186 End stage renal disease: Secondary | ICD-10-CM | POA: Diagnosis not present

## 2015-08-12 DIAGNOSIS — N2589 Other disorders resulting from impaired renal tubular function: Secondary | ICD-10-CM | POA: Diagnosis not present

## 2015-08-12 DIAGNOSIS — D509 Iron deficiency anemia, unspecified: Secondary | ICD-10-CM | POA: Diagnosis not present

## 2015-08-12 DIAGNOSIS — N2581 Secondary hyperparathyroidism of renal origin: Secondary | ICD-10-CM | POA: Diagnosis not present

## 2015-08-12 DIAGNOSIS — D631 Anemia in chronic kidney disease: Secondary | ICD-10-CM | POA: Diagnosis not present

## 2015-08-13 DIAGNOSIS — N2581 Secondary hyperparathyroidism of renal origin: Secondary | ICD-10-CM | POA: Diagnosis not present

## 2015-08-13 DIAGNOSIS — N2589 Other disorders resulting from impaired renal tubular function: Secondary | ICD-10-CM | POA: Diagnosis not present

## 2015-08-13 DIAGNOSIS — D631 Anemia in chronic kidney disease: Secondary | ICD-10-CM | POA: Diagnosis not present

## 2015-08-13 DIAGNOSIS — Z4932 Encounter for adequacy testing for peritoneal dialysis: Secondary | ICD-10-CM | POA: Diagnosis not present

## 2015-08-13 DIAGNOSIS — N186 End stage renal disease: Secondary | ICD-10-CM | POA: Diagnosis not present

## 2015-08-13 DIAGNOSIS — D509 Iron deficiency anemia, unspecified: Secondary | ICD-10-CM | POA: Diagnosis not present

## 2015-08-14 DIAGNOSIS — N2581 Secondary hyperparathyroidism of renal origin: Secondary | ICD-10-CM | POA: Diagnosis not present

## 2015-08-14 DIAGNOSIS — N186 End stage renal disease: Secondary | ICD-10-CM | POA: Diagnosis not present

## 2015-08-14 DIAGNOSIS — Z4932 Encounter for adequacy testing for peritoneal dialysis: Secondary | ICD-10-CM | POA: Diagnosis not present

## 2015-08-14 DIAGNOSIS — N2589 Other disorders resulting from impaired renal tubular function: Secondary | ICD-10-CM | POA: Diagnosis not present

## 2015-08-14 DIAGNOSIS — D509 Iron deficiency anemia, unspecified: Secondary | ICD-10-CM | POA: Diagnosis not present

## 2015-08-14 DIAGNOSIS — D631 Anemia in chronic kidney disease: Secondary | ICD-10-CM | POA: Diagnosis not present

## 2015-08-15 DIAGNOSIS — Z79899 Other long term (current) drug therapy: Secondary | ICD-10-CM | POA: Diagnosis not present

## 2015-08-15 DIAGNOSIS — D631 Anemia in chronic kidney disease: Secondary | ICD-10-CM | POA: Diagnosis not present

## 2015-08-15 DIAGNOSIS — N186 End stage renal disease: Secondary | ICD-10-CM | POA: Diagnosis not present

## 2015-08-15 DIAGNOSIS — E440 Moderate protein-calorie malnutrition: Secondary | ICD-10-CM | POA: Diagnosis not present

## 2015-08-15 DIAGNOSIS — D509 Iron deficiency anemia, unspecified: Secondary | ICD-10-CM | POA: Diagnosis not present

## 2015-08-15 DIAGNOSIS — R17 Unspecified jaundice: Secondary | ICD-10-CM | POA: Diagnosis not present

## 2015-08-15 DIAGNOSIS — Z4932 Encounter for adequacy testing for peritoneal dialysis: Secondary | ICD-10-CM | POA: Diagnosis not present

## 2015-08-16 DIAGNOSIS — Z4932 Encounter for adequacy testing for peritoneal dialysis: Secondary | ICD-10-CM | POA: Diagnosis not present

## 2015-08-16 DIAGNOSIS — R8299 Other abnormal findings in urine: Secondary | ICD-10-CM | POA: Diagnosis not present

## 2015-08-16 DIAGNOSIS — Z79899 Other long term (current) drug therapy: Secondary | ICD-10-CM | POA: Diagnosis not present

## 2015-08-16 DIAGNOSIS — D509 Iron deficiency anemia, unspecified: Secondary | ICD-10-CM | POA: Diagnosis not present

## 2015-08-16 DIAGNOSIS — E440 Moderate protein-calorie malnutrition: Secondary | ICD-10-CM | POA: Diagnosis not present

## 2015-08-16 DIAGNOSIS — D631 Anemia in chronic kidney disease: Secondary | ICD-10-CM | POA: Diagnosis not present

## 2015-08-16 DIAGNOSIS — N186 End stage renal disease: Secondary | ICD-10-CM | POA: Diagnosis not present

## 2015-08-17 DIAGNOSIS — E440 Moderate protein-calorie malnutrition: Secondary | ICD-10-CM | POA: Diagnosis not present

## 2015-08-17 DIAGNOSIS — Z79899 Other long term (current) drug therapy: Secondary | ICD-10-CM | POA: Diagnosis not present

## 2015-08-17 DIAGNOSIS — N186 End stage renal disease: Secondary | ICD-10-CM | POA: Diagnosis not present

## 2015-08-17 DIAGNOSIS — Z4932 Encounter for adequacy testing for peritoneal dialysis: Secondary | ICD-10-CM | POA: Diagnosis not present

## 2015-08-17 DIAGNOSIS — D631 Anemia in chronic kidney disease: Secondary | ICD-10-CM | POA: Diagnosis not present

## 2015-08-17 DIAGNOSIS — D509 Iron deficiency anemia, unspecified: Secondary | ICD-10-CM | POA: Diagnosis not present

## 2015-08-18 DIAGNOSIS — Z4932 Encounter for adequacy testing for peritoneal dialysis: Secondary | ICD-10-CM | POA: Diagnosis not present

## 2015-08-18 DIAGNOSIS — D509 Iron deficiency anemia, unspecified: Secondary | ICD-10-CM | POA: Diagnosis not present

## 2015-08-18 DIAGNOSIS — N186 End stage renal disease: Secondary | ICD-10-CM | POA: Diagnosis not present

## 2015-08-18 DIAGNOSIS — Z79899 Other long term (current) drug therapy: Secondary | ICD-10-CM | POA: Diagnosis not present

## 2015-08-18 DIAGNOSIS — D631 Anemia in chronic kidney disease: Secondary | ICD-10-CM | POA: Diagnosis not present

## 2015-08-18 DIAGNOSIS — E440 Moderate protein-calorie malnutrition: Secondary | ICD-10-CM | POA: Diagnosis not present

## 2015-08-19 DIAGNOSIS — N186 End stage renal disease: Secondary | ICD-10-CM | POA: Diagnosis not present

## 2015-08-19 DIAGNOSIS — D509 Iron deficiency anemia, unspecified: Secondary | ICD-10-CM | POA: Diagnosis not present

## 2015-08-19 DIAGNOSIS — Z4932 Encounter for adequacy testing for peritoneal dialysis: Secondary | ICD-10-CM | POA: Diagnosis not present

## 2015-08-19 DIAGNOSIS — E440 Moderate protein-calorie malnutrition: Secondary | ICD-10-CM | POA: Diagnosis not present

## 2015-08-19 DIAGNOSIS — Z79899 Other long term (current) drug therapy: Secondary | ICD-10-CM | POA: Diagnosis not present

## 2015-08-19 DIAGNOSIS — D631 Anemia in chronic kidney disease: Secondary | ICD-10-CM | POA: Diagnosis not present

## 2015-08-20 DIAGNOSIS — E440 Moderate protein-calorie malnutrition: Secondary | ICD-10-CM | POA: Diagnosis not present

## 2015-08-20 DIAGNOSIS — D631 Anemia in chronic kidney disease: Secondary | ICD-10-CM | POA: Diagnosis not present

## 2015-08-20 DIAGNOSIS — N186 End stage renal disease: Secondary | ICD-10-CM | POA: Diagnosis not present

## 2015-08-20 DIAGNOSIS — D509 Iron deficiency anemia, unspecified: Secondary | ICD-10-CM | POA: Diagnosis not present

## 2015-08-20 DIAGNOSIS — Z4932 Encounter for adequacy testing for peritoneal dialysis: Secondary | ICD-10-CM | POA: Diagnosis not present

## 2015-08-20 DIAGNOSIS — Z79899 Other long term (current) drug therapy: Secondary | ICD-10-CM | POA: Diagnosis not present

## 2015-08-21 DIAGNOSIS — N186 End stage renal disease: Secondary | ICD-10-CM | POA: Diagnosis not present

## 2015-08-21 DIAGNOSIS — E440 Moderate protein-calorie malnutrition: Secondary | ICD-10-CM | POA: Diagnosis not present

## 2015-08-21 DIAGNOSIS — D509 Iron deficiency anemia, unspecified: Secondary | ICD-10-CM | POA: Diagnosis not present

## 2015-08-21 DIAGNOSIS — Z79899 Other long term (current) drug therapy: Secondary | ICD-10-CM | POA: Diagnosis not present

## 2015-08-21 DIAGNOSIS — Z4932 Encounter for adequacy testing for peritoneal dialysis: Secondary | ICD-10-CM | POA: Diagnosis not present

## 2015-08-21 DIAGNOSIS — D631 Anemia in chronic kidney disease: Secondary | ICD-10-CM | POA: Diagnosis not present

## 2015-08-22 DIAGNOSIS — Z4932 Encounter for adequacy testing for peritoneal dialysis: Secondary | ICD-10-CM | POA: Diagnosis not present

## 2015-08-22 DIAGNOSIS — N186 End stage renal disease: Secondary | ICD-10-CM | POA: Diagnosis not present

## 2015-08-22 DIAGNOSIS — Z79899 Other long term (current) drug therapy: Secondary | ICD-10-CM | POA: Diagnosis not present

## 2015-08-22 DIAGNOSIS — D631 Anemia in chronic kidney disease: Secondary | ICD-10-CM | POA: Diagnosis not present

## 2015-08-22 DIAGNOSIS — E440 Moderate protein-calorie malnutrition: Secondary | ICD-10-CM | POA: Diagnosis not present

## 2015-08-22 DIAGNOSIS — D509 Iron deficiency anemia, unspecified: Secondary | ICD-10-CM | POA: Diagnosis not present

## 2015-08-23 DIAGNOSIS — E440 Moderate protein-calorie malnutrition: Secondary | ICD-10-CM | POA: Diagnosis not present

## 2015-08-23 DIAGNOSIS — N186 End stage renal disease: Secondary | ICD-10-CM | POA: Diagnosis not present

## 2015-08-23 DIAGNOSIS — D631 Anemia in chronic kidney disease: Secondary | ICD-10-CM | POA: Diagnosis not present

## 2015-08-23 DIAGNOSIS — D509 Iron deficiency anemia, unspecified: Secondary | ICD-10-CM | POA: Diagnosis not present

## 2015-08-23 DIAGNOSIS — Z79899 Other long term (current) drug therapy: Secondary | ICD-10-CM | POA: Diagnosis not present

## 2015-08-23 DIAGNOSIS — Z4932 Encounter for adequacy testing for peritoneal dialysis: Secondary | ICD-10-CM | POA: Diagnosis not present

## 2015-08-24 DIAGNOSIS — Z79899 Other long term (current) drug therapy: Secondary | ICD-10-CM | POA: Diagnosis not present

## 2015-08-24 DIAGNOSIS — E440 Moderate protein-calorie malnutrition: Secondary | ICD-10-CM | POA: Diagnosis not present

## 2015-08-24 DIAGNOSIS — N186 End stage renal disease: Secondary | ICD-10-CM | POA: Diagnosis not present

## 2015-08-24 DIAGNOSIS — Z4932 Encounter for adequacy testing for peritoneal dialysis: Secondary | ICD-10-CM | POA: Diagnosis not present

## 2015-08-24 DIAGNOSIS — D509 Iron deficiency anemia, unspecified: Secondary | ICD-10-CM | POA: Diagnosis not present

## 2015-08-24 DIAGNOSIS — D631 Anemia in chronic kidney disease: Secondary | ICD-10-CM | POA: Diagnosis not present

## 2015-08-25 DIAGNOSIS — Z4932 Encounter for adequacy testing for peritoneal dialysis: Secondary | ICD-10-CM | POA: Diagnosis not present

## 2015-08-25 DIAGNOSIS — D631 Anemia in chronic kidney disease: Secondary | ICD-10-CM | POA: Diagnosis not present

## 2015-08-25 DIAGNOSIS — E440 Moderate protein-calorie malnutrition: Secondary | ICD-10-CM | POA: Diagnosis not present

## 2015-08-25 DIAGNOSIS — N186 End stage renal disease: Secondary | ICD-10-CM | POA: Diagnosis not present

## 2015-08-25 DIAGNOSIS — D509 Iron deficiency anemia, unspecified: Secondary | ICD-10-CM | POA: Diagnosis not present

## 2015-08-25 DIAGNOSIS — Z79899 Other long term (current) drug therapy: Secondary | ICD-10-CM | POA: Diagnosis not present

## 2015-08-26 DIAGNOSIS — D509 Iron deficiency anemia, unspecified: Secondary | ICD-10-CM | POA: Diagnosis not present

## 2015-08-26 DIAGNOSIS — E440 Moderate protein-calorie malnutrition: Secondary | ICD-10-CM | POA: Diagnosis not present

## 2015-08-26 DIAGNOSIS — Z4932 Encounter for adequacy testing for peritoneal dialysis: Secondary | ICD-10-CM | POA: Diagnosis not present

## 2015-08-26 DIAGNOSIS — Z79899 Other long term (current) drug therapy: Secondary | ICD-10-CM | POA: Diagnosis not present

## 2015-08-26 DIAGNOSIS — D631 Anemia in chronic kidney disease: Secondary | ICD-10-CM | POA: Diagnosis not present

## 2015-08-26 DIAGNOSIS — N186 End stage renal disease: Secondary | ICD-10-CM | POA: Diagnosis not present

## 2015-08-27 DIAGNOSIS — E440 Moderate protein-calorie malnutrition: Secondary | ICD-10-CM | POA: Diagnosis not present

## 2015-08-27 DIAGNOSIS — D631 Anemia in chronic kidney disease: Secondary | ICD-10-CM | POA: Diagnosis not present

## 2015-08-27 DIAGNOSIS — N186 End stage renal disease: Secondary | ICD-10-CM | POA: Diagnosis not present

## 2015-08-27 DIAGNOSIS — Z4932 Encounter for adequacy testing for peritoneal dialysis: Secondary | ICD-10-CM | POA: Diagnosis not present

## 2015-08-27 DIAGNOSIS — Z79899 Other long term (current) drug therapy: Secondary | ICD-10-CM | POA: Diagnosis not present

## 2015-08-27 DIAGNOSIS — D509 Iron deficiency anemia, unspecified: Secondary | ICD-10-CM | POA: Diagnosis not present

## 2015-08-28 DIAGNOSIS — D509 Iron deficiency anemia, unspecified: Secondary | ICD-10-CM | POA: Diagnosis not present

## 2015-08-28 DIAGNOSIS — E440 Moderate protein-calorie malnutrition: Secondary | ICD-10-CM | POA: Diagnosis not present

## 2015-08-28 DIAGNOSIS — N186 End stage renal disease: Secondary | ICD-10-CM | POA: Diagnosis not present

## 2015-08-28 DIAGNOSIS — D631 Anemia in chronic kidney disease: Secondary | ICD-10-CM | POA: Diagnosis not present

## 2015-08-28 DIAGNOSIS — Z4932 Encounter for adequacy testing for peritoneal dialysis: Secondary | ICD-10-CM | POA: Diagnosis not present

## 2015-08-28 DIAGNOSIS — Z79899 Other long term (current) drug therapy: Secondary | ICD-10-CM | POA: Diagnosis not present

## 2015-08-29 DIAGNOSIS — Z4932 Encounter for adequacy testing for peritoneal dialysis: Secondary | ICD-10-CM | POA: Diagnosis not present

## 2015-08-29 DIAGNOSIS — Z79899 Other long term (current) drug therapy: Secondary | ICD-10-CM | POA: Diagnosis not present

## 2015-08-29 DIAGNOSIS — E440 Moderate protein-calorie malnutrition: Secondary | ICD-10-CM | POA: Diagnosis not present

## 2015-08-29 DIAGNOSIS — D631 Anemia in chronic kidney disease: Secondary | ICD-10-CM | POA: Diagnosis not present

## 2015-08-29 DIAGNOSIS — N186 End stage renal disease: Secondary | ICD-10-CM | POA: Diagnosis not present

## 2015-08-29 DIAGNOSIS — D509 Iron deficiency anemia, unspecified: Secondary | ICD-10-CM | POA: Diagnosis not present

## 2015-08-30 DIAGNOSIS — Z79899 Other long term (current) drug therapy: Secondary | ICD-10-CM | POA: Diagnosis not present

## 2015-08-30 DIAGNOSIS — E440 Moderate protein-calorie malnutrition: Secondary | ICD-10-CM | POA: Diagnosis not present

## 2015-08-30 DIAGNOSIS — D631 Anemia in chronic kidney disease: Secondary | ICD-10-CM | POA: Diagnosis not present

## 2015-08-30 DIAGNOSIS — Z4932 Encounter for adequacy testing for peritoneal dialysis: Secondary | ICD-10-CM | POA: Diagnosis not present

## 2015-08-30 DIAGNOSIS — D509 Iron deficiency anemia, unspecified: Secondary | ICD-10-CM | POA: Diagnosis not present

## 2015-08-30 DIAGNOSIS — N186 End stage renal disease: Secondary | ICD-10-CM | POA: Diagnosis not present

## 2015-08-31 DIAGNOSIS — D631 Anemia in chronic kidney disease: Secondary | ICD-10-CM | POA: Diagnosis not present

## 2015-08-31 DIAGNOSIS — D509 Iron deficiency anemia, unspecified: Secondary | ICD-10-CM | POA: Diagnosis not present

## 2015-08-31 DIAGNOSIS — Z4932 Encounter for adequacy testing for peritoneal dialysis: Secondary | ICD-10-CM | POA: Diagnosis not present

## 2015-08-31 DIAGNOSIS — N186 End stage renal disease: Secondary | ICD-10-CM | POA: Diagnosis not present

## 2015-08-31 DIAGNOSIS — Z79899 Other long term (current) drug therapy: Secondary | ICD-10-CM | POA: Diagnosis not present

## 2015-08-31 DIAGNOSIS — E440 Moderate protein-calorie malnutrition: Secondary | ICD-10-CM | POA: Diagnosis not present

## 2015-09-01 DIAGNOSIS — N186 End stage renal disease: Secondary | ICD-10-CM | POA: Diagnosis not present

## 2015-09-01 DIAGNOSIS — D631 Anemia in chronic kidney disease: Secondary | ICD-10-CM | POA: Diagnosis not present

## 2015-09-01 DIAGNOSIS — Z4932 Encounter for adequacy testing for peritoneal dialysis: Secondary | ICD-10-CM | POA: Diagnosis not present

## 2015-09-01 DIAGNOSIS — Z79899 Other long term (current) drug therapy: Secondary | ICD-10-CM | POA: Diagnosis not present

## 2015-09-01 DIAGNOSIS — D509 Iron deficiency anemia, unspecified: Secondary | ICD-10-CM | POA: Diagnosis not present

## 2015-09-01 DIAGNOSIS — E440 Moderate protein-calorie malnutrition: Secondary | ICD-10-CM | POA: Diagnosis not present

## 2015-09-02 DIAGNOSIS — E440 Moderate protein-calorie malnutrition: Secondary | ICD-10-CM | POA: Diagnosis not present

## 2015-09-02 DIAGNOSIS — D509 Iron deficiency anemia, unspecified: Secondary | ICD-10-CM | POA: Diagnosis not present

## 2015-09-02 DIAGNOSIS — Z79899 Other long term (current) drug therapy: Secondary | ICD-10-CM | POA: Diagnosis not present

## 2015-09-02 DIAGNOSIS — Z4932 Encounter for adequacy testing for peritoneal dialysis: Secondary | ICD-10-CM | POA: Diagnosis not present

## 2015-09-02 DIAGNOSIS — N186 End stage renal disease: Secondary | ICD-10-CM | POA: Diagnosis not present

## 2015-09-02 DIAGNOSIS — D631 Anemia in chronic kidney disease: Secondary | ICD-10-CM | POA: Diagnosis not present

## 2015-09-03 DIAGNOSIS — D509 Iron deficiency anemia, unspecified: Secondary | ICD-10-CM | POA: Diagnosis not present

## 2015-09-03 DIAGNOSIS — Z79899 Other long term (current) drug therapy: Secondary | ICD-10-CM | POA: Diagnosis not present

## 2015-09-03 DIAGNOSIS — D631 Anemia in chronic kidney disease: Secondary | ICD-10-CM | POA: Diagnosis not present

## 2015-09-03 DIAGNOSIS — N186 End stage renal disease: Secondary | ICD-10-CM | POA: Diagnosis not present

## 2015-09-03 DIAGNOSIS — Z4932 Encounter for adequacy testing for peritoneal dialysis: Secondary | ICD-10-CM | POA: Diagnosis not present

## 2015-09-03 DIAGNOSIS — E440 Moderate protein-calorie malnutrition: Secondary | ICD-10-CM | POA: Diagnosis not present

## 2015-09-04 DIAGNOSIS — D631 Anemia in chronic kidney disease: Secondary | ICD-10-CM | POA: Diagnosis not present

## 2015-09-04 DIAGNOSIS — Z4932 Encounter for adequacy testing for peritoneal dialysis: Secondary | ICD-10-CM | POA: Diagnosis not present

## 2015-09-04 DIAGNOSIS — E440 Moderate protein-calorie malnutrition: Secondary | ICD-10-CM | POA: Diagnosis not present

## 2015-09-04 DIAGNOSIS — N186 End stage renal disease: Secondary | ICD-10-CM | POA: Diagnosis not present

## 2015-09-04 DIAGNOSIS — Z79899 Other long term (current) drug therapy: Secondary | ICD-10-CM | POA: Diagnosis not present

## 2015-09-04 DIAGNOSIS — D509 Iron deficiency anemia, unspecified: Secondary | ICD-10-CM | POA: Diagnosis not present

## 2015-09-05 DIAGNOSIS — D631 Anemia in chronic kidney disease: Secondary | ICD-10-CM | POA: Diagnosis not present

## 2015-09-05 DIAGNOSIS — D509 Iron deficiency anemia, unspecified: Secondary | ICD-10-CM | POA: Diagnosis not present

## 2015-09-05 DIAGNOSIS — E440 Moderate protein-calorie malnutrition: Secondary | ICD-10-CM | POA: Diagnosis not present

## 2015-09-05 DIAGNOSIS — Z4932 Encounter for adequacy testing for peritoneal dialysis: Secondary | ICD-10-CM | POA: Diagnosis not present

## 2015-09-05 DIAGNOSIS — N186 End stage renal disease: Secondary | ICD-10-CM | POA: Diagnosis not present

## 2015-09-05 DIAGNOSIS — Z79899 Other long term (current) drug therapy: Secondary | ICD-10-CM | POA: Diagnosis not present

## 2015-09-06 DIAGNOSIS — Z79899 Other long term (current) drug therapy: Secondary | ICD-10-CM | POA: Diagnosis not present

## 2015-09-06 DIAGNOSIS — D509 Iron deficiency anemia, unspecified: Secondary | ICD-10-CM | POA: Diagnosis not present

## 2015-09-06 DIAGNOSIS — N186 End stage renal disease: Secondary | ICD-10-CM | POA: Diagnosis not present

## 2015-09-06 DIAGNOSIS — E440 Moderate protein-calorie malnutrition: Secondary | ICD-10-CM | POA: Diagnosis not present

## 2015-09-06 DIAGNOSIS — Z4932 Encounter for adequacy testing for peritoneal dialysis: Secondary | ICD-10-CM | POA: Diagnosis not present

## 2015-09-06 DIAGNOSIS — D631 Anemia in chronic kidney disease: Secondary | ICD-10-CM | POA: Diagnosis not present

## 2015-09-07 DIAGNOSIS — N186 End stage renal disease: Secondary | ICD-10-CM | POA: Diagnosis not present

## 2015-09-07 DIAGNOSIS — Z4932 Encounter for adequacy testing for peritoneal dialysis: Secondary | ICD-10-CM | POA: Diagnosis not present

## 2015-09-07 DIAGNOSIS — Z79899 Other long term (current) drug therapy: Secondary | ICD-10-CM | POA: Diagnosis not present

## 2015-09-07 DIAGNOSIS — E440 Moderate protein-calorie malnutrition: Secondary | ICD-10-CM | POA: Diagnosis not present

## 2015-09-07 DIAGNOSIS — D631 Anemia in chronic kidney disease: Secondary | ICD-10-CM | POA: Diagnosis not present

## 2015-09-07 DIAGNOSIS — D509 Iron deficiency anemia, unspecified: Secondary | ICD-10-CM | POA: Diagnosis not present

## 2015-09-08 DIAGNOSIS — E440 Moderate protein-calorie malnutrition: Secondary | ICD-10-CM | POA: Diagnosis not present

## 2015-09-08 DIAGNOSIS — I120 Hypertensive chronic kidney disease with stage 5 chronic kidney disease or end stage renal disease: Secondary | ICD-10-CM | POA: Diagnosis not present

## 2015-09-08 DIAGNOSIS — Z79899 Other long term (current) drug therapy: Secondary | ICD-10-CM | POA: Diagnosis not present

## 2015-09-08 DIAGNOSIS — N2581 Secondary hyperparathyroidism of renal origin: Secondary | ICD-10-CM | POA: Diagnosis not present

## 2015-09-08 DIAGNOSIS — Z4932 Encounter for adequacy testing for peritoneal dialysis: Secondary | ICD-10-CM | POA: Diagnosis not present

## 2015-09-08 DIAGNOSIS — N186 End stage renal disease: Secondary | ICD-10-CM | POA: Diagnosis not present

## 2015-09-08 DIAGNOSIS — D631 Anemia in chronic kidney disease: Secondary | ICD-10-CM | POA: Diagnosis not present

## 2015-09-08 DIAGNOSIS — D509 Iron deficiency anemia, unspecified: Secondary | ICD-10-CM | POA: Diagnosis not present

## 2015-09-09 DIAGNOSIS — E440 Moderate protein-calorie malnutrition: Secondary | ICD-10-CM | POA: Diagnosis not present

## 2015-09-09 DIAGNOSIS — D631 Anemia in chronic kidney disease: Secondary | ICD-10-CM | POA: Diagnosis not present

## 2015-09-09 DIAGNOSIS — D509 Iron deficiency anemia, unspecified: Secondary | ICD-10-CM | POA: Diagnosis not present

## 2015-09-09 DIAGNOSIS — Z79899 Other long term (current) drug therapy: Secondary | ICD-10-CM | POA: Diagnosis not present

## 2015-09-09 DIAGNOSIS — Z4932 Encounter for adequacy testing for peritoneal dialysis: Secondary | ICD-10-CM | POA: Diagnosis not present

## 2015-09-09 DIAGNOSIS — N186 End stage renal disease: Secondary | ICD-10-CM | POA: Diagnosis not present

## 2015-09-10 DIAGNOSIS — D509 Iron deficiency anemia, unspecified: Secondary | ICD-10-CM | POA: Diagnosis not present

## 2015-09-10 DIAGNOSIS — N186 End stage renal disease: Secondary | ICD-10-CM | POA: Diagnosis not present

## 2015-09-10 DIAGNOSIS — E440 Moderate protein-calorie malnutrition: Secondary | ICD-10-CM | POA: Diagnosis not present

## 2015-09-10 DIAGNOSIS — Z4932 Encounter for adequacy testing for peritoneal dialysis: Secondary | ICD-10-CM | POA: Diagnosis not present

## 2015-09-10 DIAGNOSIS — D631 Anemia in chronic kidney disease: Secondary | ICD-10-CM | POA: Diagnosis not present

## 2015-09-10 DIAGNOSIS — Z79899 Other long term (current) drug therapy: Secondary | ICD-10-CM | POA: Diagnosis not present

## 2015-09-11 DIAGNOSIS — Z79899 Other long term (current) drug therapy: Secondary | ICD-10-CM | POA: Diagnosis not present

## 2015-09-11 DIAGNOSIS — D631 Anemia in chronic kidney disease: Secondary | ICD-10-CM | POA: Diagnosis not present

## 2015-09-11 DIAGNOSIS — Z4932 Encounter for adequacy testing for peritoneal dialysis: Secondary | ICD-10-CM | POA: Diagnosis not present

## 2015-09-11 DIAGNOSIS — E440 Moderate protein-calorie malnutrition: Secondary | ICD-10-CM | POA: Diagnosis not present

## 2015-09-11 DIAGNOSIS — D509 Iron deficiency anemia, unspecified: Secondary | ICD-10-CM | POA: Diagnosis not present

## 2015-09-11 DIAGNOSIS — N186 End stage renal disease: Secondary | ICD-10-CM | POA: Diagnosis not present

## 2015-09-12 DIAGNOSIS — D631 Anemia in chronic kidney disease: Secondary | ICD-10-CM | POA: Diagnosis not present

## 2015-09-12 DIAGNOSIS — N186 End stage renal disease: Secondary | ICD-10-CM | POA: Diagnosis not present

## 2015-09-12 DIAGNOSIS — Z79899 Other long term (current) drug therapy: Secondary | ICD-10-CM | POA: Diagnosis not present

## 2015-09-12 DIAGNOSIS — Z4932 Encounter for adequacy testing for peritoneal dialysis: Secondary | ICD-10-CM | POA: Diagnosis not present

## 2015-09-12 DIAGNOSIS — D509 Iron deficiency anemia, unspecified: Secondary | ICD-10-CM | POA: Diagnosis not present

## 2015-09-12 DIAGNOSIS — E440 Moderate protein-calorie malnutrition: Secondary | ICD-10-CM | POA: Diagnosis not present

## 2015-09-13 DIAGNOSIS — Z79899 Other long term (current) drug therapy: Secondary | ICD-10-CM | POA: Diagnosis not present

## 2015-09-13 DIAGNOSIS — E440 Moderate protein-calorie malnutrition: Secondary | ICD-10-CM | POA: Diagnosis not present

## 2015-09-13 DIAGNOSIS — D509 Iron deficiency anemia, unspecified: Secondary | ICD-10-CM | POA: Diagnosis not present

## 2015-09-13 DIAGNOSIS — Z4932 Encounter for adequacy testing for peritoneal dialysis: Secondary | ICD-10-CM | POA: Diagnosis not present

## 2015-09-13 DIAGNOSIS — D631 Anemia in chronic kidney disease: Secondary | ICD-10-CM | POA: Diagnosis not present

## 2015-09-13 DIAGNOSIS — N186 End stage renal disease: Secondary | ICD-10-CM | POA: Diagnosis not present

## 2015-09-14 DIAGNOSIS — D509 Iron deficiency anemia, unspecified: Secondary | ICD-10-CM | POA: Diagnosis not present

## 2015-09-14 DIAGNOSIS — D631 Anemia in chronic kidney disease: Secondary | ICD-10-CM | POA: Diagnosis not present

## 2015-09-14 DIAGNOSIS — N186 End stage renal disease: Secondary | ICD-10-CM | POA: Diagnosis not present

## 2015-09-14 DIAGNOSIS — E440 Moderate protein-calorie malnutrition: Secondary | ICD-10-CM | POA: Diagnosis not present

## 2015-09-14 DIAGNOSIS — Z4932 Encounter for adequacy testing for peritoneal dialysis: Secondary | ICD-10-CM | POA: Diagnosis not present

## 2015-09-14 DIAGNOSIS — Z79899 Other long term (current) drug therapy: Secondary | ICD-10-CM | POA: Diagnosis not present

## 2015-09-15 DIAGNOSIS — N2589 Other disorders resulting from impaired renal tubular function: Secondary | ICD-10-CM | POA: Diagnosis not present

## 2015-09-15 DIAGNOSIS — K769 Liver disease, unspecified: Secondary | ICD-10-CM | POA: Diagnosis not present

## 2015-09-15 DIAGNOSIS — D513 Other dietary vitamin B12 deficiency anemia: Secondary | ICD-10-CM | POA: Diagnosis not present

## 2015-09-15 DIAGNOSIS — N2581 Secondary hyperparathyroidism of renal origin: Secondary | ICD-10-CM | POA: Diagnosis not present

## 2015-09-15 DIAGNOSIS — R8299 Other abnormal findings in urine: Secondary | ICD-10-CM | POA: Diagnosis not present

## 2015-09-15 DIAGNOSIS — N186 End stage renal disease: Secondary | ICD-10-CM | POA: Diagnosis not present

## 2015-09-15 DIAGNOSIS — D631 Anemia in chronic kidney disease: Secondary | ICD-10-CM | POA: Diagnosis not present

## 2015-09-15 DIAGNOSIS — D630 Anemia in neoplastic disease: Secondary | ICD-10-CM | POA: Diagnosis not present

## 2015-09-15 DIAGNOSIS — D509 Iron deficiency anemia, unspecified: Secondary | ICD-10-CM | POA: Diagnosis not present

## 2015-09-15 DIAGNOSIS — E784 Other hyperlipidemia: Secondary | ICD-10-CM | POA: Diagnosis not present

## 2015-09-16 DIAGNOSIS — D509 Iron deficiency anemia, unspecified: Secondary | ICD-10-CM | POA: Diagnosis not present

## 2015-09-16 DIAGNOSIS — N186 End stage renal disease: Secondary | ICD-10-CM | POA: Diagnosis not present

## 2015-09-16 DIAGNOSIS — D630 Anemia in neoplastic disease: Secondary | ICD-10-CM | POA: Diagnosis not present

## 2015-09-16 DIAGNOSIS — D513 Other dietary vitamin B12 deficiency anemia: Secondary | ICD-10-CM | POA: Diagnosis not present

## 2015-09-16 DIAGNOSIS — K769 Liver disease, unspecified: Secondary | ICD-10-CM | POA: Diagnosis not present

## 2015-09-16 DIAGNOSIS — N2589 Other disorders resulting from impaired renal tubular function: Secondary | ICD-10-CM | POA: Diagnosis not present

## 2015-09-17 DIAGNOSIS — N186 End stage renal disease: Secondary | ICD-10-CM | POA: Diagnosis not present

## 2015-09-17 DIAGNOSIS — D513 Other dietary vitamin B12 deficiency anemia: Secondary | ICD-10-CM | POA: Diagnosis not present

## 2015-09-17 DIAGNOSIS — N2589 Other disorders resulting from impaired renal tubular function: Secondary | ICD-10-CM | POA: Diagnosis not present

## 2015-09-17 DIAGNOSIS — K769 Liver disease, unspecified: Secondary | ICD-10-CM | POA: Diagnosis not present

## 2015-09-17 DIAGNOSIS — D630 Anemia in neoplastic disease: Secondary | ICD-10-CM | POA: Diagnosis not present

## 2015-09-17 DIAGNOSIS — D509 Iron deficiency anemia, unspecified: Secondary | ICD-10-CM | POA: Diagnosis not present

## 2015-09-18 DIAGNOSIS — K769 Liver disease, unspecified: Secondary | ICD-10-CM | POA: Diagnosis not present

## 2015-09-18 DIAGNOSIS — D509 Iron deficiency anemia, unspecified: Secondary | ICD-10-CM | POA: Diagnosis not present

## 2015-09-18 DIAGNOSIS — D513 Other dietary vitamin B12 deficiency anemia: Secondary | ICD-10-CM | POA: Diagnosis not present

## 2015-09-18 DIAGNOSIS — N2589 Other disorders resulting from impaired renal tubular function: Secondary | ICD-10-CM | POA: Diagnosis not present

## 2015-09-18 DIAGNOSIS — D630 Anemia in neoplastic disease: Secondary | ICD-10-CM | POA: Diagnosis not present

## 2015-09-18 DIAGNOSIS — N186 End stage renal disease: Secondary | ICD-10-CM | POA: Diagnosis not present

## 2015-09-19 DIAGNOSIS — D513 Other dietary vitamin B12 deficiency anemia: Secondary | ICD-10-CM | POA: Diagnosis not present

## 2015-09-19 DIAGNOSIS — N186 End stage renal disease: Secondary | ICD-10-CM | POA: Diagnosis not present

## 2015-09-19 DIAGNOSIS — K769 Liver disease, unspecified: Secondary | ICD-10-CM | POA: Diagnosis not present

## 2015-09-19 DIAGNOSIS — D630 Anemia in neoplastic disease: Secondary | ICD-10-CM | POA: Diagnosis not present

## 2015-09-19 DIAGNOSIS — N2589 Other disorders resulting from impaired renal tubular function: Secondary | ICD-10-CM | POA: Diagnosis not present

## 2015-09-19 DIAGNOSIS — D509 Iron deficiency anemia, unspecified: Secondary | ICD-10-CM | POA: Diagnosis not present

## 2015-09-20 DIAGNOSIS — D513 Other dietary vitamin B12 deficiency anemia: Secondary | ICD-10-CM | POA: Diagnosis not present

## 2015-09-20 DIAGNOSIS — D630 Anemia in neoplastic disease: Secondary | ICD-10-CM | POA: Diagnosis not present

## 2015-09-20 DIAGNOSIS — N186 End stage renal disease: Secondary | ICD-10-CM | POA: Diagnosis not present

## 2015-09-20 DIAGNOSIS — K769 Liver disease, unspecified: Secondary | ICD-10-CM | POA: Diagnosis not present

## 2015-09-20 DIAGNOSIS — D509 Iron deficiency anemia, unspecified: Secondary | ICD-10-CM | POA: Diagnosis not present

## 2015-09-20 DIAGNOSIS — N2589 Other disorders resulting from impaired renal tubular function: Secondary | ICD-10-CM | POA: Diagnosis not present

## 2015-09-21 DIAGNOSIS — N2589 Other disorders resulting from impaired renal tubular function: Secondary | ICD-10-CM | POA: Diagnosis not present

## 2015-09-21 DIAGNOSIS — D509 Iron deficiency anemia, unspecified: Secondary | ICD-10-CM | POA: Diagnosis not present

## 2015-09-21 DIAGNOSIS — D513 Other dietary vitamin B12 deficiency anemia: Secondary | ICD-10-CM | POA: Diagnosis not present

## 2015-09-21 DIAGNOSIS — K769 Liver disease, unspecified: Secondary | ICD-10-CM | POA: Diagnosis not present

## 2015-09-21 DIAGNOSIS — D630 Anemia in neoplastic disease: Secondary | ICD-10-CM | POA: Diagnosis not present

## 2015-09-21 DIAGNOSIS — N186 End stage renal disease: Secondary | ICD-10-CM | POA: Diagnosis not present

## 2015-09-22 DIAGNOSIS — N186 End stage renal disease: Secondary | ICD-10-CM | POA: Diagnosis not present

## 2015-09-22 DIAGNOSIS — N2589 Other disorders resulting from impaired renal tubular function: Secondary | ICD-10-CM | POA: Diagnosis not present

## 2015-09-22 DIAGNOSIS — K769 Liver disease, unspecified: Secondary | ICD-10-CM | POA: Diagnosis not present

## 2015-09-22 DIAGNOSIS — D513 Other dietary vitamin B12 deficiency anemia: Secondary | ICD-10-CM | POA: Diagnosis not present

## 2015-09-22 DIAGNOSIS — D630 Anemia in neoplastic disease: Secondary | ICD-10-CM | POA: Diagnosis not present

## 2015-09-22 DIAGNOSIS — D509 Iron deficiency anemia, unspecified: Secondary | ICD-10-CM | POA: Diagnosis not present

## 2015-09-23 DIAGNOSIS — N2589 Other disorders resulting from impaired renal tubular function: Secondary | ICD-10-CM | POA: Diagnosis not present

## 2015-09-23 DIAGNOSIS — D630 Anemia in neoplastic disease: Secondary | ICD-10-CM | POA: Diagnosis not present

## 2015-09-23 DIAGNOSIS — N186 End stage renal disease: Secondary | ICD-10-CM | POA: Diagnosis not present

## 2015-09-23 DIAGNOSIS — D509 Iron deficiency anemia, unspecified: Secondary | ICD-10-CM | POA: Diagnosis not present

## 2015-09-23 DIAGNOSIS — D513 Other dietary vitamin B12 deficiency anemia: Secondary | ICD-10-CM | POA: Diagnosis not present

## 2015-09-23 DIAGNOSIS — K769 Liver disease, unspecified: Secondary | ICD-10-CM | POA: Diagnosis not present

## 2015-09-24 DIAGNOSIS — N186 End stage renal disease: Secondary | ICD-10-CM | POA: Diagnosis not present

## 2015-09-24 DIAGNOSIS — D630 Anemia in neoplastic disease: Secondary | ICD-10-CM | POA: Diagnosis not present

## 2015-09-24 DIAGNOSIS — D509 Iron deficiency anemia, unspecified: Secondary | ICD-10-CM | POA: Diagnosis not present

## 2015-09-24 DIAGNOSIS — N2589 Other disorders resulting from impaired renal tubular function: Secondary | ICD-10-CM | POA: Diagnosis not present

## 2015-09-24 DIAGNOSIS — K769 Liver disease, unspecified: Secondary | ICD-10-CM | POA: Diagnosis not present

## 2015-09-24 DIAGNOSIS — D513 Other dietary vitamin B12 deficiency anemia: Secondary | ICD-10-CM | POA: Diagnosis not present

## 2015-09-25 DIAGNOSIS — D513 Other dietary vitamin B12 deficiency anemia: Secondary | ICD-10-CM | POA: Diagnosis not present

## 2015-09-25 DIAGNOSIS — N186 End stage renal disease: Secondary | ICD-10-CM | POA: Diagnosis not present

## 2015-09-25 DIAGNOSIS — N2589 Other disorders resulting from impaired renal tubular function: Secondary | ICD-10-CM | POA: Diagnosis not present

## 2015-09-25 DIAGNOSIS — D630 Anemia in neoplastic disease: Secondary | ICD-10-CM | POA: Diagnosis not present

## 2015-09-25 DIAGNOSIS — K769 Liver disease, unspecified: Secondary | ICD-10-CM | POA: Diagnosis not present

## 2015-09-25 DIAGNOSIS — D509 Iron deficiency anemia, unspecified: Secondary | ICD-10-CM | POA: Diagnosis not present

## 2015-09-26 DIAGNOSIS — K769 Liver disease, unspecified: Secondary | ICD-10-CM | POA: Diagnosis not present

## 2015-09-26 DIAGNOSIS — D513 Other dietary vitamin B12 deficiency anemia: Secondary | ICD-10-CM | POA: Diagnosis not present

## 2015-09-26 DIAGNOSIS — N2589 Other disorders resulting from impaired renal tubular function: Secondary | ICD-10-CM | POA: Diagnosis not present

## 2015-09-26 DIAGNOSIS — D509 Iron deficiency anemia, unspecified: Secondary | ICD-10-CM | POA: Diagnosis not present

## 2015-09-26 DIAGNOSIS — D630 Anemia in neoplastic disease: Secondary | ICD-10-CM | POA: Diagnosis not present

## 2015-09-26 DIAGNOSIS — N186 End stage renal disease: Secondary | ICD-10-CM | POA: Diagnosis not present

## 2015-09-27 DIAGNOSIS — D630 Anemia in neoplastic disease: Secondary | ICD-10-CM | POA: Diagnosis not present

## 2015-09-27 DIAGNOSIS — K769 Liver disease, unspecified: Secondary | ICD-10-CM | POA: Diagnosis not present

## 2015-09-27 DIAGNOSIS — D509 Iron deficiency anemia, unspecified: Secondary | ICD-10-CM | POA: Diagnosis not present

## 2015-09-27 DIAGNOSIS — N2589 Other disorders resulting from impaired renal tubular function: Secondary | ICD-10-CM | POA: Diagnosis not present

## 2015-09-27 DIAGNOSIS — N186 End stage renal disease: Secondary | ICD-10-CM | POA: Diagnosis not present

## 2015-09-27 DIAGNOSIS — D513 Other dietary vitamin B12 deficiency anemia: Secondary | ICD-10-CM | POA: Diagnosis not present

## 2015-09-28 DIAGNOSIS — N186 End stage renal disease: Secondary | ICD-10-CM | POA: Diagnosis not present

## 2015-09-28 DIAGNOSIS — D509 Iron deficiency anemia, unspecified: Secondary | ICD-10-CM | POA: Diagnosis not present

## 2015-09-28 DIAGNOSIS — D630 Anemia in neoplastic disease: Secondary | ICD-10-CM | POA: Diagnosis not present

## 2015-09-28 DIAGNOSIS — N2589 Other disorders resulting from impaired renal tubular function: Secondary | ICD-10-CM | POA: Diagnosis not present

## 2015-09-28 DIAGNOSIS — K769 Liver disease, unspecified: Secondary | ICD-10-CM | POA: Diagnosis not present

## 2015-09-28 DIAGNOSIS — D513 Other dietary vitamin B12 deficiency anemia: Secondary | ICD-10-CM | POA: Diagnosis not present

## 2015-09-29 DIAGNOSIS — D513 Other dietary vitamin B12 deficiency anemia: Secondary | ICD-10-CM | POA: Diagnosis not present

## 2015-09-29 DIAGNOSIS — N2589 Other disorders resulting from impaired renal tubular function: Secondary | ICD-10-CM | POA: Diagnosis not present

## 2015-09-29 DIAGNOSIS — N186 End stage renal disease: Secondary | ICD-10-CM | POA: Diagnosis not present

## 2015-09-29 DIAGNOSIS — D630 Anemia in neoplastic disease: Secondary | ICD-10-CM | POA: Diagnosis not present

## 2015-09-29 DIAGNOSIS — D509 Iron deficiency anemia, unspecified: Secondary | ICD-10-CM | POA: Diagnosis not present

## 2015-09-29 DIAGNOSIS — K769 Liver disease, unspecified: Secondary | ICD-10-CM | POA: Diagnosis not present

## 2015-09-30 DIAGNOSIS — K769 Liver disease, unspecified: Secondary | ICD-10-CM | POA: Diagnosis not present

## 2015-09-30 DIAGNOSIS — N2589 Other disorders resulting from impaired renal tubular function: Secondary | ICD-10-CM | POA: Diagnosis not present

## 2015-09-30 DIAGNOSIS — D513 Other dietary vitamin B12 deficiency anemia: Secondary | ICD-10-CM | POA: Diagnosis not present

## 2015-09-30 DIAGNOSIS — D509 Iron deficiency anemia, unspecified: Secondary | ICD-10-CM | POA: Diagnosis not present

## 2015-09-30 DIAGNOSIS — N186 End stage renal disease: Secondary | ICD-10-CM | POA: Diagnosis not present

## 2015-09-30 DIAGNOSIS — D630 Anemia in neoplastic disease: Secondary | ICD-10-CM | POA: Diagnosis not present

## 2015-10-01 DIAGNOSIS — D509 Iron deficiency anemia, unspecified: Secondary | ICD-10-CM | POA: Diagnosis not present

## 2015-10-01 DIAGNOSIS — N2589 Other disorders resulting from impaired renal tubular function: Secondary | ICD-10-CM | POA: Diagnosis not present

## 2015-10-01 DIAGNOSIS — K769 Liver disease, unspecified: Secondary | ICD-10-CM | POA: Diagnosis not present

## 2015-10-01 DIAGNOSIS — D513 Other dietary vitamin B12 deficiency anemia: Secondary | ICD-10-CM | POA: Diagnosis not present

## 2015-10-01 DIAGNOSIS — N186 End stage renal disease: Secondary | ICD-10-CM | POA: Diagnosis not present

## 2015-10-01 DIAGNOSIS — D630 Anemia in neoplastic disease: Secondary | ICD-10-CM | POA: Diagnosis not present

## 2015-10-02 DIAGNOSIS — D630 Anemia in neoplastic disease: Secondary | ICD-10-CM | POA: Diagnosis not present

## 2015-10-02 DIAGNOSIS — D513 Other dietary vitamin B12 deficiency anemia: Secondary | ICD-10-CM | POA: Diagnosis not present

## 2015-10-02 DIAGNOSIS — D509 Iron deficiency anemia, unspecified: Secondary | ICD-10-CM | POA: Diagnosis not present

## 2015-10-02 DIAGNOSIS — K769 Liver disease, unspecified: Secondary | ICD-10-CM | POA: Diagnosis not present

## 2015-10-02 DIAGNOSIS — N186 End stage renal disease: Secondary | ICD-10-CM | POA: Diagnosis not present

## 2015-10-02 DIAGNOSIS — N2589 Other disorders resulting from impaired renal tubular function: Secondary | ICD-10-CM | POA: Diagnosis not present

## 2015-10-03 DIAGNOSIS — D513 Other dietary vitamin B12 deficiency anemia: Secondary | ICD-10-CM | POA: Diagnosis not present

## 2015-10-03 DIAGNOSIS — D509 Iron deficiency anemia, unspecified: Secondary | ICD-10-CM | POA: Diagnosis not present

## 2015-10-03 DIAGNOSIS — N186 End stage renal disease: Secondary | ICD-10-CM | POA: Diagnosis not present

## 2015-10-03 DIAGNOSIS — K769 Liver disease, unspecified: Secondary | ICD-10-CM | POA: Diagnosis not present

## 2015-10-03 DIAGNOSIS — D630 Anemia in neoplastic disease: Secondary | ICD-10-CM | POA: Diagnosis not present

## 2015-10-03 DIAGNOSIS — N2589 Other disorders resulting from impaired renal tubular function: Secondary | ICD-10-CM | POA: Diagnosis not present

## 2015-10-04 DIAGNOSIS — K769 Liver disease, unspecified: Secondary | ICD-10-CM | POA: Diagnosis not present

## 2015-10-04 DIAGNOSIS — D509 Iron deficiency anemia, unspecified: Secondary | ICD-10-CM | POA: Diagnosis not present

## 2015-10-04 DIAGNOSIS — N2589 Other disorders resulting from impaired renal tubular function: Secondary | ICD-10-CM | POA: Diagnosis not present

## 2015-10-04 DIAGNOSIS — D513 Other dietary vitamin B12 deficiency anemia: Secondary | ICD-10-CM | POA: Diagnosis not present

## 2015-10-04 DIAGNOSIS — N186 End stage renal disease: Secondary | ICD-10-CM | POA: Diagnosis not present

## 2015-10-04 DIAGNOSIS — D630 Anemia in neoplastic disease: Secondary | ICD-10-CM | POA: Diagnosis not present

## 2015-10-05 DIAGNOSIS — N186 End stage renal disease: Secondary | ICD-10-CM | POA: Diagnosis not present

## 2015-10-05 DIAGNOSIS — D509 Iron deficiency anemia, unspecified: Secondary | ICD-10-CM | POA: Diagnosis not present

## 2015-10-05 DIAGNOSIS — D513 Other dietary vitamin B12 deficiency anemia: Secondary | ICD-10-CM | POA: Diagnosis not present

## 2015-10-05 DIAGNOSIS — K769 Liver disease, unspecified: Secondary | ICD-10-CM | POA: Diagnosis not present

## 2015-10-05 DIAGNOSIS — N2589 Other disorders resulting from impaired renal tubular function: Secondary | ICD-10-CM | POA: Diagnosis not present

## 2015-10-05 DIAGNOSIS — D630 Anemia in neoplastic disease: Secondary | ICD-10-CM | POA: Diagnosis not present

## 2015-10-06 DIAGNOSIS — D509 Iron deficiency anemia, unspecified: Secondary | ICD-10-CM | POA: Diagnosis not present

## 2015-10-06 DIAGNOSIS — D630 Anemia in neoplastic disease: Secondary | ICD-10-CM | POA: Diagnosis not present

## 2015-10-06 DIAGNOSIS — N2589 Other disorders resulting from impaired renal tubular function: Secondary | ICD-10-CM | POA: Diagnosis not present

## 2015-10-06 DIAGNOSIS — N186 End stage renal disease: Secondary | ICD-10-CM | POA: Diagnosis not present

## 2015-10-06 DIAGNOSIS — K769 Liver disease, unspecified: Secondary | ICD-10-CM | POA: Diagnosis not present

## 2015-10-06 DIAGNOSIS — D513 Other dietary vitamin B12 deficiency anemia: Secondary | ICD-10-CM | POA: Diagnosis not present

## 2015-10-07 DIAGNOSIS — K769 Liver disease, unspecified: Secondary | ICD-10-CM | POA: Diagnosis not present

## 2015-10-07 DIAGNOSIS — N186 End stage renal disease: Secondary | ICD-10-CM | POA: Diagnosis not present

## 2015-10-07 DIAGNOSIS — D509 Iron deficiency anemia, unspecified: Secondary | ICD-10-CM | POA: Diagnosis not present

## 2015-10-07 DIAGNOSIS — D513 Other dietary vitamin B12 deficiency anemia: Secondary | ICD-10-CM | POA: Diagnosis not present

## 2015-10-07 DIAGNOSIS — D630 Anemia in neoplastic disease: Secondary | ICD-10-CM | POA: Diagnosis not present

## 2015-10-07 DIAGNOSIS — N2589 Other disorders resulting from impaired renal tubular function: Secondary | ICD-10-CM | POA: Diagnosis not present

## 2015-10-08 DIAGNOSIS — D513 Other dietary vitamin B12 deficiency anemia: Secondary | ICD-10-CM | POA: Diagnosis not present

## 2015-10-08 DIAGNOSIS — N2589 Other disorders resulting from impaired renal tubular function: Secondary | ICD-10-CM | POA: Diagnosis not present

## 2015-10-08 DIAGNOSIS — D509 Iron deficiency anemia, unspecified: Secondary | ICD-10-CM | POA: Diagnosis not present

## 2015-10-08 DIAGNOSIS — N186 End stage renal disease: Secondary | ICD-10-CM | POA: Diagnosis not present

## 2015-10-08 DIAGNOSIS — D630 Anemia in neoplastic disease: Secondary | ICD-10-CM | POA: Diagnosis not present

## 2015-10-08 DIAGNOSIS — K769 Liver disease, unspecified: Secondary | ICD-10-CM | POA: Diagnosis not present

## 2015-10-09 DIAGNOSIS — N186 End stage renal disease: Secondary | ICD-10-CM | POA: Diagnosis not present

## 2015-10-09 DIAGNOSIS — K769 Liver disease, unspecified: Secondary | ICD-10-CM | POA: Diagnosis not present

## 2015-10-09 DIAGNOSIS — D630 Anemia in neoplastic disease: Secondary | ICD-10-CM | POA: Diagnosis not present

## 2015-10-09 DIAGNOSIS — D513 Other dietary vitamin B12 deficiency anemia: Secondary | ICD-10-CM | POA: Diagnosis not present

## 2015-10-09 DIAGNOSIS — N2589 Other disorders resulting from impaired renal tubular function: Secondary | ICD-10-CM | POA: Diagnosis not present

## 2015-10-09 DIAGNOSIS — D509 Iron deficiency anemia, unspecified: Secondary | ICD-10-CM | POA: Diagnosis not present

## 2015-10-10 DIAGNOSIS — D630 Anemia in neoplastic disease: Secondary | ICD-10-CM | POA: Diagnosis not present

## 2015-10-10 DIAGNOSIS — N2589 Other disorders resulting from impaired renal tubular function: Secondary | ICD-10-CM | POA: Diagnosis not present

## 2015-10-10 DIAGNOSIS — N186 End stage renal disease: Secondary | ICD-10-CM | POA: Diagnosis not present

## 2015-10-10 DIAGNOSIS — D513 Other dietary vitamin B12 deficiency anemia: Secondary | ICD-10-CM | POA: Diagnosis not present

## 2015-10-10 DIAGNOSIS — D509 Iron deficiency anemia, unspecified: Secondary | ICD-10-CM | POA: Diagnosis not present

## 2015-10-10 DIAGNOSIS — K769 Liver disease, unspecified: Secondary | ICD-10-CM | POA: Diagnosis not present

## 2015-10-11 DIAGNOSIS — N186 End stage renal disease: Secondary | ICD-10-CM | POA: Diagnosis not present

## 2015-10-11 DIAGNOSIS — D630 Anemia in neoplastic disease: Secondary | ICD-10-CM | POA: Diagnosis not present

## 2015-10-11 DIAGNOSIS — D513 Other dietary vitamin B12 deficiency anemia: Secondary | ICD-10-CM | POA: Diagnosis not present

## 2015-10-11 DIAGNOSIS — N2589 Other disorders resulting from impaired renal tubular function: Secondary | ICD-10-CM | POA: Diagnosis not present

## 2015-10-11 DIAGNOSIS — K769 Liver disease, unspecified: Secondary | ICD-10-CM | POA: Diagnosis not present

## 2015-10-11 DIAGNOSIS — D509 Iron deficiency anemia, unspecified: Secondary | ICD-10-CM | POA: Diagnosis not present

## 2015-10-12 DIAGNOSIS — D509 Iron deficiency anemia, unspecified: Secondary | ICD-10-CM | POA: Diagnosis not present

## 2015-10-12 DIAGNOSIS — K769 Liver disease, unspecified: Secondary | ICD-10-CM | POA: Diagnosis not present

## 2015-10-12 DIAGNOSIS — N186 End stage renal disease: Secondary | ICD-10-CM | POA: Diagnosis not present

## 2015-10-12 DIAGNOSIS — N2589 Other disorders resulting from impaired renal tubular function: Secondary | ICD-10-CM | POA: Diagnosis not present

## 2015-10-12 DIAGNOSIS — D630 Anemia in neoplastic disease: Secondary | ICD-10-CM | POA: Diagnosis not present

## 2015-10-12 DIAGNOSIS — D513 Other dietary vitamin B12 deficiency anemia: Secondary | ICD-10-CM | POA: Diagnosis not present

## 2015-10-13 DIAGNOSIS — K769 Liver disease, unspecified: Secondary | ICD-10-CM | POA: Diagnosis not present

## 2015-10-13 DIAGNOSIS — D513 Other dietary vitamin B12 deficiency anemia: Secondary | ICD-10-CM | POA: Diagnosis not present

## 2015-10-13 DIAGNOSIS — D509 Iron deficiency anemia, unspecified: Secondary | ICD-10-CM | POA: Diagnosis not present

## 2015-10-13 DIAGNOSIS — D630 Anemia in neoplastic disease: Secondary | ICD-10-CM | POA: Diagnosis not present

## 2015-10-13 DIAGNOSIS — N186 End stage renal disease: Secondary | ICD-10-CM | POA: Diagnosis not present

## 2015-10-13 DIAGNOSIS — N2589 Other disorders resulting from impaired renal tubular function: Secondary | ICD-10-CM | POA: Diagnosis not present

## 2015-10-14 DIAGNOSIS — N2589 Other disorders resulting from impaired renal tubular function: Secondary | ICD-10-CM | POA: Diagnosis not present

## 2015-10-14 DIAGNOSIS — D513 Other dietary vitamin B12 deficiency anemia: Secondary | ICD-10-CM | POA: Diagnosis not present

## 2015-10-14 DIAGNOSIS — Z992 Dependence on renal dialysis: Secondary | ICD-10-CM | POA: Diagnosis not present

## 2015-10-14 DIAGNOSIS — K769 Liver disease, unspecified: Secondary | ICD-10-CM | POA: Diagnosis not present

## 2015-10-14 DIAGNOSIS — D630 Anemia in neoplastic disease: Secondary | ICD-10-CM | POA: Diagnosis not present

## 2015-10-14 DIAGNOSIS — D509 Iron deficiency anemia, unspecified: Secondary | ICD-10-CM | POA: Diagnosis not present

## 2015-10-14 DIAGNOSIS — N186 End stage renal disease: Secondary | ICD-10-CM | POA: Diagnosis not present

## 2015-10-15 DIAGNOSIS — Z79899 Other long term (current) drug therapy: Secondary | ICD-10-CM | POA: Diagnosis not present

## 2015-10-15 DIAGNOSIS — Z5189 Encounter for other specified aftercare: Secondary | ICD-10-CM | POA: Diagnosis not present

## 2015-10-15 DIAGNOSIS — N186 End stage renal disease: Secondary | ICD-10-CM | POA: Diagnosis not present

## 2015-10-15 DIAGNOSIS — N2589 Other disorders resulting from impaired renal tubular function: Secondary | ICD-10-CM | POA: Diagnosis not present

## 2015-10-15 DIAGNOSIS — D509 Iron deficiency anemia, unspecified: Secondary | ICD-10-CM | POA: Diagnosis not present

## 2015-10-15 DIAGNOSIS — E440 Moderate protein-calorie malnutrition: Secondary | ICD-10-CM | POA: Diagnosis not present

## 2015-10-15 DIAGNOSIS — D630 Anemia in neoplastic disease: Secondary | ICD-10-CM | POA: Diagnosis not present

## 2015-10-16 DIAGNOSIS — E440 Moderate protein-calorie malnutrition: Secondary | ICD-10-CM | POA: Diagnosis not present

## 2015-10-16 DIAGNOSIS — N186 End stage renal disease: Secondary | ICD-10-CM | POA: Diagnosis not present

## 2015-10-16 DIAGNOSIS — D630 Anemia in neoplastic disease: Secondary | ICD-10-CM | POA: Diagnosis not present

## 2015-10-16 DIAGNOSIS — Z5189 Encounter for other specified aftercare: Secondary | ICD-10-CM | POA: Diagnosis not present

## 2015-10-16 DIAGNOSIS — N2589 Other disorders resulting from impaired renal tubular function: Secondary | ICD-10-CM | POA: Diagnosis not present

## 2015-10-16 DIAGNOSIS — Z79899 Other long term (current) drug therapy: Secondary | ICD-10-CM | POA: Diagnosis not present

## 2015-10-17 DIAGNOSIS — N2589 Other disorders resulting from impaired renal tubular function: Secondary | ICD-10-CM | POA: Diagnosis not present

## 2015-10-17 DIAGNOSIS — N186 End stage renal disease: Secondary | ICD-10-CM | POA: Diagnosis not present

## 2015-10-17 DIAGNOSIS — D630 Anemia in neoplastic disease: Secondary | ICD-10-CM | POA: Diagnosis not present

## 2015-10-17 DIAGNOSIS — E440 Moderate protein-calorie malnutrition: Secondary | ICD-10-CM | POA: Diagnosis not present

## 2015-10-17 DIAGNOSIS — Z5189 Encounter for other specified aftercare: Secondary | ICD-10-CM | POA: Diagnosis not present

## 2015-10-17 DIAGNOSIS — Z79899 Other long term (current) drug therapy: Secondary | ICD-10-CM | POA: Diagnosis not present

## 2015-10-18 DIAGNOSIS — N186 End stage renal disease: Secondary | ICD-10-CM | POA: Diagnosis not present

## 2015-10-18 DIAGNOSIS — Z5189 Encounter for other specified aftercare: Secondary | ICD-10-CM | POA: Diagnosis not present

## 2015-10-18 DIAGNOSIS — D630 Anemia in neoplastic disease: Secondary | ICD-10-CM | POA: Diagnosis not present

## 2015-10-18 DIAGNOSIS — R8299 Other abnormal findings in urine: Secondary | ICD-10-CM | POA: Diagnosis not present

## 2015-10-18 DIAGNOSIS — E440 Moderate protein-calorie malnutrition: Secondary | ICD-10-CM | POA: Diagnosis not present

## 2015-10-18 DIAGNOSIS — Z79899 Other long term (current) drug therapy: Secondary | ICD-10-CM | POA: Diagnosis not present

## 2015-10-18 DIAGNOSIS — N2589 Other disorders resulting from impaired renal tubular function: Secondary | ICD-10-CM | POA: Diagnosis not present

## 2015-10-19 DIAGNOSIS — D630 Anemia in neoplastic disease: Secondary | ICD-10-CM | POA: Diagnosis not present

## 2015-10-19 DIAGNOSIS — N186 End stage renal disease: Secondary | ICD-10-CM | POA: Diagnosis not present

## 2015-10-19 DIAGNOSIS — N2589 Other disorders resulting from impaired renal tubular function: Secondary | ICD-10-CM | POA: Diagnosis not present

## 2015-10-19 DIAGNOSIS — E440 Moderate protein-calorie malnutrition: Secondary | ICD-10-CM | POA: Diagnosis not present

## 2015-10-19 DIAGNOSIS — Z5189 Encounter for other specified aftercare: Secondary | ICD-10-CM | POA: Diagnosis not present

## 2015-10-19 DIAGNOSIS — Z79899 Other long term (current) drug therapy: Secondary | ICD-10-CM | POA: Diagnosis not present

## 2015-10-20 DIAGNOSIS — Z79899 Other long term (current) drug therapy: Secondary | ICD-10-CM | POA: Diagnosis not present

## 2015-10-20 DIAGNOSIS — N186 End stage renal disease: Secondary | ICD-10-CM | POA: Diagnosis not present

## 2015-10-20 DIAGNOSIS — D630 Anemia in neoplastic disease: Secondary | ICD-10-CM | POA: Diagnosis not present

## 2015-10-20 DIAGNOSIS — Z5189 Encounter for other specified aftercare: Secondary | ICD-10-CM | POA: Diagnosis not present

## 2015-10-20 DIAGNOSIS — E440 Moderate protein-calorie malnutrition: Secondary | ICD-10-CM | POA: Diagnosis not present

## 2015-10-20 DIAGNOSIS — N2589 Other disorders resulting from impaired renal tubular function: Secondary | ICD-10-CM | POA: Diagnosis not present

## 2015-10-21 DIAGNOSIS — Z5189 Encounter for other specified aftercare: Secondary | ICD-10-CM | POA: Diagnosis not present

## 2015-10-21 DIAGNOSIS — E440 Moderate protein-calorie malnutrition: Secondary | ICD-10-CM | POA: Diagnosis not present

## 2015-10-21 DIAGNOSIS — D630 Anemia in neoplastic disease: Secondary | ICD-10-CM | POA: Diagnosis not present

## 2015-10-21 DIAGNOSIS — N186 End stage renal disease: Secondary | ICD-10-CM | POA: Diagnosis not present

## 2015-10-21 DIAGNOSIS — Z79899 Other long term (current) drug therapy: Secondary | ICD-10-CM | POA: Diagnosis not present

## 2015-10-21 DIAGNOSIS — N2589 Other disorders resulting from impaired renal tubular function: Secondary | ICD-10-CM | POA: Diagnosis not present

## 2015-10-22 DIAGNOSIS — Z79899 Other long term (current) drug therapy: Secondary | ICD-10-CM | POA: Diagnosis not present

## 2015-10-22 DIAGNOSIS — N2589 Other disorders resulting from impaired renal tubular function: Secondary | ICD-10-CM | POA: Diagnosis not present

## 2015-10-22 DIAGNOSIS — D630 Anemia in neoplastic disease: Secondary | ICD-10-CM | POA: Diagnosis not present

## 2015-10-22 DIAGNOSIS — E440 Moderate protein-calorie malnutrition: Secondary | ICD-10-CM | POA: Diagnosis not present

## 2015-10-22 DIAGNOSIS — Z5189 Encounter for other specified aftercare: Secondary | ICD-10-CM | POA: Diagnosis not present

## 2015-10-22 DIAGNOSIS — N186 End stage renal disease: Secondary | ICD-10-CM | POA: Diagnosis not present

## 2015-10-23 DIAGNOSIS — D630 Anemia in neoplastic disease: Secondary | ICD-10-CM | POA: Diagnosis not present

## 2015-10-23 DIAGNOSIS — N2589 Other disorders resulting from impaired renal tubular function: Secondary | ICD-10-CM | POA: Diagnosis not present

## 2015-10-23 DIAGNOSIS — E440 Moderate protein-calorie malnutrition: Secondary | ICD-10-CM | POA: Diagnosis not present

## 2015-10-23 DIAGNOSIS — Z5189 Encounter for other specified aftercare: Secondary | ICD-10-CM | POA: Diagnosis not present

## 2015-10-23 DIAGNOSIS — N186 End stage renal disease: Secondary | ICD-10-CM | POA: Diagnosis not present

## 2015-10-23 DIAGNOSIS — Z79899 Other long term (current) drug therapy: Secondary | ICD-10-CM | POA: Diagnosis not present

## 2015-10-24 DIAGNOSIS — N2589 Other disorders resulting from impaired renal tubular function: Secondary | ICD-10-CM | POA: Diagnosis not present

## 2015-10-24 DIAGNOSIS — E440 Moderate protein-calorie malnutrition: Secondary | ICD-10-CM | POA: Diagnosis not present

## 2015-10-24 DIAGNOSIS — Z79899 Other long term (current) drug therapy: Secondary | ICD-10-CM | POA: Diagnosis not present

## 2015-10-24 DIAGNOSIS — Z5189 Encounter for other specified aftercare: Secondary | ICD-10-CM | POA: Diagnosis not present

## 2015-10-24 DIAGNOSIS — N186 End stage renal disease: Secondary | ICD-10-CM | POA: Diagnosis not present

## 2015-10-24 DIAGNOSIS — D630 Anemia in neoplastic disease: Secondary | ICD-10-CM | POA: Diagnosis not present

## 2015-10-25 DIAGNOSIS — Z5189 Encounter for other specified aftercare: Secondary | ICD-10-CM | POA: Diagnosis not present

## 2015-10-25 DIAGNOSIS — N2589 Other disorders resulting from impaired renal tubular function: Secondary | ICD-10-CM | POA: Diagnosis not present

## 2015-10-25 DIAGNOSIS — E440 Moderate protein-calorie malnutrition: Secondary | ICD-10-CM | POA: Diagnosis not present

## 2015-10-25 DIAGNOSIS — Z79899 Other long term (current) drug therapy: Secondary | ICD-10-CM | POA: Diagnosis not present

## 2015-10-25 DIAGNOSIS — D630 Anemia in neoplastic disease: Secondary | ICD-10-CM | POA: Diagnosis not present

## 2015-10-25 DIAGNOSIS — N186 End stage renal disease: Secondary | ICD-10-CM | POA: Diagnosis not present

## 2015-10-26 DIAGNOSIS — Z5189 Encounter for other specified aftercare: Secondary | ICD-10-CM | POA: Diagnosis not present

## 2015-10-26 DIAGNOSIS — N2589 Other disorders resulting from impaired renal tubular function: Secondary | ICD-10-CM | POA: Diagnosis not present

## 2015-10-26 DIAGNOSIS — Z79899 Other long term (current) drug therapy: Secondary | ICD-10-CM | POA: Diagnosis not present

## 2015-10-26 DIAGNOSIS — N186 End stage renal disease: Secondary | ICD-10-CM | POA: Diagnosis not present

## 2015-10-26 DIAGNOSIS — E440 Moderate protein-calorie malnutrition: Secondary | ICD-10-CM | POA: Diagnosis not present

## 2015-10-26 DIAGNOSIS — D630 Anemia in neoplastic disease: Secondary | ICD-10-CM | POA: Diagnosis not present

## 2015-10-27 DIAGNOSIS — N2589 Other disorders resulting from impaired renal tubular function: Secondary | ICD-10-CM | POA: Diagnosis not present

## 2015-10-27 DIAGNOSIS — E440 Moderate protein-calorie malnutrition: Secondary | ICD-10-CM | POA: Diagnosis not present

## 2015-10-27 DIAGNOSIS — N186 End stage renal disease: Secondary | ICD-10-CM | POA: Diagnosis not present

## 2015-10-27 DIAGNOSIS — Z5189 Encounter for other specified aftercare: Secondary | ICD-10-CM | POA: Diagnosis not present

## 2015-10-27 DIAGNOSIS — D630 Anemia in neoplastic disease: Secondary | ICD-10-CM | POA: Diagnosis not present

## 2015-10-27 DIAGNOSIS — Z79899 Other long term (current) drug therapy: Secondary | ICD-10-CM | POA: Diagnosis not present

## 2015-10-28 DIAGNOSIS — D630 Anemia in neoplastic disease: Secondary | ICD-10-CM | POA: Diagnosis not present

## 2015-10-28 DIAGNOSIS — Z79899 Other long term (current) drug therapy: Secondary | ICD-10-CM | POA: Diagnosis not present

## 2015-10-28 DIAGNOSIS — N2589 Other disorders resulting from impaired renal tubular function: Secondary | ICD-10-CM | POA: Diagnosis not present

## 2015-10-28 DIAGNOSIS — E440 Moderate protein-calorie malnutrition: Secondary | ICD-10-CM | POA: Diagnosis not present

## 2015-10-28 DIAGNOSIS — Z5189 Encounter for other specified aftercare: Secondary | ICD-10-CM | POA: Diagnosis not present

## 2015-10-28 DIAGNOSIS — N186 End stage renal disease: Secondary | ICD-10-CM | POA: Diagnosis not present

## 2015-10-29 DIAGNOSIS — Z79899 Other long term (current) drug therapy: Secondary | ICD-10-CM | POA: Diagnosis not present

## 2015-10-29 DIAGNOSIS — N2589 Other disorders resulting from impaired renal tubular function: Secondary | ICD-10-CM | POA: Diagnosis not present

## 2015-10-29 DIAGNOSIS — Z5189 Encounter for other specified aftercare: Secondary | ICD-10-CM | POA: Diagnosis not present

## 2015-10-29 DIAGNOSIS — D630 Anemia in neoplastic disease: Secondary | ICD-10-CM | POA: Diagnosis not present

## 2015-10-29 DIAGNOSIS — E440 Moderate protein-calorie malnutrition: Secondary | ICD-10-CM | POA: Diagnosis not present

## 2015-10-29 DIAGNOSIS — N186 End stage renal disease: Secondary | ICD-10-CM | POA: Diagnosis not present

## 2015-10-30 DIAGNOSIS — N2589 Other disorders resulting from impaired renal tubular function: Secondary | ICD-10-CM | POA: Diagnosis not present

## 2015-10-30 DIAGNOSIS — D630 Anemia in neoplastic disease: Secondary | ICD-10-CM | POA: Diagnosis not present

## 2015-10-30 DIAGNOSIS — N186 End stage renal disease: Secondary | ICD-10-CM | POA: Diagnosis not present

## 2015-10-30 DIAGNOSIS — Z5189 Encounter for other specified aftercare: Secondary | ICD-10-CM | POA: Diagnosis not present

## 2015-10-30 DIAGNOSIS — Z79899 Other long term (current) drug therapy: Secondary | ICD-10-CM | POA: Diagnosis not present

## 2015-10-30 DIAGNOSIS — E440 Moderate protein-calorie malnutrition: Secondary | ICD-10-CM | POA: Diagnosis not present

## 2015-10-31 DIAGNOSIS — N2589 Other disorders resulting from impaired renal tubular function: Secondary | ICD-10-CM | POA: Diagnosis not present

## 2015-10-31 DIAGNOSIS — N186 End stage renal disease: Secondary | ICD-10-CM | POA: Diagnosis not present

## 2015-10-31 DIAGNOSIS — Z79899 Other long term (current) drug therapy: Secondary | ICD-10-CM | POA: Diagnosis not present

## 2015-10-31 DIAGNOSIS — D630 Anemia in neoplastic disease: Secondary | ICD-10-CM | POA: Diagnosis not present

## 2015-10-31 DIAGNOSIS — E440 Moderate protein-calorie malnutrition: Secondary | ICD-10-CM | POA: Diagnosis not present

## 2015-10-31 DIAGNOSIS — Z5189 Encounter for other specified aftercare: Secondary | ICD-10-CM | POA: Diagnosis not present

## 2015-11-01 DIAGNOSIS — D630 Anemia in neoplastic disease: Secondary | ICD-10-CM | POA: Diagnosis not present

## 2015-11-01 DIAGNOSIS — Z5189 Encounter for other specified aftercare: Secondary | ICD-10-CM | POA: Diagnosis not present

## 2015-11-01 DIAGNOSIS — E440 Moderate protein-calorie malnutrition: Secondary | ICD-10-CM | POA: Diagnosis not present

## 2015-11-01 DIAGNOSIS — N186 End stage renal disease: Secondary | ICD-10-CM | POA: Diagnosis not present

## 2015-11-01 DIAGNOSIS — Z79899 Other long term (current) drug therapy: Secondary | ICD-10-CM | POA: Diagnosis not present

## 2015-11-01 DIAGNOSIS — N2589 Other disorders resulting from impaired renal tubular function: Secondary | ICD-10-CM | POA: Diagnosis not present

## 2015-11-02 DIAGNOSIS — Z5189 Encounter for other specified aftercare: Secondary | ICD-10-CM | POA: Diagnosis not present

## 2015-11-02 DIAGNOSIS — D630 Anemia in neoplastic disease: Secondary | ICD-10-CM | POA: Diagnosis not present

## 2015-11-02 DIAGNOSIS — Z79899 Other long term (current) drug therapy: Secondary | ICD-10-CM | POA: Diagnosis not present

## 2015-11-02 DIAGNOSIS — E440 Moderate protein-calorie malnutrition: Secondary | ICD-10-CM | POA: Diagnosis not present

## 2015-11-02 DIAGNOSIS — N186 End stage renal disease: Secondary | ICD-10-CM | POA: Diagnosis not present

## 2015-11-02 DIAGNOSIS — N2589 Other disorders resulting from impaired renal tubular function: Secondary | ICD-10-CM | POA: Diagnosis not present

## 2015-11-03 DIAGNOSIS — Z79899 Other long term (current) drug therapy: Secondary | ICD-10-CM | POA: Diagnosis not present

## 2015-11-03 DIAGNOSIS — D630 Anemia in neoplastic disease: Secondary | ICD-10-CM | POA: Diagnosis not present

## 2015-11-03 DIAGNOSIS — Z5189 Encounter for other specified aftercare: Secondary | ICD-10-CM | POA: Diagnosis not present

## 2015-11-03 DIAGNOSIS — E440 Moderate protein-calorie malnutrition: Secondary | ICD-10-CM | POA: Diagnosis not present

## 2015-11-03 DIAGNOSIS — N186 End stage renal disease: Secondary | ICD-10-CM | POA: Diagnosis not present

## 2015-11-03 DIAGNOSIS — N2589 Other disorders resulting from impaired renal tubular function: Secondary | ICD-10-CM | POA: Diagnosis not present

## 2015-11-04 DIAGNOSIS — Z5189 Encounter for other specified aftercare: Secondary | ICD-10-CM | POA: Diagnosis not present

## 2015-11-04 DIAGNOSIS — D630 Anemia in neoplastic disease: Secondary | ICD-10-CM | POA: Diagnosis not present

## 2015-11-04 DIAGNOSIS — N186 End stage renal disease: Secondary | ICD-10-CM | POA: Diagnosis not present

## 2015-11-04 DIAGNOSIS — E440 Moderate protein-calorie malnutrition: Secondary | ICD-10-CM | POA: Diagnosis not present

## 2015-11-04 DIAGNOSIS — N2589 Other disorders resulting from impaired renal tubular function: Secondary | ICD-10-CM | POA: Diagnosis not present

## 2015-11-04 DIAGNOSIS — Z79899 Other long term (current) drug therapy: Secondary | ICD-10-CM | POA: Diagnosis not present

## 2015-11-05 DIAGNOSIS — E440 Moderate protein-calorie malnutrition: Secondary | ICD-10-CM | POA: Diagnosis not present

## 2015-11-05 DIAGNOSIS — N186 End stage renal disease: Secondary | ICD-10-CM | POA: Diagnosis not present

## 2015-11-05 DIAGNOSIS — N2589 Other disorders resulting from impaired renal tubular function: Secondary | ICD-10-CM | POA: Diagnosis not present

## 2015-11-05 DIAGNOSIS — Z5189 Encounter for other specified aftercare: Secondary | ICD-10-CM | POA: Diagnosis not present

## 2015-11-05 DIAGNOSIS — D630 Anemia in neoplastic disease: Secondary | ICD-10-CM | POA: Diagnosis not present

## 2015-11-05 DIAGNOSIS — Z79899 Other long term (current) drug therapy: Secondary | ICD-10-CM | POA: Diagnosis not present

## 2015-11-06 DIAGNOSIS — Z5189 Encounter for other specified aftercare: Secondary | ICD-10-CM | POA: Diagnosis not present

## 2015-11-06 DIAGNOSIS — N2589 Other disorders resulting from impaired renal tubular function: Secondary | ICD-10-CM | POA: Diagnosis not present

## 2015-11-06 DIAGNOSIS — N186 End stage renal disease: Secondary | ICD-10-CM | POA: Diagnosis not present

## 2015-11-06 DIAGNOSIS — Z79899 Other long term (current) drug therapy: Secondary | ICD-10-CM | POA: Diagnosis not present

## 2015-11-06 DIAGNOSIS — D630 Anemia in neoplastic disease: Secondary | ICD-10-CM | POA: Diagnosis not present

## 2015-11-06 DIAGNOSIS — E440 Moderate protein-calorie malnutrition: Secondary | ICD-10-CM | POA: Diagnosis not present

## 2015-11-07 DIAGNOSIS — Z79899 Other long term (current) drug therapy: Secondary | ICD-10-CM | POA: Diagnosis not present

## 2015-11-07 DIAGNOSIS — E440 Moderate protein-calorie malnutrition: Secondary | ICD-10-CM | POA: Diagnosis not present

## 2015-11-07 DIAGNOSIS — Z5189 Encounter for other specified aftercare: Secondary | ICD-10-CM | POA: Diagnosis not present

## 2015-11-07 DIAGNOSIS — D630 Anemia in neoplastic disease: Secondary | ICD-10-CM | POA: Diagnosis not present

## 2015-11-07 DIAGNOSIS — N186 End stage renal disease: Secondary | ICD-10-CM | POA: Diagnosis not present

## 2015-11-07 DIAGNOSIS — N2589 Other disorders resulting from impaired renal tubular function: Secondary | ICD-10-CM | POA: Diagnosis not present

## 2015-11-08 DIAGNOSIS — D630 Anemia in neoplastic disease: Secondary | ICD-10-CM | POA: Diagnosis not present

## 2015-11-08 DIAGNOSIS — E440 Moderate protein-calorie malnutrition: Secondary | ICD-10-CM | POA: Diagnosis not present

## 2015-11-08 DIAGNOSIS — Z5189 Encounter for other specified aftercare: Secondary | ICD-10-CM | POA: Diagnosis not present

## 2015-11-08 DIAGNOSIS — Z79899 Other long term (current) drug therapy: Secondary | ICD-10-CM | POA: Diagnosis not present

## 2015-11-08 DIAGNOSIS — N186 End stage renal disease: Secondary | ICD-10-CM | POA: Diagnosis not present

## 2015-11-08 DIAGNOSIS — N2589 Other disorders resulting from impaired renal tubular function: Secondary | ICD-10-CM | POA: Diagnosis not present

## 2015-11-09 DIAGNOSIS — Z79899 Other long term (current) drug therapy: Secondary | ICD-10-CM | POA: Diagnosis not present

## 2015-11-09 DIAGNOSIS — N186 End stage renal disease: Secondary | ICD-10-CM | POA: Diagnosis not present

## 2015-11-09 DIAGNOSIS — E440 Moderate protein-calorie malnutrition: Secondary | ICD-10-CM | POA: Diagnosis not present

## 2015-11-09 DIAGNOSIS — N2589 Other disorders resulting from impaired renal tubular function: Secondary | ICD-10-CM | POA: Diagnosis not present

## 2015-11-09 DIAGNOSIS — Z5189 Encounter for other specified aftercare: Secondary | ICD-10-CM | POA: Diagnosis not present

## 2015-11-09 DIAGNOSIS — D630 Anemia in neoplastic disease: Secondary | ICD-10-CM | POA: Diagnosis not present

## 2015-11-10 DIAGNOSIS — E440 Moderate protein-calorie malnutrition: Secondary | ICD-10-CM | POA: Diagnosis not present

## 2015-11-10 DIAGNOSIS — N186 End stage renal disease: Secondary | ICD-10-CM | POA: Diagnosis not present

## 2015-11-10 DIAGNOSIS — Z5189 Encounter for other specified aftercare: Secondary | ICD-10-CM | POA: Diagnosis not present

## 2015-11-10 DIAGNOSIS — N2589 Other disorders resulting from impaired renal tubular function: Secondary | ICD-10-CM | POA: Diagnosis not present

## 2015-11-10 DIAGNOSIS — D630 Anemia in neoplastic disease: Secondary | ICD-10-CM | POA: Diagnosis not present

## 2015-11-10 DIAGNOSIS — Z79899 Other long term (current) drug therapy: Secondary | ICD-10-CM | POA: Diagnosis not present

## 2015-11-11 DIAGNOSIS — Z79899 Other long term (current) drug therapy: Secondary | ICD-10-CM | POA: Diagnosis not present

## 2015-11-11 DIAGNOSIS — D630 Anemia in neoplastic disease: Secondary | ICD-10-CM | POA: Diagnosis not present

## 2015-11-11 DIAGNOSIS — Z5189 Encounter for other specified aftercare: Secondary | ICD-10-CM | POA: Diagnosis not present

## 2015-11-11 DIAGNOSIS — N2589 Other disorders resulting from impaired renal tubular function: Secondary | ICD-10-CM | POA: Diagnosis not present

## 2015-11-11 DIAGNOSIS — N186 End stage renal disease: Secondary | ICD-10-CM | POA: Diagnosis not present

## 2015-11-11 DIAGNOSIS — E440 Moderate protein-calorie malnutrition: Secondary | ICD-10-CM | POA: Diagnosis not present

## 2015-11-12 DIAGNOSIS — N186 End stage renal disease: Secondary | ICD-10-CM | POA: Diagnosis not present

## 2015-11-12 DIAGNOSIS — N2589 Other disorders resulting from impaired renal tubular function: Secondary | ICD-10-CM | POA: Diagnosis not present

## 2015-11-12 DIAGNOSIS — Z79899 Other long term (current) drug therapy: Secondary | ICD-10-CM | POA: Diagnosis not present

## 2015-11-12 DIAGNOSIS — D630 Anemia in neoplastic disease: Secondary | ICD-10-CM | POA: Diagnosis not present

## 2015-11-12 DIAGNOSIS — E440 Moderate protein-calorie malnutrition: Secondary | ICD-10-CM | POA: Diagnosis not present

## 2015-11-12 DIAGNOSIS — Z5189 Encounter for other specified aftercare: Secondary | ICD-10-CM | POA: Diagnosis not present

## 2015-11-13 DIAGNOSIS — N2589 Other disorders resulting from impaired renal tubular function: Secondary | ICD-10-CM | POA: Diagnosis not present

## 2015-11-13 DIAGNOSIS — N186 End stage renal disease: Secondary | ICD-10-CM | POA: Diagnosis not present

## 2015-11-13 DIAGNOSIS — E440 Moderate protein-calorie malnutrition: Secondary | ICD-10-CM | POA: Diagnosis not present

## 2015-11-13 DIAGNOSIS — Z79899 Other long term (current) drug therapy: Secondary | ICD-10-CM | POA: Diagnosis not present

## 2015-11-13 DIAGNOSIS — D630 Anemia in neoplastic disease: Secondary | ICD-10-CM | POA: Diagnosis not present

## 2015-11-13 DIAGNOSIS — Z5189 Encounter for other specified aftercare: Secondary | ICD-10-CM | POA: Diagnosis not present

## 2015-11-14 DIAGNOSIS — E440 Moderate protein-calorie malnutrition: Secondary | ICD-10-CM | POA: Diagnosis not present

## 2015-11-14 DIAGNOSIS — Z5189 Encounter for other specified aftercare: Secondary | ICD-10-CM | POA: Diagnosis not present

## 2015-11-14 DIAGNOSIS — D630 Anemia in neoplastic disease: Secondary | ICD-10-CM | POA: Diagnosis not present

## 2015-11-14 DIAGNOSIS — Z992 Dependence on renal dialysis: Secondary | ICD-10-CM | POA: Diagnosis not present

## 2015-11-14 DIAGNOSIS — Z79899 Other long term (current) drug therapy: Secondary | ICD-10-CM | POA: Diagnosis not present

## 2015-11-14 DIAGNOSIS — N2589 Other disorders resulting from impaired renal tubular function: Secondary | ICD-10-CM | POA: Diagnosis not present

## 2015-11-14 DIAGNOSIS — N186 End stage renal disease: Secondary | ICD-10-CM | POA: Diagnosis not present

## 2015-11-15 DIAGNOSIS — Z23 Encounter for immunization: Secondary | ICD-10-CM | POA: Diagnosis not present

## 2015-11-15 DIAGNOSIS — Z79899 Other long term (current) drug therapy: Secondary | ICD-10-CM | POA: Diagnosis not present

## 2015-11-15 DIAGNOSIS — N186 End stage renal disease: Secondary | ICD-10-CM | POA: Diagnosis not present

## 2015-11-15 DIAGNOSIS — E440 Moderate protein-calorie malnutrition: Secondary | ICD-10-CM | POA: Diagnosis not present

## 2015-11-15 DIAGNOSIS — D630 Anemia in neoplastic disease: Secondary | ICD-10-CM | POA: Diagnosis not present

## 2015-11-15 DIAGNOSIS — Z5189 Encounter for other specified aftercare: Secondary | ICD-10-CM | POA: Diagnosis not present

## 2015-11-15 DIAGNOSIS — D509 Iron deficiency anemia, unspecified: Secondary | ICD-10-CM | POA: Diagnosis not present

## 2015-11-15 DIAGNOSIS — N2589 Other disorders resulting from impaired renal tubular function: Secondary | ICD-10-CM | POA: Diagnosis not present

## 2015-11-16 DIAGNOSIS — N2589 Other disorders resulting from impaired renal tubular function: Secondary | ICD-10-CM | POA: Diagnosis not present

## 2015-11-16 DIAGNOSIS — R8299 Other abnormal findings in urine: Secondary | ICD-10-CM | POA: Diagnosis not present

## 2015-11-16 DIAGNOSIS — N186 End stage renal disease: Secondary | ICD-10-CM | POA: Diagnosis not present

## 2015-11-16 DIAGNOSIS — D509 Iron deficiency anemia, unspecified: Secondary | ICD-10-CM | POA: Diagnosis not present

## 2015-11-16 DIAGNOSIS — D630 Anemia in neoplastic disease: Secondary | ICD-10-CM | POA: Diagnosis not present

## 2015-11-16 DIAGNOSIS — Z79899 Other long term (current) drug therapy: Secondary | ICD-10-CM | POA: Diagnosis not present

## 2015-11-16 DIAGNOSIS — E440 Moderate protein-calorie malnutrition: Secondary | ICD-10-CM | POA: Diagnosis not present

## 2015-11-17 DIAGNOSIS — E440 Moderate protein-calorie malnutrition: Secondary | ICD-10-CM | POA: Diagnosis not present

## 2015-11-17 DIAGNOSIS — D509 Iron deficiency anemia, unspecified: Secondary | ICD-10-CM | POA: Diagnosis not present

## 2015-11-17 DIAGNOSIS — D630 Anemia in neoplastic disease: Secondary | ICD-10-CM | POA: Diagnosis not present

## 2015-11-17 DIAGNOSIS — N2589 Other disorders resulting from impaired renal tubular function: Secondary | ICD-10-CM | POA: Diagnosis not present

## 2015-11-17 DIAGNOSIS — Z79899 Other long term (current) drug therapy: Secondary | ICD-10-CM | POA: Diagnosis not present

## 2015-11-17 DIAGNOSIS — N186 End stage renal disease: Secondary | ICD-10-CM | POA: Diagnosis not present

## 2015-11-18 DIAGNOSIS — N186 End stage renal disease: Secondary | ICD-10-CM | POA: Diagnosis not present

## 2015-11-18 DIAGNOSIS — Z79899 Other long term (current) drug therapy: Secondary | ICD-10-CM | POA: Diagnosis not present

## 2015-11-18 DIAGNOSIS — N2589 Other disorders resulting from impaired renal tubular function: Secondary | ICD-10-CM | POA: Diagnosis not present

## 2015-11-18 DIAGNOSIS — E440 Moderate protein-calorie malnutrition: Secondary | ICD-10-CM | POA: Diagnosis not present

## 2015-11-18 DIAGNOSIS — D509 Iron deficiency anemia, unspecified: Secondary | ICD-10-CM | POA: Diagnosis not present

## 2015-11-18 DIAGNOSIS — D630 Anemia in neoplastic disease: Secondary | ICD-10-CM | POA: Diagnosis not present

## 2015-11-19 DIAGNOSIS — N2589 Other disorders resulting from impaired renal tubular function: Secondary | ICD-10-CM | POA: Diagnosis not present

## 2015-11-19 DIAGNOSIS — D509 Iron deficiency anemia, unspecified: Secondary | ICD-10-CM | POA: Diagnosis not present

## 2015-11-19 DIAGNOSIS — E440 Moderate protein-calorie malnutrition: Secondary | ICD-10-CM | POA: Diagnosis not present

## 2015-11-19 DIAGNOSIS — N186 End stage renal disease: Secondary | ICD-10-CM | POA: Diagnosis not present

## 2015-11-19 DIAGNOSIS — D630 Anemia in neoplastic disease: Secondary | ICD-10-CM | POA: Diagnosis not present

## 2015-11-19 DIAGNOSIS — Z79899 Other long term (current) drug therapy: Secondary | ICD-10-CM | POA: Diagnosis not present

## 2015-11-20 DIAGNOSIS — N2589 Other disorders resulting from impaired renal tubular function: Secondary | ICD-10-CM | POA: Diagnosis not present

## 2015-11-20 DIAGNOSIS — D630 Anemia in neoplastic disease: Secondary | ICD-10-CM | POA: Diagnosis not present

## 2015-11-20 DIAGNOSIS — E440 Moderate protein-calorie malnutrition: Secondary | ICD-10-CM | POA: Diagnosis not present

## 2015-11-20 DIAGNOSIS — D509 Iron deficiency anemia, unspecified: Secondary | ICD-10-CM | POA: Diagnosis not present

## 2015-11-20 DIAGNOSIS — N186 End stage renal disease: Secondary | ICD-10-CM | POA: Diagnosis not present

## 2015-11-20 DIAGNOSIS — Z79899 Other long term (current) drug therapy: Secondary | ICD-10-CM | POA: Diagnosis not present

## 2015-11-21 DIAGNOSIS — N186 End stage renal disease: Secondary | ICD-10-CM | POA: Diagnosis not present

## 2015-11-21 DIAGNOSIS — N2589 Other disorders resulting from impaired renal tubular function: Secondary | ICD-10-CM | POA: Diagnosis not present

## 2015-11-21 DIAGNOSIS — E440 Moderate protein-calorie malnutrition: Secondary | ICD-10-CM | POA: Diagnosis not present

## 2015-11-21 DIAGNOSIS — D630 Anemia in neoplastic disease: Secondary | ICD-10-CM | POA: Diagnosis not present

## 2015-11-21 DIAGNOSIS — Z79899 Other long term (current) drug therapy: Secondary | ICD-10-CM | POA: Diagnosis not present

## 2015-11-21 DIAGNOSIS — D509 Iron deficiency anemia, unspecified: Secondary | ICD-10-CM | POA: Diagnosis not present

## 2015-11-22 DIAGNOSIS — D509 Iron deficiency anemia, unspecified: Secondary | ICD-10-CM | POA: Diagnosis not present

## 2015-11-22 DIAGNOSIS — N2589 Other disorders resulting from impaired renal tubular function: Secondary | ICD-10-CM | POA: Diagnosis not present

## 2015-11-22 DIAGNOSIS — Z79899 Other long term (current) drug therapy: Secondary | ICD-10-CM | POA: Diagnosis not present

## 2015-11-22 DIAGNOSIS — N186 End stage renal disease: Secondary | ICD-10-CM | POA: Diagnosis not present

## 2015-11-22 DIAGNOSIS — E440 Moderate protein-calorie malnutrition: Secondary | ICD-10-CM | POA: Diagnosis not present

## 2015-11-22 DIAGNOSIS — D630 Anemia in neoplastic disease: Secondary | ICD-10-CM | POA: Diagnosis not present

## 2015-11-23 DIAGNOSIS — D630 Anemia in neoplastic disease: Secondary | ICD-10-CM | POA: Diagnosis not present

## 2015-11-23 DIAGNOSIS — N186 End stage renal disease: Secondary | ICD-10-CM | POA: Diagnosis not present

## 2015-11-23 DIAGNOSIS — D509 Iron deficiency anemia, unspecified: Secondary | ICD-10-CM | POA: Diagnosis not present

## 2015-11-23 DIAGNOSIS — N2589 Other disorders resulting from impaired renal tubular function: Secondary | ICD-10-CM | POA: Diagnosis not present

## 2015-11-23 DIAGNOSIS — E440 Moderate protein-calorie malnutrition: Secondary | ICD-10-CM | POA: Diagnosis not present

## 2015-11-23 DIAGNOSIS — Z79899 Other long term (current) drug therapy: Secondary | ICD-10-CM | POA: Diagnosis not present

## 2015-11-24 DIAGNOSIS — Z79899 Other long term (current) drug therapy: Secondary | ICD-10-CM | POA: Diagnosis not present

## 2015-11-24 DIAGNOSIS — E440 Moderate protein-calorie malnutrition: Secondary | ICD-10-CM | POA: Diagnosis not present

## 2015-11-24 DIAGNOSIS — D630 Anemia in neoplastic disease: Secondary | ICD-10-CM | POA: Diagnosis not present

## 2015-11-24 DIAGNOSIS — D509 Iron deficiency anemia, unspecified: Secondary | ICD-10-CM | POA: Diagnosis not present

## 2015-11-24 DIAGNOSIS — N2589 Other disorders resulting from impaired renal tubular function: Secondary | ICD-10-CM | POA: Diagnosis not present

## 2015-11-24 DIAGNOSIS — N186 End stage renal disease: Secondary | ICD-10-CM | POA: Diagnosis not present

## 2015-11-25 DIAGNOSIS — N186 End stage renal disease: Secondary | ICD-10-CM | POA: Diagnosis not present

## 2015-11-25 DIAGNOSIS — N2589 Other disorders resulting from impaired renal tubular function: Secondary | ICD-10-CM | POA: Diagnosis not present

## 2015-11-25 DIAGNOSIS — D630 Anemia in neoplastic disease: Secondary | ICD-10-CM | POA: Diagnosis not present

## 2015-11-25 DIAGNOSIS — D509 Iron deficiency anemia, unspecified: Secondary | ICD-10-CM | POA: Diagnosis not present

## 2015-11-25 DIAGNOSIS — Z79899 Other long term (current) drug therapy: Secondary | ICD-10-CM | POA: Diagnosis not present

## 2015-11-25 DIAGNOSIS — E440 Moderate protein-calorie malnutrition: Secondary | ICD-10-CM | POA: Diagnosis not present

## 2015-11-26 DIAGNOSIS — N2589 Other disorders resulting from impaired renal tubular function: Secondary | ICD-10-CM | POA: Diagnosis not present

## 2015-11-26 DIAGNOSIS — N186 End stage renal disease: Secondary | ICD-10-CM | POA: Diagnosis not present

## 2015-11-26 DIAGNOSIS — D630 Anemia in neoplastic disease: Secondary | ICD-10-CM | POA: Diagnosis not present

## 2015-11-26 DIAGNOSIS — E440 Moderate protein-calorie malnutrition: Secondary | ICD-10-CM | POA: Diagnosis not present

## 2015-11-26 DIAGNOSIS — Z79899 Other long term (current) drug therapy: Secondary | ICD-10-CM | POA: Diagnosis not present

## 2015-11-26 DIAGNOSIS — D509 Iron deficiency anemia, unspecified: Secondary | ICD-10-CM | POA: Diagnosis not present

## 2015-11-27 DIAGNOSIS — D509 Iron deficiency anemia, unspecified: Secondary | ICD-10-CM | POA: Diagnosis not present

## 2015-11-27 DIAGNOSIS — N2589 Other disorders resulting from impaired renal tubular function: Secondary | ICD-10-CM | POA: Diagnosis not present

## 2015-11-27 DIAGNOSIS — Z79899 Other long term (current) drug therapy: Secondary | ICD-10-CM | POA: Diagnosis not present

## 2015-11-27 DIAGNOSIS — D630 Anemia in neoplastic disease: Secondary | ICD-10-CM | POA: Diagnosis not present

## 2015-11-27 DIAGNOSIS — E440 Moderate protein-calorie malnutrition: Secondary | ICD-10-CM | POA: Diagnosis not present

## 2015-11-27 DIAGNOSIS — N186 End stage renal disease: Secondary | ICD-10-CM | POA: Diagnosis not present

## 2015-11-28 DIAGNOSIS — D509 Iron deficiency anemia, unspecified: Secondary | ICD-10-CM | POA: Diagnosis not present

## 2015-11-28 DIAGNOSIS — Z79899 Other long term (current) drug therapy: Secondary | ICD-10-CM | POA: Diagnosis not present

## 2015-11-28 DIAGNOSIS — D630 Anemia in neoplastic disease: Secondary | ICD-10-CM | POA: Diagnosis not present

## 2015-11-28 DIAGNOSIS — N186 End stage renal disease: Secondary | ICD-10-CM | POA: Diagnosis not present

## 2015-11-28 DIAGNOSIS — E440 Moderate protein-calorie malnutrition: Secondary | ICD-10-CM | POA: Diagnosis not present

## 2015-11-28 DIAGNOSIS — N2589 Other disorders resulting from impaired renal tubular function: Secondary | ICD-10-CM | POA: Diagnosis not present

## 2015-11-29 DIAGNOSIS — N186 End stage renal disease: Secondary | ICD-10-CM | POA: Diagnosis not present

## 2015-11-29 DIAGNOSIS — D509 Iron deficiency anemia, unspecified: Secondary | ICD-10-CM | POA: Diagnosis not present

## 2015-11-29 DIAGNOSIS — N2589 Other disorders resulting from impaired renal tubular function: Secondary | ICD-10-CM | POA: Diagnosis not present

## 2015-11-29 DIAGNOSIS — D630 Anemia in neoplastic disease: Secondary | ICD-10-CM | POA: Diagnosis not present

## 2015-11-29 DIAGNOSIS — Z79899 Other long term (current) drug therapy: Secondary | ICD-10-CM | POA: Diagnosis not present

## 2015-11-29 DIAGNOSIS — E440 Moderate protein-calorie malnutrition: Secondary | ICD-10-CM | POA: Diagnosis not present

## 2015-11-30 DIAGNOSIS — D630 Anemia in neoplastic disease: Secondary | ICD-10-CM | POA: Diagnosis not present

## 2015-11-30 DIAGNOSIS — N186 End stage renal disease: Secondary | ICD-10-CM | POA: Diagnosis not present

## 2015-11-30 DIAGNOSIS — E440 Moderate protein-calorie malnutrition: Secondary | ICD-10-CM | POA: Diagnosis not present

## 2015-11-30 DIAGNOSIS — Z79899 Other long term (current) drug therapy: Secondary | ICD-10-CM | POA: Diagnosis not present

## 2015-11-30 DIAGNOSIS — D509 Iron deficiency anemia, unspecified: Secondary | ICD-10-CM | POA: Diagnosis not present

## 2015-11-30 DIAGNOSIS — N2589 Other disorders resulting from impaired renal tubular function: Secondary | ICD-10-CM | POA: Diagnosis not present

## 2015-12-01 DIAGNOSIS — D630 Anemia in neoplastic disease: Secondary | ICD-10-CM | POA: Diagnosis not present

## 2015-12-01 DIAGNOSIS — N186 End stage renal disease: Secondary | ICD-10-CM | POA: Diagnosis not present

## 2015-12-01 DIAGNOSIS — D509 Iron deficiency anemia, unspecified: Secondary | ICD-10-CM | POA: Diagnosis not present

## 2015-12-01 DIAGNOSIS — N2589 Other disorders resulting from impaired renal tubular function: Secondary | ICD-10-CM | POA: Diagnosis not present

## 2015-12-01 DIAGNOSIS — Z79899 Other long term (current) drug therapy: Secondary | ICD-10-CM | POA: Diagnosis not present

## 2015-12-01 DIAGNOSIS — E440 Moderate protein-calorie malnutrition: Secondary | ICD-10-CM | POA: Diagnosis not present

## 2015-12-02 DIAGNOSIS — N186 End stage renal disease: Secondary | ICD-10-CM | POA: Diagnosis not present

## 2015-12-02 DIAGNOSIS — D509 Iron deficiency anemia, unspecified: Secondary | ICD-10-CM | POA: Diagnosis not present

## 2015-12-02 DIAGNOSIS — N2589 Other disorders resulting from impaired renal tubular function: Secondary | ICD-10-CM | POA: Diagnosis not present

## 2015-12-02 DIAGNOSIS — D630 Anemia in neoplastic disease: Secondary | ICD-10-CM | POA: Diagnosis not present

## 2015-12-02 DIAGNOSIS — Z79899 Other long term (current) drug therapy: Secondary | ICD-10-CM | POA: Diagnosis not present

## 2015-12-02 DIAGNOSIS — E440 Moderate protein-calorie malnutrition: Secondary | ICD-10-CM | POA: Diagnosis not present

## 2015-12-03 DIAGNOSIS — N186 End stage renal disease: Secondary | ICD-10-CM | POA: Diagnosis not present

## 2015-12-03 DIAGNOSIS — Z79899 Other long term (current) drug therapy: Secondary | ICD-10-CM | POA: Diagnosis not present

## 2015-12-03 DIAGNOSIS — D630 Anemia in neoplastic disease: Secondary | ICD-10-CM | POA: Diagnosis not present

## 2015-12-03 DIAGNOSIS — D509 Iron deficiency anemia, unspecified: Secondary | ICD-10-CM | POA: Diagnosis not present

## 2015-12-03 DIAGNOSIS — E440 Moderate protein-calorie malnutrition: Secondary | ICD-10-CM | POA: Diagnosis not present

## 2015-12-03 DIAGNOSIS — N2589 Other disorders resulting from impaired renal tubular function: Secondary | ICD-10-CM | POA: Diagnosis not present

## 2015-12-04 DIAGNOSIS — D509 Iron deficiency anemia, unspecified: Secondary | ICD-10-CM | POA: Diagnosis not present

## 2015-12-04 DIAGNOSIS — Z79899 Other long term (current) drug therapy: Secondary | ICD-10-CM | POA: Diagnosis not present

## 2015-12-04 DIAGNOSIS — N186 End stage renal disease: Secondary | ICD-10-CM | POA: Diagnosis not present

## 2015-12-04 DIAGNOSIS — N2589 Other disorders resulting from impaired renal tubular function: Secondary | ICD-10-CM | POA: Diagnosis not present

## 2015-12-04 DIAGNOSIS — E440 Moderate protein-calorie malnutrition: Secondary | ICD-10-CM | POA: Diagnosis not present

## 2015-12-04 DIAGNOSIS — D630 Anemia in neoplastic disease: Secondary | ICD-10-CM | POA: Diagnosis not present

## 2015-12-05 DIAGNOSIS — E440 Moderate protein-calorie malnutrition: Secondary | ICD-10-CM | POA: Diagnosis not present

## 2015-12-05 DIAGNOSIS — Z79899 Other long term (current) drug therapy: Secondary | ICD-10-CM | POA: Diagnosis not present

## 2015-12-05 DIAGNOSIS — N2589 Other disorders resulting from impaired renal tubular function: Secondary | ICD-10-CM | POA: Diagnosis not present

## 2015-12-05 DIAGNOSIS — D509 Iron deficiency anemia, unspecified: Secondary | ICD-10-CM | POA: Diagnosis not present

## 2015-12-05 DIAGNOSIS — D630 Anemia in neoplastic disease: Secondary | ICD-10-CM | POA: Diagnosis not present

## 2015-12-05 DIAGNOSIS — N186 End stage renal disease: Secondary | ICD-10-CM | POA: Diagnosis not present

## 2015-12-06 DIAGNOSIS — N186 End stage renal disease: Secondary | ICD-10-CM | POA: Diagnosis not present

## 2015-12-06 DIAGNOSIS — N2589 Other disorders resulting from impaired renal tubular function: Secondary | ICD-10-CM | POA: Diagnosis not present

## 2015-12-06 DIAGNOSIS — E440 Moderate protein-calorie malnutrition: Secondary | ICD-10-CM | POA: Diagnosis not present

## 2015-12-06 DIAGNOSIS — Z79899 Other long term (current) drug therapy: Secondary | ICD-10-CM | POA: Diagnosis not present

## 2015-12-06 DIAGNOSIS — D509 Iron deficiency anemia, unspecified: Secondary | ICD-10-CM | POA: Diagnosis not present

## 2015-12-06 DIAGNOSIS — D630 Anemia in neoplastic disease: Secondary | ICD-10-CM | POA: Diagnosis not present

## 2015-12-07 DIAGNOSIS — D509 Iron deficiency anemia, unspecified: Secondary | ICD-10-CM | POA: Diagnosis not present

## 2015-12-07 DIAGNOSIS — D630 Anemia in neoplastic disease: Secondary | ICD-10-CM | POA: Diagnosis not present

## 2015-12-07 DIAGNOSIS — N186 End stage renal disease: Secondary | ICD-10-CM | POA: Diagnosis not present

## 2015-12-07 DIAGNOSIS — N2589 Other disorders resulting from impaired renal tubular function: Secondary | ICD-10-CM | POA: Diagnosis not present

## 2015-12-07 DIAGNOSIS — E440 Moderate protein-calorie malnutrition: Secondary | ICD-10-CM | POA: Diagnosis not present

## 2015-12-07 DIAGNOSIS — Z79899 Other long term (current) drug therapy: Secondary | ICD-10-CM | POA: Diagnosis not present

## 2015-12-08 DIAGNOSIS — N186 End stage renal disease: Secondary | ICD-10-CM | POA: Diagnosis not present

## 2015-12-08 DIAGNOSIS — N2589 Other disorders resulting from impaired renal tubular function: Secondary | ICD-10-CM | POA: Diagnosis not present

## 2015-12-08 DIAGNOSIS — D509 Iron deficiency anemia, unspecified: Secondary | ICD-10-CM | POA: Diagnosis not present

## 2015-12-08 DIAGNOSIS — E440 Moderate protein-calorie malnutrition: Secondary | ICD-10-CM | POA: Diagnosis not present

## 2015-12-08 DIAGNOSIS — D630 Anemia in neoplastic disease: Secondary | ICD-10-CM | POA: Diagnosis not present

## 2015-12-08 DIAGNOSIS — Z79899 Other long term (current) drug therapy: Secondary | ICD-10-CM | POA: Diagnosis not present

## 2015-12-09 DIAGNOSIS — D630 Anemia in neoplastic disease: Secondary | ICD-10-CM | POA: Diagnosis not present

## 2015-12-09 DIAGNOSIS — N2589 Other disorders resulting from impaired renal tubular function: Secondary | ICD-10-CM | POA: Diagnosis not present

## 2015-12-09 DIAGNOSIS — Z79899 Other long term (current) drug therapy: Secondary | ICD-10-CM | POA: Diagnosis not present

## 2015-12-09 DIAGNOSIS — N186 End stage renal disease: Secondary | ICD-10-CM | POA: Diagnosis not present

## 2015-12-09 DIAGNOSIS — D509 Iron deficiency anemia, unspecified: Secondary | ICD-10-CM | POA: Diagnosis not present

## 2015-12-09 DIAGNOSIS — E440 Moderate protein-calorie malnutrition: Secondary | ICD-10-CM | POA: Diagnosis not present

## 2015-12-10 DIAGNOSIS — D509 Iron deficiency anemia, unspecified: Secondary | ICD-10-CM | POA: Diagnosis not present

## 2015-12-10 DIAGNOSIS — E440 Moderate protein-calorie malnutrition: Secondary | ICD-10-CM | POA: Diagnosis not present

## 2015-12-10 DIAGNOSIS — D630 Anemia in neoplastic disease: Secondary | ICD-10-CM | POA: Diagnosis not present

## 2015-12-10 DIAGNOSIS — N186 End stage renal disease: Secondary | ICD-10-CM | POA: Diagnosis not present

## 2015-12-10 DIAGNOSIS — Z79899 Other long term (current) drug therapy: Secondary | ICD-10-CM | POA: Diagnosis not present

## 2015-12-10 DIAGNOSIS — N2589 Other disorders resulting from impaired renal tubular function: Secondary | ICD-10-CM | POA: Diagnosis not present

## 2015-12-11 DIAGNOSIS — E440 Moderate protein-calorie malnutrition: Secondary | ICD-10-CM | POA: Diagnosis not present

## 2015-12-11 DIAGNOSIS — Z79899 Other long term (current) drug therapy: Secondary | ICD-10-CM | POA: Diagnosis not present

## 2015-12-11 DIAGNOSIS — D630 Anemia in neoplastic disease: Secondary | ICD-10-CM | POA: Diagnosis not present

## 2015-12-11 DIAGNOSIS — D509 Iron deficiency anemia, unspecified: Secondary | ICD-10-CM | POA: Diagnosis not present

## 2015-12-11 DIAGNOSIS — N2589 Other disorders resulting from impaired renal tubular function: Secondary | ICD-10-CM | POA: Diagnosis not present

## 2015-12-11 DIAGNOSIS — N186 End stage renal disease: Secondary | ICD-10-CM | POA: Diagnosis not present

## 2015-12-12 DIAGNOSIS — Z79899 Other long term (current) drug therapy: Secondary | ICD-10-CM | POA: Diagnosis not present

## 2015-12-12 DIAGNOSIS — D630 Anemia in neoplastic disease: Secondary | ICD-10-CM | POA: Diagnosis not present

## 2015-12-12 DIAGNOSIS — D509 Iron deficiency anemia, unspecified: Secondary | ICD-10-CM | POA: Diagnosis not present

## 2015-12-12 DIAGNOSIS — E440 Moderate protein-calorie malnutrition: Secondary | ICD-10-CM | POA: Diagnosis not present

## 2015-12-12 DIAGNOSIS — N2589 Other disorders resulting from impaired renal tubular function: Secondary | ICD-10-CM | POA: Diagnosis not present

## 2015-12-12 DIAGNOSIS — N186 End stage renal disease: Secondary | ICD-10-CM | POA: Diagnosis not present

## 2015-12-13 DIAGNOSIS — N186 End stage renal disease: Secondary | ICD-10-CM | POA: Diagnosis not present

## 2015-12-13 DIAGNOSIS — Z79899 Other long term (current) drug therapy: Secondary | ICD-10-CM | POA: Diagnosis not present

## 2015-12-13 DIAGNOSIS — D509 Iron deficiency anemia, unspecified: Secondary | ICD-10-CM | POA: Diagnosis not present

## 2015-12-13 DIAGNOSIS — E440 Moderate protein-calorie malnutrition: Secondary | ICD-10-CM | POA: Diagnosis not present

## 2015-12-13 DIAGNOSIS — N2589 Other disorders resulting from impaired renal tubular function: Secondary | ICD-10-CM | POA: Diagnosis not present

## 2015-12-13 DIAGNOSIS — D630 Anemia in neoplastic disease: Secondary | ICD-10-CM | POA: Diagnosis not present

## 2015-12-14 DIAGNOSIS — D509 Iron deficiency anemia, unspecified: Secondary | ICD-10-CM | POA: Diagnosis not present

## 2015-12-14 DIAGNOSIS — D630 Anemia in neoplastic disease: Secondary | ICD-10-CM | POA: Diagnosis not present

## 2015-12-14 DIAGNOSIS — E440 Moderate protein-calorie malnutrition: Secondary | ICD-10-CM | POA: Diagnosis not present

## 2015-12-14 DIAGNOSIS — N186 End stage renal disease: Secondary | ICD-10-CM | POA: Diagnosis not present

## 2015-12-14 DIAGNOSIS — Z992 Dependence on renal dialysis: Secondary | ICD-10-CM | POA: Diagnosis not present

## 2015-12-14 DIAGNOSIS — N2589 Other disorders resulting from impaired renal tubular function: Secondary | ICD-10-CM | POA: Diagnosis not present

## 2015-12-14 DIAGNOSIS — Z79899 Other long term (current) drug therapy: Secondary | ICD-10-CM | POA: Diagnosis not present

## 2015-12-15 DIAGNOSIS — D630 Anemia in neoplastic disease: Secondary | ICD-10-CM | POA: Diagnosis not present

## 2015-12-15 DIAGNOSIS — D509 Iron deficiency anemia, unspecified: Secondary | ICD-10-CM | POA: Diagnosis not present

## 2015-12-15 DIAGNOSIS — N2581 Secondary hyperparathyroidism of renal origin: Secondary | ICD-10-CM | POA: Diagnosis not present

## 2015-12-15 DIAGNOSIS — N2589 Other disorders resulting from impaired renal tubular function: Secondary | ICD-10-CM | POA: Diagnosis not present

## 2015-12-15 DIAGNOSIS — N186 End stage renal disease: Secondary | ICD-10-CM | POA: Diagnosis not present

## 2015-12-16 DIAGNOSIS — N186 End stage renal disease: Secondary | ICD-10-CM | POA: Diagnosis not present

## 2015-12-16 DIAGNOSIS — N2589 Other disorders resulting from impaired renal tubular function: Secondary | ICD-10-CM | POA: Diagnosis not present

## 2015-12-16 DIAGNOSIS — N2581 Secondary hyperparathyroidism of renal origin: Secondary | ICD-10-CM | POA: Diagnosis not present

## 2015-12-16 DIAGNOSIS — D509 Iron deficiency anemia, unspecified: Secondary | ICD-10-CM | POA: Diagnosis not present

## 2015-12-16 DIAGNOSIS — D630 Anemia in neoplastic disease: Secondary | ICD-10-CM | POA: Diagnosis not present

## 2015-12-17 DIAGNOSIS — N2589 Other disorders resulting from impaired renal tubular function: Secondary | ICD-10-CM | POA: Diagnosis not present

## 2015-12-17 DIAGNOSIS — D630 Anemia in neoplastic disease: Secondary | ICD-10-CM | POA: Diagnosis not present

## 2015-12-17 DIAGNOSIS — D509 Iron deficiency anemia, unspecified: Secondary | ICD-10-CM | POA: Diagnosis not present

## 2015-12-17 DIAGNOSIS — N2581 Secondary hyperparathyroidism of renal origin: Secondary | ICD-10-CM | POA: Diagnosis not present

## 2015-12-17 DIAGNOSIS — N186 End stage renal disease: Secondary | ICD-10-CM | POA: Diagnosis not present

## 2015-12-18 ENCOUNTER — Other Ambulatory Visit (HOSPITAL_COMMUNITY): Payer: Self-pay | Admitting: Transplant

## 2015-12-18 DIAGNOSIS — D630 Anemia in neoplastic disease: Secondary | ICD-10-CM | POA: Diagnosis not present

## 2015-12-18 DIAGNOSIS — N186 End stage renal disease: Secondary | ICD-10-CM | POA: Diagnosis not present

## 2015-12-18 DIAGNOSIS — D509 Iron deficiency anemia, unspecified: Secondary | ICD-10-CM | POA: Diagnosis not present

## 2015-12-18 DIAGNOSIS — N2589 Other disorders resulting from impaired renal tubular function: Secondary | ICD-10-CM | POA: Diagnosis not present

## 2015-12-18 DIAGNOSIS — Z01818 Encounter for other preprocedural examination: Secondary | ICD-10-CM

## 2015-12-18 DIAGNOSIS — N2581 Secondary hyperparathyroidism of renal origin: Secondary | ICD-10-CM | POA: Diagnosis not present

## 2015-12-19 DIAGNOSIS — D630 Anemia in neoplastic disease: Secondary | ICD-10-CM | POA: Diagnosis not present

## 2015-12-19 DIAGNOSIS — N186 End stage renal disease: Secondary | ICD-10-CM | POA: Diagnosis not present

## 2015-12-19 DIAGNOSIS — D509 Iron deficiency anemia, unspecified: Secondary | ICD-10-CM | POA: Diagnosis not present

## 2015-12-19 DIAGNOSIS — N2589 Other disorders resulting from impaired renal tubular function: Secondary | ICD-10-CM | POA: Diagnosis not present

## 2015-12-19 DIAGNOSIS — N2581 Secondary hyperparathyroidism of renal origin: Secondary | ICD-10-CM | POA: Diagnosis not present

## 2015-12-20 DIAGNOSIS — N186 End stage renal disease: Secondary | ICD-10-CM | POA: Diagnosis not present

## 2015-12-20 DIAGNOSIS — D630 Anemia in neoplastic disease: Secondary | ICD-10-CM | POA: Diagnosis not present

## 2015-12-20 DIAGNOSIS — N2589 Other disorders resulting from impaired renal tubular function: Secondary | ICD-10-CM | POA: Diagnosis not present

## 2015-12-20 DIAGNOSIS — N2581 Secondary hyperparathyroidism of renal origin: Secondary | ICD-10-CM | POA: Diagnosis not present

## 2015-12-20 DIAGNOSIS — D509 Iron deficiency anemia, unspecified: Secondary | ICD-10-CM | POA: Diagnosis not present

## 2015-12-21 DIAGNOSIS — D509 Iron deficiency anemia, unspecified: Secondary | ICD-10-CM | POA: Diagnosis not present

## 2015-12-21 DIAGNOSIS — N186 End stage renal disease: Secondary | ICD-10-CM | POA: Diagnosis not present

## 2015-12-21 DIAGNOSIS — N2581 Secondary hyperparathyroidism of renal origin: Secondary | ICD-10-CM | POA: Diagnosis not present

## 2015-12-21 DIAGNOSIS — N2589 Other disorders resulting from impaired renal tubular function: Secondary | ICD-10-CM | POA: Diagnosis not present

## 2015-12-21 DIAGNOSIS — D630 Anemia in neoplastic disease: Secondary | ICD-10-CM | POA: Diagnosis not present

## 2015-12-22 DIAGNOSIS — N2581 Secondary hyperparathyroidism of renal origin: Secondary | ICD-10-CM | POA: Diagnosis not present

## 2015-12-22 DIAGNOSIS — N2589 Other disorders resulting from impaired renal tubular function: Secondary | ICD-10-CM | POA: Diagnosis not present

## 2015-12-22 DIAGNOSIS — D630 Anemia in neoplastic disease: Secondary | ICD-10-CM | POA: Diagnosis not present

## 2015-12-22 DIAGNOSIS — D509 Iron deficiency anemia, unspecified: Secondary | ICD-10-CM | POA: Diagnosis not present

## 2015-12-22 DIAGNOSIS — N186 End stage renal disease: Secondary | ICD-10-CM | POA: Diagnosis not present

## 2015-12-23 DIAGNOSIS — D509 Iron deficiency anemia, unspecified: Secondary | ICD-10-CM | POA: Diagnosis not present

## 2015-12-23 DIAGNOSIS — N186 End stage renal disease: Secondary | ICD-10-CM | POA: Diagnosis not present

## 2015-12-23 DIAGNOSIS — D630 Anemia in neoplastic disease: Secondary | ICD-10-CM | POA: Diagnosis not present

## 2015-12-23 DIAGNOSIS — N2581 Secondary hyperparathyroidism of renal origin: Secondary | ICD-10-CM | POA: Diagnosis not present

## 2015-12-23 DIAGNOSIS — N2589 Other disorders resulting from impaired renal tubular function: Secondary | ICD-10-CM | POA: Diagnosis not present

## 2015-12-24 DIAGNOSIS — N2589 Other disorders resulting from impaired renal tubular function: Secondary | ICD-10-CM | POA: Diagnosis not present

## 2015-12-24 DIAGNOSIS — D630 Anemia in neoplastic disease: Secondary | ICD-10-CM | POA: Diagnosis not present

## 2015-12-24 DIAGNOSIS — D509 Iron deficiency anemia, unspecified: Secondary | ICD-10-CM | POA: Diagnosis not present

## 2015-12-24 DIAGNOSIS — N186 End stage renal disease: Secondary | ICD-10-CM | POA: Diagnosis not present

## 2015-12-24 DIAGNOSIS — N2581 Secondary hyperparathyroidism of renal origin: Secondary | ICD-10-CM | POA: Diagnosis not present

## 2015-12-25 DIAGNOSIS — D630 Anemia in neoplastic disease: Secondary | ICD-10-CM | POA: Diagnosis not present

## 2015-12-25 DIAGNOSIS — N2589 Other disorders resulting from impaired renal tubular function: Secondary | ICD-10-CM | POA: Diagnosis not present

## 2015-12-25 DIAGNOSIS — N2581 Secondary hyperparathyroidism of renal origin: Secondary | ICD-10-CM | POA: Diagnosis not present

## 2015-12-25 DIAGNOSIS — E784 Other hyperlipidemia: Secondary | ICD-10-CM | POA: Diagnosis not present

## 2015-12-25 DIAGNOSIS — N186 End stage renal disease: Secondary | ICD-10-CM | POA: Diagnosis not present

## 2015-12-25 DIAGNOSIS — D509 Iron deficiency anemia, unspecified: Secondary | ICD-10-CM | POA: Diagnosis not present

## 2015-12-25 DIAGNOSIS — R8299 Other abnormal findings in urine: Secondary | ICD-10-CM | POA: Diagnosis not present

## 2015-12-26 DIAGNOSIS — N186 End stage renal disease: Secondary | ICD-10-CM | POA: Diagnosis not present

## 2015-12-26 DIAGNOSIS — N2589 Other disorders resulting from impaired renal tubular function: Secondary | ICD-10-CM | POA: Diagnosis not present

## 2015-12-26 DIAGNOSIS — D630 Anemia in neoplastic disease: Secondary | ICD-10-CM | POA: Diagnosis not present

## 2015-12-26 DIAGNOSIS — N2581 Secondary hyperparathyroidism of renal origin: Secondary | ICD-10-CM | POA: Diagnosis not present

## 2015-12-26 DIAGNOSIS — D509 Iron deficiency anemia, unspecified: Secondary | ICD-10-CM | POA: Diagnosis not present

## 2015-12-27 DIAGNOSIS — D630 Anemia in neoplastic disease: Secondary | ICD-10-CM | POA: Diagnosis not present

## 2015-12-27 DIAGNOSIS — N186 End stage renal disease: Secondary | ICD-10-CM | POA: Diagnosis not present

## 2015-12-27 DIAGNOSIS — N2581 Secondary hyperparathyroidism of renal origin: Secondary | ICD-10-CM | POA: Diagnosis not present

## 2015-12-27 DIAGNOSIS — N2589 Other disorders resulting from impaired renal tubular function: Secondary | ICD-10-CM | POA: Diagnosis not present

## 2015-12-27 DIAGNOSIS — D509 Iron deficiency anemia, unspecified: Secondary | ICD-10-CM | POA: Diagnosis not present

## 2015-12-28 DIAGNOSIS — N2589 Other disorders resulting from impaired renal tubular function: Secondary | ICD-10-CM | POA: Diagnosis not present

## 2015-12-28 DIAGNOSIS — N186 End stage renal disease: Secondary | ICD-10-CM | POA: Diagnosis not present

## 2015-12-28 DIAGNOSIS — D509 Iron deficiency anemia, unspecified: Secondary | ICD-10-CM | POA: Diagnosis not present

## 2015-12-28 DIAGNOSIS — D630 Anemia in neoplastic disease: Secondary | ICD-10-CM | POA: Diagnosis not present

## 2015-12-28 DIAGNOSIS — N2581 Secondary hyperparathyroidism of renal origin: Secondary | ICD-10-CM | POA: Diagnosis not present

## 2015-12-29 DIAGNOSIS — D630 Anemia in neoplastic disease: Secondary | ICD-10-CM | POA: Diagnosis not present

## 2015-12-29 DIAGNOSIS — N2581 Secondary hyperparathyroidism of renal origin: Secondary | ICD-10-CM | POA: Diagnosis not present

## 2015-12-29 DIAGNOSIS — N186 End stage renal disease: Secondary | ICD-10-CM | POA: Diagnosis not present

## 2015-12-29 DIAGNOSIS — D509 Iron deficiency anemia, unspecified: Secondary | ICD-10-CM | POA: Diagnosis not present

## 2015-12-29 DIAGNOSIS — N2589 Other disorders resulting from impaired renal tubular function: Secondary | ICD-10-CM | POA: Diagnosis not present

## 2015-12-30 DIAGNOSIS — D630 Anemia in neoplastic disease: Secondary | ICD-10-CM | POA: Diagnosis not present

## 2015-12-30 DIAGNOSIS — D509 Iron deficiency anemia, unspecified: Secondary | ICD-10-CM | POA: Diagnosis not present

## 2015-12-30 DIAGNOSIS — N2589 Other disorders resulting from impaired renal tubular function: Secondary | ICD-10-CM | POA: Diagnosis not present

## 2015-12-30 DIAGNOSIS — N186 End stage renal disease: Secondary | ICD-10-CM | POA: Diagnosis not present

## 2015-12-30 DIAGNOSIS — N2581 Secondary hyperparathyroidism of renal origin: Secondary | ICD-10-CM | POA: Diagnosis not present

## 2015-12-31 DIAGNOSIS — D630 Anemia in neoplastic disease: Secondary | ICD-10-CM | POA: Diagnosis not present

## 2015-12-31 DIAGNOSIS — N2581 Secondary hyperparathyroidism of renal origin: Secondary | ICD-10-CM | POA: Diagnosis not present

## 2015-12-31 DIAGNOSIS — N186 End stage renal disease: Secondary | ICD-10-CM | POA: Diagnosis not present

## 2015-12-31 DIAGNOSIS — N2589 Other disorders resulting from impaired renal tubular function: Secondary | ICD-10-CM | POA: Diagnosis not present

## 2015-12-31 DIAGNOSIS — D509 Iron deficiency anemia, unspecified: Secondary | ICD-10-CM | POA: Diagnosis not present

## 2016-01-01 DIAGNOSIS — N2589 Other disorders resulting from impaired renal tubular function: Secondary | ICD-10-CM | POA: Diagnosis not present

## 2016-01-01 DIAGNOSIS — N186 End stage renal disease: Secondary | ICD-10-CM | POA: Diagnosis not present

## 2016-01-01 DIAGNOSIS — D509 Iron deficiency anemia, unspecified: Secondary | ICD-10-CM | POA: Diagnosis not present

## 2016-01-01 DIAGNOSIS — N2581 Secondary hyperparathyroidism of renal origin: Secondary | ICD-10-CM | POA: Diagnosis not present

## 2016-01-01 DIAGNOSIS — D630 Anemia in neoplastic disease: Secondary | ICD-10-CM | POA: Diagnosis not present

## 2016-01-02 ENCOUNTER — Ambulatory Visit (HOSPITAL_COMMUNITY)
Admission: RE | Admit: 2016-01-02 | Discharge: 2016-01-02 | Disposition: A | Discharge status: Home / Self Care | Payer: Medicare Other | Source: Ambulatory Visit | Attending: Transplant | Admitting: Transplant

## 2016-01-02 ENCOUNTER — Ambulatory Visit (HOSPITAL_COMMUNITY): Admission: RE | Admit: 2016-01-02 | Payer: Medicare Other | Source: Ambulatory Visit

## 2016-01-02 DIAGNOSIS — D630 Anemia in neoplastic disease: Secondary | ICD-10-CM | POA: Diagnosis not present

## 2016-01-02 DIAGNOSIS — R079 Chest pain, unspecified: Secondary | ICD-10-CM | POA: Insufficient documentation

## 2016-01-02 DIAGNOSIS — N2581 Secondary hyperparathyroidism of renal origin: Secondary | ICD-10-CM | POA: Diagnosis not present

## 2016-01-02 DIAGNOSIS — N2589 Other disorders resulting from impaired renal tubular function: Secondary | ICD-10-CM | POA: Diagnosis not present

## 2016-01-02 DIAGNOSIS — Z01818 Encounter for other preprocedural examination: Secondary | ICD-10-CM | POA: Diagnosis not present

## 2016-01-02 DIAGNOSIS — D509 Iron deficiency anemia, unspecified: Secondary | ICD-10-CM | POA: Diagnosis not present

## 2016-01-02 DIAGNOSIS — N186 End stage renal disease: Secondary | ICD-10-CM | POA: Diagnosis not present

## 2016-01-02 NOTE — Progress Notes (Signed)
  Echocardiogram 2D Echocardiogram has been performed.  Meghan Richardson 01/02/2016, 5:05 PM

## 2016-01-03 DIAGNOSIS — D509 Iron deficiency anemia, unspecified: Secondary | ICD-10-CM | POA: Diagnosis not present

## 2016-01-03 DIAGNOSIS — N2581 Secondary hyperparathyroidism of renal origin: Secondary | ICD-10-CM | POA: Diagnosis not present

## 2016-01-03 DIAGNOSIS — D630 Anemia in neoplastic disease: Secondary | ICD-10-CM | POA: Diagnosis not present

## 2016-01-03 DIAGNOSIS — N186 End stage renal disease: Secondary | ICD-10-CM | POA: Diagnosis not present

## 2016-01-03 DIAGNOSIS — N2589 Other disorders resulting from impaired renal tubular function: Secondary | ICD-10-CM | POA: Diagnosis not present

## 2016-01-04 DIAGNOSIS — N2581 Secondary hyperparathyroidism of renal origin: Secondary | ICD-10-CM | POA: Diagnosis not present

## 2016-01-04 DIAGNOSIS — D509 Iron deficiency anemia, unspecified: Secondary | ICD-10-CM | POA: Diagnosis not present

## 2016-01-04 DIAGNOSIS — D630 Anemia in neoplastic disease: Secondary | ICD-10-CM | POA: Diagnosis not present

## 2016-01-04 DIAGNOSIS — N2589 Other disorders resulting from impaired renal tubular function: Secondary | ICD-10-CM | POA: Diagnosis not present

## 2016-01-04 DIAGNOSIS — N186 End stage renal disease: Secondary | ICD-10-CM | POA: Diagnosis not present

## 2016-01-05 DIAGNOSIS — N186 End stage renal disease: Secondary | ICD-10-CM | POA: Diagnosis not present

## 2016-01-05 DIAGNOSIS — N2581 Secondary hyperparathyroidism of renal origin: Secondary | ICD-10-CM | POA: Diagnosis not present

## 2016-01-05 DIAGNOSIS — D509 Iron deficiency anemia, unspecified: Secondary | ICD-10-CM | POA: Diagnosis not present

## 2016-01-05 DIAGNOSIS — D630 Anemia in neoplastic disease: Secondary | ICD-10-CM | POA: Diagnosis not present

## 2016-01-05 DIAGNOSIS — N2589 Other disorders resulting from impaired renal tubular function: Secondary | ICD-10-CM | POA: Diagnosis not present

## 2016-01-06 DIAGNOSIS — D630 Anemia in neoplastic disease: Secondary | ICD-10-CM | POA: Diagnosis not present

## 2016-01-06 DIAGNOSIS — N2589 Other disorders resulting from impaired renal tubular function: Secondary | ICD-10-CM | POA: Diagnosis not present

## 2016-01-06 DIAGNOSIS — N186 End stage renal disease: Secondary | ICD-10-CM | POA: Diagnosis not present

## 2016-01-06 DIAGNOSIS — D509 Iron deficiency anemia, unspecified: Secondary | ICD-10-CM | POA: Diagnosis not present

## 2016-01-06 DIAGNOSIS — N2581 Secondary hyperparathyroidism of renal origin: Secondary | ICD-10-CM | POA: Diagnosis not present

## 2016-01-07 DIAGNOSIS — N186 End stage renal disease: Secondary | ICD-10-CM | POA: Diagnosis not present

## 2016-01-07 DIAGNOSIS — D630 Anemia in neoplastic disease: Secondary | ICD-10-CM | POA: Diagnosis not present

## 2016-01-07 DIAGNOSIS — N2581 Secondary hyperparathyroidism of renal origin: Secondary | ICD-10-CM | POA: Diagnosis not present

## 2016-01-07 DIAGNOSIS — N2589 Other disorders resulting from impaired renal tubular function: Secondary | ICD-10-CM | POA: Diagnosis not present

## 2016-01-07 DIAGNOSIS — D509 Iron deficiency anemia, unspecified: Secondary | ICD-10-CM | POA: Diagnosis not present

## 2016-01-08 DIAGNOSIS — N2581 Secondary hyperparathyroidism of renal origin: Secondary | ICD-10-CM | POA: Diagnosis not present

## 2016-01-08 DIAGNOSIS — D630 Anemia in neoplastic disease: Secondary | ICD-10-CM | POA: Diagnosis not present

## 2016-01-08 DIAGNOSIS — N186 End stage renal disease: Secondary | ICD-10-CM | POA: Diagnosis not present

## 2016-01-08 DIAGNOSIS — N2589 Other disorders resulting from impaired renal tubular function: Secondary | ICD-10-CM | POA: Diagnosis not present

## 2016-01-08 DIAGNOSIS — D509 Iron deficiency anemia, unspecified: Secondary | ICD-10-CM | POA: Diagnosis not present

## 2016-01-09 DIAGNOSIS — N186 End stage renal disease: Secondary | ICD-10-CM | POA: Diagnosis not present

## 2016-01-09 DIAGNOSIS — D509 Iron deficiency anemia, unspecified: Secondary | ICD-10-CM | POA: Diagnosis not present

## 2016-01-09 DIAGNOSIS — D630 Anemia in neoplastic disease: Secondary | ICD-10-CM | POA: Diagnosis not present

## 2016-01-09 DIAGNOSIS — N2581 Secondary hyperparathyroidism of renal origin: Secondary | ICD-10-CM | POA: Diagnosis not present

## 2016-01-09 DIAGNOSIS — N2589 Other disorders resulting from impaired renal tubular function: Secondary | ICD-10-CM | POA: Diagnosis not present

## 2016-01-10 DIAGNOSIS — N186 End stage renal disease: Secondary | ICD-10-CM | POA: Diagnosis not present

## 2016-01-10 DIAGNOSIS — N2581 Secondary hyperparathyroidism of renal origin: Secondary | ICD-10-CM | POA: Diagnosis not present

## 2016-01-10 DIAGNOSIS — D630 Anemia in neoplastic disease: Secondary | ICD-10-CM | POA: Diagnosis not present

## 2016-01-10 DIAGNOSIS — D509 Iron deficiency anemia, unspecified: Secondary | ICD-10-CM | POA: Diagnosis not present

## 2016-01-10 DIAGNOSIS — N2589 Other disorders resulting from impaired renal tubular function: Secondary | ICD-10-CM | POA: Diagnosis not present

## 2016-01-11 DIAGNOSIS — N2581 Secondary hyperparathyroidism of renal origin: Secondary | ICD-10-CM | POA: Diagnosis not present

## 2016-01-11 DIAGNOSIS — D509 Iron deficiency anemia, unspecified: Secondary | ICD-10-CM | POA: Diagnosis not present

## 2016-01-11 DIAGNOSIS — N186 End stage renal disease: Secondary | ICD-10-CM | POA: Diagnosis not present

## 2016-01-11 DIAGNOSIS — D630 Anemia in neoplastic disease: Secondary | ICD-10-CM | POA: Diagnosis not present

## 2016-01-11 DIAGNOSIS — N2589 Other disorders resulting from impaired renal tubular function: Secondary | ICD-10-CM | POA: Diagnosis not present

## 2016-01-12 DIAGNOSIS — N2589 Other disorders resulting from impaired renal tubular function: Secondary | ICD-10-CM | POA: Diagnosis not present

## 2016-01-12 DIAGNOSIS — D509 Iron deficiency anemia, unspecified: Secondary | ICD-10-CM | POA: Diagnosis not present

## 2016-01-12 DIAGNOSIS — N186 End stage renal disease: Secondary | ICD-10-CM | POA: Diagnosis not present

## 2016-01-12 DIAGNOSIS — N2581 Secondary hyperparathyroidism of renal origin: Secondary | ICD-10-CM | POA: Diagnosis not present

## 2016-01-12 DIAGNOSIS — D630 Anemia in neoplastic disease: Secondary | ICD-10-CM | POA: Diagnosis not present

## 2016-01-13 DIAGNOSIS — N186 End stage renal disease: Secondary | ICD-10-CM | POA: Diagnosis not present

## 2016-01-13 DIAGNOSIS — D509 Iron deficiency anemia, unspecified: Secondary | ICD-10-CM | POA: Diagnosis not present

## 2016-01-13 DIAGNOSIS — D630 Anemia in neoplastic disease: Secondary | ICD-10-CM | POA: Diagnosis not present

## 2016-01-13 DIAGNOSIS — N2589 Other disorders resulting from impaired renal tubular function: Secondary | ICD-10-CM | POA: Diagnosis not present

## 2016-01-13 DIAGNOSIS — N2581 Secondary hyperparathyroidism of renal origin: Secondary | ICD-10-CM | POA: Diagnosis not present

## 2016-01-14 DIAGNOSIS — D630 Anemia in neoplastic disease: Secondary | ICD-10-CM | POA: Diagnosis not present

## 2016-01-14 DIAGNOSIS — N186 End stage renal disease: Secondary | ICD-10-CM | POA: Diagnosis not present

## 2016-01-14 DIAGNOSIS — N2581 Secondary hyperparathyroidism of renal origin: Secondary | ICD-10-CM | POA: Diagnosis not present

## 2016-01-14 DIAGNOSIS — Z992 Dependence on renal dialysis: Secondary | ICD-10-CM | POA: Diagnosis not present

## 2016-01-14 DIAGNOSIS — D509 Iron deficiency anemia, unspecified: Secondary | ICD-10-CM | POA: Diagnosis not present

## 2016-01-14 DIAGNOSIS — N2589 Other disorders resulting from impaired renal tubular function: Secondary | ICD-10-CM | POA: Diagnosis not present

## 2016-01-15 DIAGNOSIS — D630 Anemia in neoplastic disease: Secondary | ICD-10-CM | POA: Diagnosis not present

## 2016-01-15 DIAGNOSIS — Z5189 Encounter for other specified aftercare: Secondary | ICD-10-CM | POA: Diagnosis not present

## 2016-01-15 DIAGNOSIS — N2589 Other disorders resulting from impaired renal tubular function: Secondary | ICD-10-CM | POA: Diagnosis not present

## 2016-01-15 DIAGNOSIS — N2581 Secondary hyperparathyroidism of renal origin: Secondary | ICD-10-CM | POA: Diagnosis not present

## 2016-01-15 DIAGNOSIS — E440 Moderate protein-calorie malnutrition: Secondary | ICD-10-CM | POA: Diagnosis not present

## 2016-01-15 DIAGNOSIS — Z79899 Other long term (current) drug therapy: Secondary | ICD-10-CM | POA: Diagnosis not present

## 2016-01-15 DIAGNOSIS — D509 Iron deficiency anemia, unspecified: Secondary | ICD-10-CM | POA: Diagnosis not present

## 2016-01-15 DIAGNOSIS — N186 End stage renal disease: Secondary | ICD-10-CM | POA: Diagnosis not present

## 2016-01-15 DIAGNOSIS — D631 Anemia in chronic kidney disease: Secondary | ICD-10-CM | POA: Diagnosis not present

## 2016-01-16 DIAGNOSIS — E440 Moderate protein-calorie malnutrition: Secondary | ICD-10-CM | POA: Diagnosis not present

## 2016-01-16 DIAGNOSIS — N186 End stage renal disease: Secondary | ICD-10-CM | POA: Diagnosis not present

## 2016-01-16 DIAGNOSIS — D631 Anemia in chronic kidney disease: Secondary | ICD-10-CM | POA: Diagnosis not present

## 2016-01-16 DIAGNOSIS — Z79899 Other long term (current) drug therapy: Secondary | ICD-10-CM | POA: Diagnosis not present

## 2016-01-16 DIAGNOSIS — D509 Iron deficiency anemia, unspecified: Secondary | ICD-10-CM | POA: Diagnosis not present

## 2016-01-16 DIAGNOSIS — N2589 Other disorders resulting from impaired renal tubular function: Secondary | ICD-10-CM | POA: Diagnosis not present

## 2016-01-17 DIAGNOSIS — N186 End stage renal disease: Secondary | ICD-10-CM | POA: Diagnosis not present

## 2016-01-17 DIAGNOSIS — D509 Iron deficiency anemia, unspecified: Secondary | ICD-10-CM | POA: Diagnosis not present

## 2016-01-17 DIAGNOSIS — D631 Anemia in chronic kidney disease: Secondary | ICD-10-CM | POA: Diagnosis not present

## 2016-01-17 DIAGNOSIS — Z79899 Other long term (current) drug therapy: Secondary | ICD-10-CM | POA: Diagnosis not present

## 2016-01-17 DIAGNOSIS — E440 Moderate protein-calorie malnutrition: Secondary | ICD-10-CM | POA: Diagnosis not present

## 2016-01-17 DIAGNOSIS — N2589 Other disorders resulting from impaired renal tubular function: Secondary | ICD-10-CM | POA: Diagnosis not present

## 2016-01-18 DIAGNOSIS — D631 Anemia in chronic kidney disease: Secondary | ICD-10-CM | POA: Diagnosis not present

## 2016-01-18 DIAGNOSIS — Z79899 Other long term (current) drug therapy: Secondary | ICD-10-CM | POA: Diagnosis not present

## 2016-01-18 DIAGNOSIS — N186 End stage renal disease: Secondary | ICD-10-CM | POA: Diagnosis not present

## 2016-01-18 DIAGNOSIS — N2589 Other disorders resulting from impaired renal tubular function: Secondary | ICD-10-CM | POA: Diagnosis not present

## 2016-01-18 DIAGNOSIS — E440 Moderate protein-calorie malnutrition: Secondary | ICD-10-CM | POA: Diagnosis not present

## 2016-01-18 DIAGNOSIS — R8299 Other abnormal findings in urine: Secondary | ICD-10-CM | POA: Diagnosis not present

## 2016-01-18 DIAGNOSIS — D509 Iron deficiency anemia, unspecified: Secondary | ICD-10-CM | POA: Diagnosis not present

## 2016-01-19 DIAGNOSIS — N186 End stage renal disease: Secondary | ICD-10-CM | POA: Diagnosis not present

## 2016-01-19 DIAGNOSIS — D631 Anemia in chronic kidney disease: Secondary | ICD-10-CM | POA: Diagnosis not present

## 2016-01-19 DIAGNOSIS — E440 Moderate protein-calorie malnutrition: Secondary | ICD-10-CM | POA: Diagnosis not present

## 2016-01-19 DIAGNOSIS — N2589 Other disorders resulting from impaired renal tubular function: Secondary | ICD-10-CM | POA: Diagnosis not present

## 2016-01-19 DIAGNOSIS — D509 Iron deficiency anemia, unspecified: Secondary | ICD-10-CM | POA: Diagnosis not present

## 2016-01-19 DIAGNOSIS — Z79899 Other long term (current) drug therapy: Secondary | ICD-10-CM | POA: Diagnosis not present

## 2016-01-20 DIAGNOSIS — D631 Anemia in chronic kidney disease: Secondary | ICD-10-CM | POA: Diagnosis not present

## 2016-01-20 DIAGNOSIS — Z79899 Other long term (current) drug therapy: Secondary | ICD-10-CM | POA: Diagnosis not present

## 2016-01-20 DIAGNOSIS — N2589 Other disorders resulting from impaired renal tubular function: Secondary | ICD-10-CM | POA: Diagnosis not present

## 2016-01-20 DIAGNOSIS — N186 End stage renal disease: Secondary | ICD-10-CM | POA: Diagnosis not present

## 2016-01-20 DIAGNOSIS — D509 Iron deficiency anemia, unspecified: Secondary | ICD-10-CM | POA: Diagnosis not present

## 2016-01-20 DIAGNOSIS — E440 Moderate protein-calorie malnutrition: Secondary | ICD-10-CM | POA: Diagnosis not present

## 2016-01-21 DIAGNOSIS — D509 Iron deficiency anemia, unspecified: Secondary | ICD-10-CM | POA: Diagnosis not present

## 2016-01-21 DIAGNOSIS — D631 Anemia in chronic kidney disease: Secondary | ICD-10-CM | POA: Diagnosis not present

## 2016-01-21 DIAGNOSIS — E440 Moderate protein-calorie malnutrition: Secondary | ICD-10-CM | POA: Diagnosis not present

## 2016-01-21 DIAGNOSIS — Z79899 Other long term (current) drug therapy: Secondary | ICD-10-CM | POA: Diagnosis not present

## 2016-01-21 DIAGNOSIS — N2589 Other disorders resulting from impaired renal tubular function: Secondary | ICD-10-CM | POA: Diagnosis not present

## 2016-01-21 DIAGNOSIS — N186 End stage renal disease: Secondary | ICD-10-CM | POA: Diagnosis not present

## 2016-01-22 DIAGNOSIS — N2589 Other disorders resulting from impaired renal tubular function: Secondary | ICD-10-CM | POA: Diagnosis not present

## 2016-01-22 DIAGNOSIS — D509 Iron deficiency anemia, unspecified: Secondary | ICD-10-CM | POA: Diagnosis not present

## 2016-01-22 DIAGNOSIS — E440 Moderate protein-calorie malnutrition: Secondary | ICD-10-CM | POA: Diagnosis not present

## 2016-01-22 DIAGNOSIS — Z79899 Other long term (current) drug therapy: Secondary | ICD-10-CM | POA: Diagnosis not present

## 2016-01-22 DIAGNOSIS — N186 End stage renal disease: Secondary | ICD-10-CM | POA: Diagnosis not present

## 2016-01-22 DIAGNOSIS — D631 Anemia in chronic kidney disease: Secondary | ICD-10-CM | POA: Diagnosis not present

## 2016-01-23 DIAGNOSIS — N2589 Other disorders resulting from impaired renal tubular function: Secondary | ICD-10-CM | POA: Diagnosis not present

## 2016-01-23 DIAGNOSIS — D631 Anemia in chronic kidney disease: Secondary | ICD-10-CM | POA: Diagnosis not present

## 2016-01-23 DIAGNOSIS — D509 Iron deficiency anemia, unspecified: Secondary | ICD-10-CM | POA: Diagnosis not present

## 2016-01-23 DIAGNOSIS — E440 Moderate protein-calorie malnutrition: Secondary | ICD-10-CM | POA: Diagnosis not present

## 2016-01-23 DIAGNOSIS — Z79899 Other long term (current) drug therapy: Secondary | ICD-10-CM | POA: Diagnosis not present

## 2016-01-23 DIAGNOSIS — N186 End stage renal disease: Secondary | ICD-10-CM | POA: Diagnosis not present

## 2016-01-24 DIAGNOSIS — D509 Iron deficiency anemia, unspecified: Secondary | ICD-10-CM | POA: Diagnosis not present

## 2016-01-24 DIAGNOSIS — N186 End stage renal disease: Secondary | ICD-10-CM | POA: Diagnosis not present

## 2016-01-24 DIAGNOSIS — N2589 Other disorders resulting from impaired renal tubular function: Secondary | ICD-10-CM | POA: Diagnosis not present

## 2016-01-24 DIAGNOSIS — Z79899 Other long term (current) drug therapy: Secondary | ICD-10-CM | POA: Diagnosis not present

## 2016-01-24 DIAGNOSIS — D631 Anemia in chronic kidney disease: Secondary | ICD-10-CM | POA: Diagnosis not present

## 2016-01-24 DIAGNOSIS — E440 Moderate protein-calorie malnutrition: Secondary | ICD-10-CM | POA: Diagnosis not present

## 2016-01-25 DIAGNOSIS — D509 Iron deficiency anemia, unspecified: Secondary | ICD-10-CM | POA: Diagnosis not present

## 2016-01-25 DIAGNOSIS — Z79899 Other long term (current) drug therapy: Secondary | ICD-10-CM | POA: Diagnosis not present

## 2016-01-25 DIAGNOSIS — N186 End stage renal disease: Secondary | ICD-10-CM | POA: Diagnosis not present

## 2016-01-25 DIAGNOSIS — E440 Moderate protein-calorie malnutrition: Secondary | ICD-10-CM | POA: Diagnosis not present

## 2016-01-25 DIAGNOSIS — N2589 Other disorders resulting from impaired renal tubular function: Secondary | ICD-10-CM | POA: Diagnosis not present

## 2016-01-25 DIAGNOSIS — D631 Anemia in chronic kidney disease: Secondary | ICD-10-CM | POA: Diagnosis not present

## 2016-01-26 DIAGNOSIS — D509 Iron deficiency anemia, unspecified: Secondary | ICD-10-CM | POA: Diagnosis not present

## 2016-01-26 DIAGNOSIS — D631 Anemia in chronic kidney disease: Secondary | ICD-10-CM | POA: Diagnosis not present

## 2016-01-26 DIAGNOSIS — N2589 Other disorders resulting from impaired renal tubular function: Secondary | ICD-10-CM | POA: Diagnosis not present

## 2016-01-26 DIAGNOSIS — E440 Moderate protein-calorie malnutrition: Secondary | ICD-10-CM | POA: Diagnosis not present

## 2016-01-26 DIAGNOSIS — N186 End stage renal disease: Secondary | ICD-10-CM | POA: Diagnosis not present

## 2016-01-26 DIAGNOSIS — Z79899 Other long term (current) drug therapy: Secondary | ICD-10-CM | POA: Diagnosis not present

## 2016-01-27 DIAGNOSIS — N2589 Other disorders resulting from impaired renal tubular function: Secondary | ICD-10-CM | POA: Diagnosis not present

## 2016-01-27 DIAGNOSIS — D631 Anemia in chronic kidney disease: Secondary | ICD-10-CM | POA: Diagnosis not present

## 2016-01-27 DIAGNOSIS — Z79899 Other long term (current) drug therapy: Secondary | ICD-10-CM | POA: Diagnosis not present

## 2016-01-27 DIAGNOSIS — D509 Iron deficiency anemia, unspecified: Secondary | ICD-10-CM | POA: Diagnosis not present

## 2016-01-27 DIAGNOSIS — E440 Moderate protein-calorie malnutrition: Secondary | ICD-10-CM | POA: Diagnosis not present

## 2016-01-27 DIAGNOSIS — N186 End stage renal disease: Secondary | ICD-10-CM | POA: Diagnosis not present

## 2016-01-28 DIAGNOSIS — E440 Moderate protein-calorie malnutrition: Secondary | ICD-10-CM | POA: Diagnosis not present

## 2016-01-28 DIAGNOSIS — N186 End stage renal disease: Secondary | ICD-10-CM | POA: Diagnosis not present

## 2016-01-28 DIAGNOSIS — Z79899 Other long term (current) drug therapy: Secondary | ICD-10-CM | POA: Diagnosis not present

## 2016-01-28 DIAGNOSIS — D509 Iron deficiency anemia, unspecified: Secondary | ICD-10-CM | POA: Diagnosis not present

## 2016-01-28 DIAGNOSIS — N2589 Other disorders resulting from impaired renal tubular function: Secondary | ICD-10-CM | POA: Diagnosis not present

## 2016-01-28 DIAGNOSIS — D631 Anemia in chronic kidney disease: Secondary | ICD-10-CM | POA: Diagnosis not present

## 2016-01-29 DIAGNOSIS — D631 Anemia in chronic kidney disease: Secondary | ICD-10-CM | POA: Diagnosis not present

## 2016-01-29 DIAGNOSIS — N2589 Other disorders resulting from impaired renal tubular function: Secondary | ICD-10-CM | POA: Diagnosis not present

## 2016-01-29 DIAGNOSIS — E440 Moderate protein-calorie malnutrition: Secondary | ICD-10-CM | POA: Diagnosis not present

## 2016-01-29 DIAGNOSIS — N186 End stage renal disease: Secondary | ICD-10-CM | POA: Diagnosis not present

## 2016-01-29 DIAGNOSIS — D509 Iron deficiency anemia, unspecified: Secondary | ICD-10-CM | POA: Diagnosis not present

## 2016-01-29 DIAGNOSIS — Z79899 Other long term (current) drug therapy: Secondary | ICD-10-CM | POA: Diagnosis not present

## 2016-01-30 DIAGNOSIS — N2589 Other disorders resulting from impaired renal tubular function: Secondary | ICD-10-CM | POA: Diagnosis not present

## 2016-01-30 DIAGNOSIS — D631 Anemia in chronic kidney disease: Secondary | ICD-10-CM | POA: Diagnosis not present

## 2016-01-30 DIAGNOSIS — E440 Moderate protein-calorie malnutrition: Secondary | ICD-10-CM | POA: Diagnosis not present

## 2016-01-30 DIAGNOSIS — Z79899 Other long term (current) drug therapy: Secondary | ICD-10-CM | POA: Diagnosis not present

## 2016-01-30 DIAGNOSIS — D509 Iron deficiency anemia, unspecified: Secondary | ICD-10-CM | POA: Diagnosis not present

## 2016-01-30 DIAGNOSIS — N186 End stage renal disease: Secondary | ICD-10-CM | POA: Diagnosis not present

## 2016-01-31 DIAGNOSIS — D631 Anemia in chronic kidney disease: Secondary | ICD-10-CM | POA: Diagnosis not present

## 2016-01-31 DIAGNOSIS — D509 Iron deficiency anemia, unspecified: Secondary | ICD-10-CM | POA: Diagnosis not present

## 2016-01-31 DIAGNOSIS — E440 Moderate protein-calorie malnutrition: Secondary | ICD-10-CM | POA: Diagnosis not present

## 2016-01-31 DIAGNOSIS — N186 End stage renal disease: Secondary | ICD-10-CM | POA: Diagnosis not present

## 2016-01-31 DIAGNOSIS — Z79899 Other long term (current) drug therapy: Secondary | ICD-10-CM | POA: Diagnosis not present

## 2016-01-31 DIAGNOSIS — N2589 Other disorders resulting from impaired renal tubular function: Secondary | ICD-10-CM | POA: Diagnosis not present

## 2016-02-01 DIAGNOSIS — Z79899 Other long term (current) drug therapy: Secondary | ICD-10-CM | POA: Diagnosis not present

## 2016-02-01 DIAGNOSIS — E440 Moderate protein-calorie malnutrition: Secondary | ICD-10-CM | POA: Diagnosis not present

## 2016-02-01 DIAGNOSIS — N2589 Other disorders resulting from impaired renal tubular function: Secondary | ICD-10-CM | POA: Diagnosis not present

## 2016-02-01 DIAGNOSIS — N186 End stage renal disease: Secondary | ICD-10-CM | POA: Diagnosis not present

## 2016-02-01 DIAGNOSIS — D631 Anemia in chronic kidney disease: Secondary | ICD-10-CM | POA: Diagnosis not present

## 2016-02-01 DIAGNOSIS — D509 Iron deficiency anemia, unspecified: Secondary | ICD-10-CM | POA: Diagnosis not present

## 2016-02-02 DIAGNOSIS — Z79899 Other long term (current) drug therapy: Secondary | ICD-10-CM | POA: Diagnosis not present

## 2016-02-02 DIAGNOSIS — D631 Anemia in chronic kidney disease: Secondary | ICD-10-CM | POA: Diagnosis not present

## 2016-02-02 DIAGNOSIS — N186 End stage renal disease: Secondary | ICD-10-CM | POA: Diagnosis not present

## 2016-02-02 DIAGNOSIS — E440 Moderate protein-calorie malnutrition: Secondary | ICD-10-CM | POA: Diagnosis not present

## 2016-02-02 DIAGNOSIS — D509 Iron deficiency anemia, unspecified: Secondary | ICD-10-CM | POA: Diagnosis not present

## 2016-02-02 DIAGNOSIS — N2589 Other disorders resulting from impaired renal tubular function: Secondary | ICD-10-CM | POA: Diagnosis not present

## 2016-02-03 DIAGNOSIS — D631 Anemia in chronic kidney disease: Secondary | ICD-10-CM | POA: Diagnosis not present

## 2016-02-03 DIAGNOSIS — E440 Moderate protein-calorie malnutrition: Secondary | ICD-10-CM | POA: Diagnosis not present

## 2016-02-03 DIAGNOSIS — D509 Iron deficiency anemia, unspecified: Secondary | ICD-10-CM | POA: Diagnosis not present

## 2016-02-03 DIAGNOSIS — Z79899 Other long term (current) drug therapy: Secondary | ICD-10-CM | POA: Diagnosis not present

## 2016-02-03 DIAGNOSIS — N186 End stage renal disease: Secondary | ICD-10-CM | POA: Diagnosis not present

## 2016-02-03 DIAGNOSIS — N2589 Other disorders resulting from impaired renal tubular function: Secondary | ICD-10-CM | POA: Diagnosis not present

## 2016-02-04 DIAGNOSIS — D509 Iron deficiency anemia, unspecified: Secondary | ICD-10-CM | POA: Diagnosis not present

## 2016-02-04 DIAGNOSIS — Z79899 Other long term (current) drug therapy: Secondary | ICD-10-CM | POA: Diagnosis not present

## 2016-02-04 DIAGNOSIS — N186 End stage renal disease: Secondary | ICD-10-CM | POA: Diagnosis not present

## 2016-02-04 DIAGNOSIS — E440 Moderate protein-calorie malnutrition: Secondary | ICD-10-CM | POA: Diagnosis not present

## 2016-02-04 DIAGNOSIS — D631 Anemia in chronic kidney disease: Secondary | ICD-10-CM | POA: Diagnosis not present

## 2016-02-04 DIAGNOSIS — N2589 Other disorders resulting from impaired renal tubular function: Secondary | ICD-10-CM | POA: Diagnosis not present

## 2016-02-05 DIAGNOSIS — D631 Anemia in chronic kidney disease: Secondary | ICD-10-CM | POA: Diagnosis not present

## 2016-02-05 DIAGNOSIS — D509 Iron deficiency anemia, unspecified: Secondary | ICD-10-CM | POA: Diagnosis not present

## 2016-02-05 DIAGNOSIS — N186 End stage renal disease: Secondary | ICD-10-CM | POA: Diagnosis not present

## 2016-02-05 DIAGNOSIS — N2589 Other disorders resulting from impaired renal tubular function: Secondary | ICD-10-CM | POA: Diagnosis not present

## 2016-02-05 DIAGNOSIS — E440 Moderate protein-calorie malnutrition: Secondary | ICD-10-CM | POA: Diagnosis not present

## 2016-02-05 DIAGNOSIS — Z79899 Other long term (current) drug therapy: Secondary | ICD-10-CM | POA: Diagnosis not present

## 2016-02-06 DIAGNOSIS — D631 Anemia in chronic kidney disease: Secondary | ICD-10-CM | POA: Diagnosis not present

## 2016-02-06 DIAGNOSIS — E440 Moderate protein-calorie malnutrition: Secondary | ICD-10-CM | POA: Diagnosis not present

## 2016-02-06 DIAGNOSIS — D509 Iron deficiency anemia, unspecified: Secondary | ICD-10-CM | POA: Diagnosis not present

## 2016-02-06 DIAGNOSIS — N186 End stage renal disease: Secondary | ICD-10-CM | POA: Diagnosis not present

## 2016-02-06 DIAGNOSIS — N2589 Other disorders resulting from impaired renal tubular function: Secondary | ICD-10-CM | POA: Diagnosis not present

## 2016-02-06 DIAGNOSIS — Z79899 Other long term (current) drug therapy: Secondary | ICD-10-CM | POA: Diagnosis not present

## 2016-02-07 DIAGNOSIS — D509 Iron deficiency anemia, unspecified: Secondary | ICD-10-CM | POA: Diagnosis not present

## 2016-02-07 DIAGNOSIS — D631 Anemia in chronic kidney disease: Secondary | ICD-10-CM | POA: Diagnosis not present

## 2016-02-07 DIAGNOSIS — N2589 Other disorders resulting from impaired renal tubular function: Secondary | ICD-10-CM | POA: Diagnosis not present

## 2016-02-07 DIAGNOSIS — N186 End stage renal disease: Secondary | ICD-10-CM | POA: Diagnosis not present

## 2016-02-07 DIAGNOSIS — E440 Moderate protein-calorie malnutrition: Secondary | ICD-10-CM | POA: Diagnosis not present

## 2016-02-07 DIAGNOSIS — Z79899 Other long term (current) drug therapy: Secondary | ICD-10-CM | POA: Diagnosis not present

## 2016-02-08 DIAGNOSIS — N186 End stage renal disease: Secondary | ICD-10-CM | POA: Diagnosis not present

## 2016-02-08 DIAGNOSIS — Z79899 Other long term (current) drug therapy: Secondary | ICD-10-CM | POA: Diagnosis not present

## 2016-02-08 DIAGNOSIS — D509 Iron deficiency anemia, unspecified: Secondary | ICD-10-CM | POA: Diagnosis not present

## 2016-02-08 DIAGNOSIS — N2589 Other disorders resulting from impaired renal tubular function: Secondary | ICD-10-CM | POA: Diagnosis not present

## 2016-02-08 DIAGNOSIS — D631 Anemia in chronic kidney disease: Secondary | ICD-10-CM | POA: Diagnosis not present

## 2016-02-08 DIAGNOSIS — E440 Moderate protein-calorie malnutrition: Secondary | ICD-10-CM | POA: Diagnosis not present

## 2016-02-09 DIAGNOSIS — N2589 Other disorders resulting from impaired renal tubular function: Secondary | ICD-10-CM | POA: Diagnosis not present

## 2016-02-09 DIAGNOSIS — Z79899 Other long term (current) drug therapy: Secondary | ICD-10-CM | POA: Diagnosis not present

## 2016-02-09 DIAGNOSIS — N186 End stage renal disease: Secondary | ICD-10-CM | POA: Diagnosis not present

## 2016-02-09 DIAGNOSIS — E440 Moderate protein-calorie malnutrition: Secondary | ICD-10-CM | POA: Diagnosis not present

## 2016-02-09 DIAGNOSIS — D509 Iron deficiency anemia, unspecified: Secondary | ICD-10-CM | POA: Diagnosis not present

## 2016-02-09 DIAGNOSIS — D631 Anemia in chronic kidney disease: Secondary | ICD-10-CM | POA: Diagnosis not present

## 2016-02-10 DIAGNOSIS — N186 End stage renal disease: Secondary | ICD-10-CM | POA: Diagnosis not present

## 2016-02-10 DIAGNOSIS — Z79899 Other long term (current) drug therapy: Secondary | ICD-10-CM | POA: Diagnosis not present

## 2016-02-10 DIAGNOSIS — N2589 Other disorders resulting from impaired renal tubular function: Secondary | ICD-10-CM | POA: Diagnosis not present

## 2016-02-10 DIAGNOSIS — E440 Moderate protein-calorie malnutrition: Secondary | ICD-10-CM | POA: Diagnosis not present

## 2016-02-10 DIAGNOSIS — D631 Anemia in chronic kidney disease: Secondary | ICD-10-CM | POA: Diagnosis not present

## 2016-02-10 DIAGNOSIS — D509 Iron deficiency anemia, unspecified: Secondary | ICD-10-CM | POA: Diagnosis not present

## 2016-02-11 DIAGNOSIS — N186 End stage renal disease: Secondary | ICD-10-CM | POA: Diagnosis not present

## 2016-02-11 DIAGNOSIS — D631 Anemia in chronic kidney disease: Secondary | ICD-10-CM | POA: Diagnosis not present

## 2016-02-11 DIAGNOSIS — E440 Moderate protein-calorie malnutrition: Secondary | ICD-10-CM | POA: Diagnosis not present

## 2016-02-11 DIAGNOSIS — Z79899 Other long term (current) drug therapy: Secondary | ICD-10-CM | POA: Diagnosis not present

## 2016-02-11 DIAGNOSIS — N2589 Other disorders resulting from impaired renal tubular function: Secondary | ICD-10-CM | POA: Diagnosis not present

## 2016-02-11 DIAGNOSIS — D509 Iron deficiency anemia, unspecified: Secondary | ICD-10-CM | POA: Diagnosis not present

## 2016-02-12 DIAGNOSIS — N2589 Other disorders resulting from impaired renal tubular function: Secondary | ICD-10-CM | POA: Diagnosis not present

## 2016-02-12 DIAGNOSIS — N186 End stage renal disease: Secondary | ICD-10-CM | POA: Diagnosis not present

## 2016-02-12 DIAGNOSIS — D509 Iron deficiency anemia, unspecified: Secondary | ICD-10-CM | POA: Diagnosis not present

## 2016-02-12 DIAGNOSIS — D631 Anemia in chronic kidney disease: Secondary | ICD-10-CM | POA: Diagnosis not present

## 2016-02-12 DIAGNOSIS — E440 Moderate protein-calorie malnutrition: Secondary | ICD-10-CM | POA: Diagnosis not present

## 2016-02-12 DIAGNOSIS — Z79899 Other long term (current) drug therapy: Secondary | ICD-10-CM | POA: Diagnosis not present

## 2016-02-13 DIAGNOSIS — N186 End stage renal disease: Secondary | ICD-10-CM | POA: Diagnosis not present

## 2016-02-13 DIAGNOSIS — E440 Moderate protein-calorie malnutrition: Secondary | ICD-10-CM | POA: Diagnosis not present

## 2016-02-13 DIAGNOSIS — Z79899 Other long term (current) drug therapy: Secondary | ICD-10-CM | POA: Diagnosis not present

## 2016-02-13 DIAGNOSIS — D509 Iron deficiency anemia, unspecified: Secondary | ICD-10-CM | POA: Diagnosis not present

## 2016-02-13 DIAGNOSIS — D631 Anemia in chronic kidney disease: Secondary | ICD-10-CM | POA: Diagnosis not present

## 2016-02-13 DIAGNOSIS — N2589 Other disorders resulting from impaired renal tubular function: Secondary | ICD-10-CM | POA: Diagnosis not present

## 2016-02-14 DIAGNOSIS — N186 End stage renal disease: Secondary | ICD-10-CM | POA: Diagnosis not present

## 2016-02-14 DIAGNOSIS — Z992 Dependence on renal dialysis: Secondary | ICD-10-CM | POA: Diagnosis not present

## 2016-02-14 DIAGNOSIS — Z79899 Other long term (current) drug therapy: Secondary | ICD-10-CM | POA: Diagnosis not present

## 2016-02-14 DIAGNOSIS — D631 Anemia in chronic kidney disease: Secondary | ICD-10-CM | POA: Diagnosis not present

## 2016-02-14 DIAGNOSIS — E440 Moderate protein-calorie malnutrition: Secondary | ICD-10-CM | POA: Diagnosis not present

## 2016-02-14 DIAGNOSIS — D509 Iron deficiency anemia, unspecified: Secondary | ICD-10-CM | POA: Diagnosis not present

## 2016-02-14 DIAGNOSIS — L700 Acne vulgaris: Secondary | ICD-10-CM | POA: Diagnosis not present

## 2016-02-14 DIAGNOSIS — N2589 Other disorders resulting from impaired renal tubular function: Secondary | ICD-10-CM | POA: Diagnosis not present

## 2016-02-15 DIAGNOSIS — D630 Anemia in neoplastic disease: Secondary | ICD-10-CM | POA: Diagnosis not present

## 2016-02-15 DIAGNOSIS — N186 End stage renal disease: Secondary | ICD-10-CM | POA: Diagnosis not present

## 2016-02-15 DIAGNOSIS — Z5189 Encounter for other specified aftercare: Secondary | ICD-10-CM | POA: Diagnosis not present

## 2016-02-15 DIAGNOSIS — D509 Iron deficiency anemia, unspecified: Secondary | ICD-10-CM | POA: Diagnosis not present

## 2016-02-15 DIAGNOSIS — Z79899 Other long term (current) drug therapy: Secondary | ICD-10-CM | POA: Diagnosis not present

## 2016-02-15 DIAGNOSIS — E440 Moderate protein-calorie malnutrition: Secondary | ICD-10-CM | POA: Diagnosis not present

## 2016-02-15 DIAGNOSIS — D631 Anemia in chronic kidney disease: Secondary | ICD-10-CM | POA: Diagnosis not present

## 2016-02-15 DIAGNOSIS — N2589 Other disorders resulting from impaired renal tubular function: Secondary | ICD-10-CM | POA: Diagnosis not present

## 2016-02-15 DIAGNOSIS — N2581 Secondary hyperparathyroidism of renal origin: Secondary | ICD-10-CM | POA: Diagnosis not present

## 2016-02-16 DIAGNOSIS — Z79899 Other long term (current) drug therapy: Secondary | ICD-10-CM | POA: Diagnosis not present

## 2016-02-16 DIAGNOSIS — D509 Iron deficiency anemia, unspecified: Secondary | ICD-10-CM | POA: Diagnosis not present

## 2016-02-16 DIAGNOSIS — N2589 Other disorders resulting from impaired renal tubular function: Secondary | ICD-10-CM | POA: Diagnosis not present

## 2016-02-16 DIAGNOSIS — Z5189 Encounter for other specified aftercare: Secondary | ICD-10-CM | POA: Diagnosis not present

## 2016-02-16 DIAGNOSIS — D630 Anemia in neoplastic disease: Secondary | ICD-10-CM | POA: Diagnosis not present

## 2016-02-16 DIAGNOSIS — N186 End stage renal disease: Secondary | ICD-10-CM | POA: Diagnosis not present

## 2016-02-17 DIAGNOSIS — D630 Anemia in neoplastic disease: Secondary | ICD-10-CM | POA: Diagnosis not present

## 2016-02-17 DIAGNOSIS — N186 End stage renal disease: Secondary | ICD-10-CM | POA: Diagnosis not present

## 2016-02-17 DIAGNOSIS — D509 Iron deficiency anemia, unspecified: Secondary | ICD-10-CM | POA: Diagnosis not present

## 2016-02-17 DIAGNOSIS — Z5189 Encounter for other specified aftercare: Secondary | ICD-10-CM | POA: Diagnosis not present

## 2016-02-17 DIAGNOSIS — Z79899 Other long term (current) drug therapy: Secondary | ICD-10-CM | POA: Diagnosis not present

## 2016-02-17 DIAGNOSIS — N2589 Other disorders resulting from impaired renal tubular function: Secondary | ICD-10-CM | POA: Diagnosis not present

## 2016-02-18 DIAGNOSIS — D630 Anemia in neoplastic disease: Secondary | ICD-10-CM | POA: Diagnosis not present

## 2016-02-18 DIAGNOSIS — N2589 Other disorders resulting from impaired renal tubular function: Secondary | ICD-10-CM | POA: Diagnosis not present

## 2016-02-18 DIAGNOSIS — Z5189 Encounter for other specified aftercare: Secondary | ICD-10-CM | POA: Diagnosis not present

## 2016-02-18 DIAGNOSIS — D509 Iron deficiency anemia, unspecified: Secondary | ICD-10-CM | POA: Diagnosis not present

## 2016-02-18 DIAGNOSIS — N186 End stage renal disease: Secondary | ICD-10-CM | POA: Diagnosis not present

## 2016-02-18 DIAGNOSIS — Z79899 Other long term (current) drug therapy: Secondary | ICD-10-CM | POA: Diagnosis not present

## 2016-02-19 DIAGNOSIS — D630 Anemia in neoplastic disease: Secondary | ICD-10-CM | POA: Diagnosis not present

## 2016-02-19 DIAGNOSIS — N186 End stage renal disease: Secondary | ICD-10-CM | POA: Diagnosis not present

## 2016-02-19 DIAGNOSIS — N2589 Other disorders resulting from impaired renal tubular function: Secondary | ICD-10-CM | POA: Diagnosis not present

## 2016-02-19 DIAGNOSIS — Z79899 Other long term (current) drug therapy: Secondary | ICD-10-CM | POA: Diagnosis not present

## 2016-02-19 DIAGNOSIS — D509 Iron deficiency anemia, unspecified: Secondary | ICD-10-CM | POA: Diagnosis not present

## 2016-02-19 DIAGNOSIS — Z5189 Encounter for other specified aftercare: Secondary | ICD-10-CM | POA: Diagnosis not present

## 2016-02-20 DIAGNOSIS — D509 Iron deficiency anemia, unspecified: Secondary | ICD-10-CM | POA: Diagnosis not present

## 2016-02-20 DIAGNOSIS — N186 End stage renal disease: Secondary | ICD-10-CM | POA: Diagnosis not present

## 2016-02-20 DIAGNOSIS — N2589 Other disorders resulting from impaired renal tubular function: Secondary | ICD-10-CM | POA: Diagnosis not present

## 2016-02-20 DIAGNOSIS — Z79899 Other long term (current) drug therapy: Secondary | ICD-10-CM | POA: Diagnosis not present

## 2016-02-20 DIAGNOSIS — D630 Anemia in neoplastic disease: Secondary | ICD-10-CM | POA: Diagnosis not present

## 2016-02-20 DIAGNOSIS — Z5189 Encounter for other specified aftercare: Secondary | ICD-10-CM | POA: Diagnosis not present

## 2016-02-21 DIAGNOSIS — N186 End stage renal disease: Secondary | ICD-10-CM | POA: Diagnosis not present

## 2016-02-21 DIAGNOSIS — D630 Anemia in neoplastic disease: Secondary | ICD-10-CM | POA: Diagnosis not present

## 2016-02-21 DIAGNOSIS — Z79899 Other long term (current) drug therapy: Secondary | ICD-10-CM | POA: Diagnosis not present

## 2016-02-21 DIAGNOSIS — R8299 Other abnormal findings in urine: Secondary | ICD-10-CM | POA: Diagnosis not present

## 2016-02-21 DIAGNOSIS — D509 Iron deficiency anemia, unspecified: Secondary | ICD-10-CM | POA: Diagnosis not present

## 2016-02-21 DIAGNOSIS — N2589 Other disorders resulting from impaired renal tubular function: Secondary | ICD-10-CM | POA: Diagnosis not present

## 2016-02-21 DIAGNOSIS — Z5189 Encounter for other specified aftercare: Secondary | ICD-10-CM | POA: Diagnosis not present

## 2016-02-22 DIAGNOSIS — Z5189 Encounter for other specified aftercare: Secondary | ICD-10-CM | POA: Diagnosis not present

## 2016-02-22 DIAGNOSIS — Z79899 Other long term (current) drug therapy: Secondary | ICD-10-CM | POA: Diagnosis not present

## 2016-02-22 DIAGNOSIS — D630 Anemia in neoplastic disease: Secondary | ICD-10-CM | POA: Diagnosis not present

## 2016-02-22 DIAGNOSIS — D509 Iron deficiency anemia, unspecified: Secondary | ICD-10-CM | POA: Diagnosis not present

## 2016-02-22 DIAGNOSIS — N2589 Other disorders resulting from impaired renal tubular function: Secondary | ICD-10-CM | POA: Diagnosis not present

## 2016-02-22 DIAGNOSIS — N186 End stage renal disease: Secondary | ICD-10-CM | POA: Diagnosis not present

## 2016-02-23 DIAGNOSIS — N2589 Other disorders resulting from impaired renal tubular function: Secondary | ICD-10-CM | POA: Diagnosis not present

## 2016-02-23 DIAGNOSIS — D630 Anemia in neoplastic disease: Secondary | ICD-10-CM | POA: Diagnosis not present

## 2016-02-23 DIAGNOSIS — Z79899 Other long term (current) drug therapy: Secondary | ICD-10-CM | POA: Diagnosis not present

## 2016-02-23 DIAGNOSIS — D509 Iron deficiency anemia, unspecified: Secondary | ICD-10-CM | POA: Diagnosis not present

## 2016-02-23 DIAGNOSIS — Z5189 Encounter for other specified aftercare: Secondary | ICD-10-CM | POA: Diagnosis not present

## 2016-02-23 DIAGNOSIS — N186 End stage renal disease: Secondary | ICD-10-CM | POA: Diagnosis not present

## 2016-02-24 DIAGNOSIS — N2589 Other disorders resulting from impaired renal tubular function: Secondary | ICD-10-CM | POA: Diagnosis not present

## 2016-02-24 DIAGNOSIS — Z79899 Other long term (current) drug therapy: Secondary | ICD-10-CM | POA: Diagnosis not present

## 2016-02-24 DIAGNOSIS — D630 Anemia in neoplastic disease: Secondary | ICD-10-CM | POA: Diagnosis not present

## 2016-02-24 DIAGNOSIS — N186 End stage renal disease: Secondary | ICD-10-CM | POA: Diagnosis not present

## 2016-02-24 DIAGNOSIS — D509 Iron deficiency anemia, unspecified: Secondary | ICD-10-CM | POA: Diagnosis not present

## 2016-02-24 DIAGNOSIS — Z5189 Encounter for other specified aftercare: Secondary | ICD-10-CM | POA: Diagnosis not present

## 2016-02-25 DIAGNOSIS — D509 Iron deficiency anemia, unspecified: Secondary | ICD-10-CM | POA: Diagnosis not present

## 2016-02-25 DIAGNOSIS — D630 Anemia in neoplastic disease: Secondary | ICD-10-CM | POA: Diagnosis not present

## 2016-02-25 DIAGNOSIS — Z79899 Other long term (current) drug therapy: Secondary | ICD-10-CM | POA: Diagnosis not present

## 2016-02-25 DIAGNOSIS — N2589 Other disorders resulting from impaired renal tubular function: Secondary | ICD-10-CM | POA: Diagnosis not present

## 2016-02-25 DIAGNOSIS — N186 End stage renal disease: Secondary | ICD-10-CM | POA: Diagnosis not present

## 2016-02-25 DIAGNOSIS — Z5189 Encounter for other specified aftercare: Secondary | ICD-10-CM | POA: Diagnosis not present

## 2016-02-26 DIAGNOSIS — D509 Iron deficiency anemia, unspecified: Secondary | ICD-10-CM | POA: Diagnosis not present

## 2016-02-26 DIAGNOSIS — N2589 Other disorders resulting from impaired renal tubular function: Secondary | ICD-10-CM | POA: Diagnosis not present

## 2016-02-26 DIAGNOSIS — Z79899 Other long term (current) drug therapy: Secondary | ICD-10-CM | POA: Diagnosis not present

## 2016-02-26 DIAGNOSIS — N186 End stage renal disease: Secondary | ICD-10-CM | POA: Diagnosis not present

## 2016-02-26 DIAGNOSIS — Z5189 Encounter for other specified aftercare: Secondary | ICD-10-CM | POA: Diagnosis not present

## 2016-02-26 DIAGNOSIS — D630 Anemia in neoplastic disease: Secondary | ICD-10-CM | POA: Diagnosis not present

## 2016-02-27 DIAGNOSIS — D509 Iron deficiency anemia, unspecified: Secondary | ICD-10-CM | POA: Diagnosis not present

## 2016-02-27 DIAGNOSIS — N186 End stage renal disease: Secondary | ICD-10-CM | POA: Diagnosis not present

## 2016-02-27 DIAGNOSIS — N2589 Other disorders resulting from impaired renal tubular function: Secondary | ICD-10-CM | POA: Diagnosis not present

## 2016-02-27 DIAGNOSIS — D630 Anemia in neoplastic disease: Secondary | ICD-10-CM | POA: Diagnosis not present

## 2016-02-27 DIAGNOSIS — Z5189 Encounter for other specified aftercare: Secondary | ICD-10-CM | POA: Diagnosis not present

## 2016-02-27 DIAGNOSIS — Z79899 Other long term (current) drug therapy: Secondary | ICD-10-CM | POA: Diagnosis not present

## 2016-02-28 DIAGNOSIS — D509 Iron deficiency anemia, unspecified: Secondary | ICD-10-CM | POA: Diagnosis not present

## 2016-02-28 DIAGNOSIS — Z79899 Other long term (current) drug therapy: Secondary | ICD-10-CM | POA: Diagnosis not present

## 2016-02-28 DIAGNOSIS — N186 End stage renal disease: Secondary | ICD-10-CM | POA: Diagnosis not present

## 2016-02-28 DIAGNOSIS — Z5189 Encounter for other specified aftercare: Secondary | ICD-10-CM | POA: Diagnosis not present

## 2016-02-28 DIAGNOSIS — N2589 Other disorders resulting from impaired renal tubular function: Secondary | ICD-10-CM | POA: Diagnosis not present

## 2016-02-28 DIAGNOSIS — D630 Anemia in neoplastic disease: Secondary | ICD-10-CM | POA: Diagnosis not present

## 2016-02-29 DIAGNOSIS — Z79899 Other long term (current) drug therapy: Secondary | ICD-10-CM | POA: Diagnosis not present

## 2016-02-29 DIAGNOSIS — N186 End stage renal disease: Secondary | ICD-10-CM | POA: Diagnosis not present

## 2016-02-29 DIAGNOSIS — N2589 Other disorders resulting from impaired renal tubular function: Secondary | ICD-10-CM | POA: Diagnosis not present

## 2016-02-29 DIAGNOSIS — D630 Anemia in neoplastic disease: Secondary | ICD-10-CM | POA: Diagnosis not present

## 2016-02-29 DIAGNOSIS — D509 Iron deficiency anemia, unspecified: Secondary | ICD-10-CM | POA: Diagnosis not present

## 2016-02-29 DIAGNOSIS — Z5189 Encounter for other specified aftercare: Secondary | ICD-10-CM | POA: Diagnosis not present

## 2016-03-01 DIAGNOSIS — D630 Anemia in neoplastic disease: Secondary | ICD-10-CM | POA: Diagnosis not present

## 2016-03-01 DIAGNOSIS — D509 Iron deficiency anemia, unspecified: Secondary | ICD-10-CM | POA: Diagnosis not present

## 2016-03-01 DIAGNOSIS — Z5189 Encounter for other specified aftercare: Secondary | ICD-10-CM | POA: Diagnosis not present

## 2016-03-01 DIAGNOSIS — Z79899 Other long term (current) drug therapy: Secondary | ICD-10-CM | POA: Diagnosis not present

## 2016-03-01 DIAGNOSIS — N186 End stage renal disease: Secondary | ICD-10-CM | POA: Diagnosis not present

## 2016-03-01 DIAGNOSIS — N2589 Other disorders resulting from impaired renal tubular function: Secondary | ICD-10-CM | POA: Diagnosis not present

## 2016-03-02 DIAGNOSIS — D509 Iron deficiency anemia, unspecified: Secondary | ICD-10-CM | POA: Diagnosis not present

## 2016-03-02 DIAGNOSIS — N186 End stage renal disease: Secondary | ICD-10-CM | POA: Diagnosis not present

## 2016-03-02 DIAGNOSIS — Z79899 Other long term (current) drug therapy: Secondary | ICD-10-CM | POA: Diagnosis not present

## 2016-03-02 DIAGNOSIS — D630 Anemia in neoplastic disease: Secondary | ICD-10-CM | POA: Diagnosis not present

## 2016-03-02 DIAGNOSIS — Z5189 Encounter for other specified aftercare: Secondary | ICD-10-CM | POA: Diagnosis not present

## 2016-03-02 DIAGNOSIS — N2589 Other disorders resulting from impaired renal tubular function: Secondary | ICD-10-CM | POA: Diagnosis not present

## 2016-03-03 DIAGNOSIS — Z79899 Other long term (current) drug therapy: Secondary | ICD-10-CM | POA: Diagnosis not present

## 2016-03-03 DIAGNOSIS — N2589 Other disorders resulting from impaired renal tubular function: Secondary | ICD-10-CM | POA: Diagnosis not present

## 2016-03-03 DIAGNOSIS — N186 End stage renal disease: Secondary | ICD-10-CM | POA: Diagnosis not present

## 2016-03-03 DIAGNOSIS — D630 Anemia in neoplastic disease: Secondary | ICD-10-CM | POA: Diagnosis not present

## 2016-03-03 DIAGNOSIS — Z5189 Encounter for other specified aftercare: Secondary | ICD-10-CM | POA: Diagnosis not present

## 2016-03-03 DIAGNOSIS — D509 Iron deficiency anemia, unspecified: Secondary | ICD-10-CM | POA: Diagnosis not present

## 2016-03-04 DIAGNOSIS — D630 Anemia in neoplastic disease: Secondary | ICD-10-CM | POA: Diagnosis not present

## 2016-03-04 DIAGNOSIS — D509 Iron deficiency anemia, unspecified: Secondary | ICD-10-CM | POA: Diagnosis not present

## 2016-03-04 DIAGNOSIS — Z5189 Encounter for other specified aftercare: Secondary | ICD-10-CM | POA: Diagnosis not present

## 2016-03-04 DIAGNOSIS — Z79899 Other long term (current) drug therapy: Secondary | ICD-10-CM | POA: Diagnosis not present

## 2016-03-04 DIAGNOSIS — N2589 Other disorders resulting from impaired renal tubular function: Secondary | ICD-10-CM | POA: Diagnosis not present

## 2016-03-04 DIAGNOSIS — N186 End stage renal disease: Secondary | ICD-10-CM | POA: Diagnosis not present

## 2016-03-05 DIAGNOSIS — N2589 Other disorders resulting from impaired renal tubular function: Secondary | ICD-10-CM | POA: Diagnosis not present

## 2016-03-05 DIAGNOSIS — D630 Anemia in neoplastic disease: Secondary | ICD-10-CM | POA: Diagnosis not present

## 2016-03-05 DIAGNOSIS — Z5189 Encounter for other specified aftercare: Secondary | ICD-10-CM | POA: Diagnosis not present

## 2016-03-05 DIAGNOSIS — N186 End stage renal disease: Secondary | ICD-10-CM | POA: Diagnosis not present

## 2016-03-05 DIAGNOSIS — D509 Iron deficiency anemia, unspecified: Secondary | ICD-10-CM | POA: Diagnosis not present

## 2016-03-05 DIAGNOSIS — Z79899 Other long term (current) drug therapy: Secondary | ICD-10-CM | POA: Diagnosis not present

## 2016-03-06 DIAGNOSIS — D630 Anemia in neoplastic disease: Secondary | ICD-10-CM | POA: Diagnosis not present

## 2016-03-06 DIAGNOSIS — N2589 Other disorders resulting from impaired renal tubular function: Secondary | ICD-10-CM | POA: Diagnosis not present

## 2016-03-06 DIAGNOSIS — N186 End stage renal disease: Secondary | ICD-10-CM | POA: Diagnosis not present

## 2016-03-06 DIAGNOSIS — Z5189 Encounter for other specified aftercare: Secondary | ICD-10-CM | POA: Diagnosis not present

## 2016-03-06 DIAGNOSIS — D509 Iron deficiency anemia, unspecified: Secondary | ICD-10-CM | POA: Diagnosis not present

## 2016-03-06 DIAGNOSIS — Z79899 Other long term (current) drug therapy: Secondary | ICD-10-CM | POA: Diagnosis not present

## 2016-03-07 DIAGNOSIS — Z79899 Other long term (current) drug therapy: Secondary | ICD-10-CM | POA: Diagnosis not present

## 2016-03-07 DIAGNOSIS — N2589 Other disorders resulting from impaired renal tubular function: Secondary | ICD-10-CM | POA: Diagnosis not present

## 2016-03-07 DIAGNOSIS — Z5189 Encounter for other specified aftercare: Secondary | ICD-10-CM | POA: Diagnosis not present

## 2016-03-07 DIAGNOSIS — N186 End stage renal disease: Secondary | ICD-10-CM | POA: Diagnosis not present

## 2016-03-07 DIAGNOSIS — D509 Iron deficiency anemia, unspecified: Secondary | ICD-10-CM | POA: Diagnosis not present

## 2016-03-07 DIAGNOSIS — D630 Anemia in neoplastic disease: Secondary | ICD-10-CM | POA: Diagnosis not present

## 2016-03-08 DIAGNOSIS — N186 End stage renal disease: Secondary | ICD-10-CM | POA: Diagnosis not present

## 2016-03-08 DIAGNOSIS — Z5189 Encounter for other specified aftercare: Secondary | ICD-10-CM | POA: Diagnosis not present

## 2016-03-08 DIAGNOSIS — N2589 Other disorders resulting from impaired renal tubular function: Secondary | ICD-10-CM | POA: Diagnosis not present

## 2016-03-08 DIAGNOSIS — D630 Anemia in neoplastic disease: Secondary | ICD-10-CM | POA: Diagnosis not present

## 2016-03-08 DIAGNOSIS — Z79899 Other long term (current) drug therapy: Secondary | ICD-10-CM | POA: Diagnosis not present

## 2016-03-08 DIAGNOSIS — D509 Iron deficiency anemia, unspecified: Secondary | ICD-10-CM | POA: Diagnosis not present

## 2016-03-09 DIAGNOSIS — N186 End stage renal disease: Secondary | ICD-10-CM | POA: Diagnosis not present

## 2016-03-09 DIAGNOSIS — D630 Anemia in neoplastic disease: Secondary | ICD-10-CM | POA: Diagnosis not present

## 2016-03-09 DIAGNOSIS — Z79899 Other long term (current) drug therapy: Secondary | ICD-10-CM | POA: Diagnosis not present

## 2016-03-09 DIAGNOSIS — N2589 Other disorders resulting from impaired renal tubular function: Secondary | ICD-10-CM | POA: Diagnosis not present

## 2016-03-09 DIAGNOSIS — Z5189 Encounter for other specified aftercare: Secondary | ICD-10-CM | POA: Diagnosis not present

## 2016-03-09 DIAGNOSIS — D509 Iron deficiency anemia, unspecified: Secondary | ICD-10-CM | POA: Diagnosis not present

## 2016-03-10 DIAGNOSIS — Z5189 Encounter for other specified aftercare: Secondary | ICD-10-CM | POA: Diagnosis not present

## 2016-03-10 DIAGNOSIS — D630 Anemia in neoplastic disease: Secondary | ICD-10-CM | POA: Diagnosis not present

## 2016-03-10 DIAGNOSIS — N186 End stage renal disease: Secondary | ICD-10-CM | POA: Diagnosis not present

## 2016-03-10 DIAGNOSIS — Z79899 Other long term (current) drug therapy: Secondary | ICD-10-CM | POA: Diagnosis not present

## 2016-03-10 DIAGNOSIS — D509 Iron deficiency anemia, unspecified: Secondary | ICD-10-CM | POA: Diagnosis not present

## 2016-03-10 DIAGNOSIS — N2589 Other disorders resulting from impaired renal tubular function: Secondary | ICD-10-CM | POA: Diagnosis not present

## 2016-03-11 DIAGNOSIS — N186 End stage renal disease: Secondary | ICD-10-CM | POA: Diagnosis not present

## 2016-03-11 DIAGNOSIS — Z79899 Other long term (current) drug therapy: Secondary | ICD-10-CM | POA: Diagnosis not present

## 2016-03-11 DIAGNOSIS — D630 Anemia in neoplastic disease: Secondary | ICD-10-CM | POA: Diagnosis not present

## 2016-03-11 DIAGNOSIS — D509 Iron deficiency anemia, unspecified: Secondary | ICD-10-CM | POA: Diagnosis not present

## 2016-03-11 DIAGNOSIS — N2589 Other disorders resulting from impaired renal tubular function: Secondary | ICD-10-CM | POA: Diagnosis not present

## 2016-03-11 DIAGNOSIS — Z5189 Encounter for other specified aftercare: Secondary | ICD-10-CM | POA: Diagnosis not present

## 2016-03-12 DIAGNOSIS — N2589 Other disorders resulting from impaired renal tubular function: Secondary | ICD-10-CM | POA: Diagnosis not present

## 2016-03-12 DIAGNOSIS — D509 Iron deficiency anemia, unspecified: Secondary | ICD-10-CM | POA: Diagnosis not present

## 2016-03-12 DIAGNOSIS — N186 End stage renal disease: Secondary | ICD-10-CM | POA: Diagnosis not present

## 2016-03-12 DIAGNOSIS — Z5189 Encounter for other specified aftercare: Secondary | ICD-10-CM | POA: Diagnosis not present

## 2016-03-12 DIAGNOSIS — D630 Anemia in neoplastic disease: Secondary | ICD-10-CM | POA: Diagnosis not present

## 2016-03-12 DIAGNOSIS — Z79899 Other long term (current) drug therapy: Secondary | ICD-10-CM | POA: Diagnosis not present

## 2016-03-13 DIAGNOSIS — Z992 Dependence on renal dialysis: Secondary | ICD-10-CM | POA: Diagnosis not present

## 2016-03-13 DIAGNOSIS — N186 End stage renal disease: Secondary | ICD-10-CM | POA: Diagnosis not present

## 2016-03-13 DIAGNOSIS — N2589 Other disorders resulting from impaired renal tubular function: Secondary | ICD-10-CM | POA: Diagnosis not present

## 2016-03-13 DIAGNOSIS — D630 Anemia in neoplastic disease: Secondary | ICD-10-CM | POA: Diagnosis not present

## 2016-03-13 DIAGNOSIS — Z79899 Other long term (current) drug therapy: Secondary | ICD-10-CM | POA: Diagnosis not present

## 2016-03-13 DIAGNOSIS — Z5189 Encounter for other specified aftercare: Secondary | ICD-10-CM | POA: Diagnosis not present

## 2016-03-13 DIAGNOSIS — D509 Iron deficiency anemia, unspecified: Secondary | ICD-10-CM | POA: Diagnosis not present

## 2016-03-14 DIAGNOSIS — N186 End stage renal disease: Secondary | ICD-10-CM | POA: Diagnosis not present

## 2016-03-14 DIAGNOSIS — N2581 Secondary hyperparathyroidism of renal origin: Secondary | ICD-10-CM | POA: Diagnosis not present

## 2016-03-14 DIAGNOSIS — D509 Iron deficiency anemia, unspecified: Secondary | ICD-10-CM | POA: Diagnosis not present

## 2016-03-14 DIAGNOSIS — D630 Anemia in neoplastic disease: Secondary | ICD-10-CM | POA: Diagnosis not present

## 2016-03-14 DIAGNOSIS — N2589 Other disorders resulting from impaired renal tubular function: Secondary | ICD-10-CM | POA: Diagnosis not present

## 2016-03-15 DIAGNOSIS — D509 Iron deficiency anemia, unspecified: Secondary | ICD-10-CM | POA: Diagnosis not present

## 2016-03-15 DIAGNOSIS — N186 End stage renal disease: Secondary | ICD-10-CM | POA: Diagnosis not present

## 2016-03-15 DIAGNOSIS — N2581 Secondary hyperparathyroidism of renal origin: Secondary | ICD-10-CM | POA: Diagnosis not present

## 2016-03-15 DIAGNOSIS — E784 Other hyperlipidemia: Secondary | ICD-10-CM | POA: Diagnosis not present

## 2016-03-15 DIAGNOSIS — R8299 Other abnormal findings in urine: Secondary | ICD-10-CM | POA: Diagnosis not present

## 2016-03-15 DIAGNOSIS — N2589 Other disorders resulting from impaired renal tubular function: Secondary | ICD-10-CM | POA: Diagnosis not present

## 2016-03-15 DIAGNOSIS — D630 Anemia in neoplastic disease: Secondary | ICD-10-CM | POA: Diagnosis not present

## 2016-03-16 DIAGNOSIS — N2581 Secondary hyperparathyroidism of renal origin: Secondary | ICD-10-CM | POA: Diagnosis not present

## 2016-03-16 DIAGNOSIS — D509 Iron deficiency anemia, unspecified: Secondary | ICD-10-CM | POA: Diagnosis not present

## 2016-03-16 DIAGNOSIS — N2589 Other disorders resulting from impaired renal tubular function: Secondary | ICD-10-CM | POA: Diagnosis not present

## 2016-03-16 DIAGNOSIS — N186 End stage renal disease: Secondary | ICD-10-CM | POA: Diagnosis not present

## 2016-03-16 DIAGNOSIS — D630 Anemia in neoplastic disease: Secondary | ICD-10-CM | POA: Diagnosis not present

## 2016-03-17 DIAGNOSIS — D509 Iron deficiency anemia, unspecified: Secondary | ICD-10-CM | POA: Diagnosis not present

## 2016-03-17 DIAGNOSIS — D630 Anemia in neoplastic disease: Secondary | ICD-10-CM | POA: Diagnosis not present

## 2016-03-17 DIAGNOSIS — N186 End stage renal disease: Secondary | ICD-10-CM | POA: Diagnosis not present

## 2016-03-17 DIAGNOSIS — N2589 Other disorders resulting from impaired renal tubular function: Secondary | ICD-10-CM | POA: Diagnosis not present

## 2016-03-17 DIAGNOSIS — N2581 Secondary hyperparathyroidism of renal origin: Secondary | ICD-10-CM | POA: Diagnosis not present

## 2016-03-18 DIAGNOSIS — N186 End stage renal disease: Secondary | ICD-10-CM | POA: Diagnosis not present

## 2016-03-18 DIAGNOSIS — D509 Iron deficiency anemia, unspecified: Secondary | ICD-10-CM | POA: Diagnosis not present

## 2016-03-18 DIAGNOSIS — N2581 Secondary hyperparathyroidism of renal origin: Secondary | ICD-10-CM | POA: Diagnosis not present

## 2016-03-18 DIAGNOSIS — N2589 Other disorders resulting from impaired renal tubular function: Secondary | ICD-10-CM | POA: Diagnosis not present

## 2016-03-18 DIAGNOSIS — D630 Anemia in neoplastic disease: Secondary | ICD-10-CM | POA: Diagnosis not present

## 2016-03-19 DIAGNOSIS — N2589 Other disorders resulting from impaired renal tubular function: Secondary | ICD-10-CM | POA: Diagnosis not present

## 2016-03-19 DIAGNOSIS — N186 End stage renal disease: Secondary | ICD-10-CM | POA: Diagnosis not present

## 2016-03-19 DIAGNOSIS — N2581 Secondary hyperparathyroidism of renal origin: Secondary | ICD-10-CM | POA: Diagnosis not present

## 2016-03-19 DIAGNOSIS — D630 Anemia in neoplastic disease: Secondary | ICD-10-CM | POA: Diagnosis not present

## 2016-03-19 DIAGNOSIS — D509 Iron deficiency anemia, unspecified: Secondary | ICD-10-CM | POA: Diagnosis not present

## 2016-03-20 DIAGNOSIS — N2581 Secondary hyperparathyroidism of renal origin: Secondary | ICD-10-CM | POA: Diagnosis not present

## 2016-03-20 DIAGNOSIS — N186 End stage renal disease: Secondary | ICD-10-CM | POA: Diagnosis not present

## 2016-03-20 DIAGNOSIS — D509 Iron deficiency anemia, unspecified: Secondary | ICD-10-CM | POA: Diagnosis not present

## 2016-03-20 DIAGNOSIS — N2589 Other disorders resulting from impaired renal tubular function: Secondary | ICD-10-CM | POA: Diagnosis not present

## 2016-03-20 DIAGNOSIS — D630 Anemia in neoplastic disease: Secondary | ICD-10-CM | POA: Diagnosis not present

## 2016-03-21 DIAGNOSIS — D630 Anemia in neoplastic disease: Secondary | ICD-10-CM | POA: Diagnosis not present

## 2016-03-21 DIAGNOSIS — D509 Iron deficiency anemia, unspecified: Secondary | ICD-10-CM | POA: Diagnosis not present

## 2016-03-21 DIAGNOSIS — N186 End stage renal disease: Secondary | ICD-10-CM | POA: Diagnosis not present

## 2016-03-21 DIAGNOSIS — N2581 Secondary hyperparathyroidism of renal origin: Secondary | ICD-10-CM | POA: Diagnosis not present

## 2016-03-21 DIAGNOSIS — N2589 Other disorders resulting from impaired renal tubular function: Secondary | ICD-10-CM | POA: Diagnosis not present

## 2016-03-21 DIAGNOSIS — E049 Nontoxic goiter, unspecified: Secondary | ICD-10-CM | POA: Diagnosis not present

## 2016-03-21 DIAGNOSIS — N912 Amenorrhea, unspecified: Secondary | ICD-10-CM | POA: Diagnosis not present

## 2016-03-21 DIAGNOSIS — Z124 Encounter for screening for malignant neoplasm of cervix: Secondary | ICD-10-CM | POA: Diagnosis not present

## 2016-03-22 DIAGNOSIS — D630 Anemia in neoplastic disease: Secondary | ICD-10-CM | POA: Diagnosis not present

## 2016-03-22 DIAGNOSIS — N2589 Other disorders resulting from impaired renal tubular function: Secondary | ICD-10-CM | POA: Diagnosis not present

## 2016-03-22 DIAGNOSIS — N2581 Secondary hyperparathyroidism of renal origin: Secondary | ICD-10-CM | POA: Diagnosis not present

## 2016-03-22 DIAGNOSIS — N186 End stage renal disease: Secondary | ICD-10-CM | POA: Diagnosis not present

## 2016-03-22 DIAGNOSIS — D509 Iron deficiency anemia, unspecified: Secondary | ICD-10-CM | POA: Diagnosis not present

## 2016-03-23 DIAGNOSIS — N2581 Secondary hyperparathyroidism of renal origin: Secondary | ICD-10-CM | POA: Diagnosis not present

## 2016-03-23 DIAGNOSIS — D509 Iron deficiency anemia, unspecified: Secondary | ICD-10-CM | POA: Diagnosis not present

## 2016-03-23 DIAGNOSIS — N186 End stage renal disease: Secondary | ICD-10-CM | POA: Diagnosis not present

## 2016-03-23 DIAGNOSIS — D630 Anemia in neoplastic disease: Secondary | ICD-10-CM | POA: Diagnosis not present

## 2016-03-23 DIAGNOSIS — N2589 Other disorders resulting from impaired renal tubular function: Secondary | ICD-10-CM | POA: Diagnosis not present

## 2016-03-24 DIAGNOSIS — N2589 Other disorders resulting from impaired renal tubular function: Secondary | ICD-10-CM | POA: Diagnosis not present

## 2016-03-24 DIAGNOSIS — N2581 Secondary hyperparathyroidism of renal origin: Secondary | ICD-10-CM | POA: Diagnosis not present

## 2016-03-24 DIAGNOSIS — D630 Anemia in neoplastic disease: Secondary | ICD-10-CM | POA: Diagnosis not present

## 2016-03-24 DIAGNOSIS — D509 Iron deficiency anemia, unspecified: Secondary | ICD-10-CM | POA: Diagnosis not present

## 2016-03-24 DIAGNOSIS — N186 End stage renal disease: Secondary | ICD-10-CM | POA: Diagnosis not present

## 2016-03-25 DIAGNOSIS — N2589 Other disorders resulting from impaired renal tubular function: Secondary | ICD-10-CM | POA: Diagnosis not present

## 2016-03-25 DIAGNOSIS — N2581 Secondary hyperparathyroidism of renal origin: Secondary | ICD-10-CM | POA: Diagnosis not present

## 2016-03-25 DIAGNOSIS — D630 Anemia in neoplastic disease: Secondary | ICD-10-CM | POA: Diagnosis not present

## 2016-03-25 DIAGNOSIS — N186 End stage renal disease: Secondary | ICD-10-CM | POA: Diagnosis not present

## 2016-03-25 DIAGNOSIS — D509 Iron deficiency anemia, unspecified: Secondary | ICD-10-CM | POA: Diagnosis not present

## 2016-03-26 DIAGNOSIS — D630 Anemia in neoplastic disease: Secondary | ICD-10-CM | POA: Diagnosis not present

## 2016-03-26 DIAGNOSIS — D509 Iron deficiency anemia, unspecified: Secondary | ICD-10-CM | POA: Diagnosis not present

## 2016-03-26 DIAGNOSIS — N2581 Secondary hyperparathyroidism of renal origin: Secondary | ICD-10-CM | POA: Diagnosis not present

## 2016-03-26 DIAGNOSIS — N2589 Other disorders resulting from impaired renal tubular function: Secondary | ICD-10-CM | POA: Diagnosis not present

## 2016-03-26 DIAGNOSIS — N186 End stage renal disease: Secondary | ICD-10-CM | POA: Diagnosis not present

## 2016-03-27 DIAGNOSIS — N2581 Secondary hyperparathyroidism of renal origin: Secondary | ICD-10-CM | POA: Diagnosis not present

## 2016-03-27 DIAGNOSIS — D630 Anemia in neoplastic disease: Secondary | ICD-10-CM | POA: Diagnosis not present

## 2016-03-27 DIAGNOSIS — N2589 Other disorders resulting from impaired renal tubular function: Secondary | ICD-10-CM | POA: Diagnosis not present

## 2016-03-27 DIAGNOSIS — N186 End stage renal disease: Secondary | ICD-10-CM | POA: Diagnosis not present

## 2016-03-27 DIAGNOSIS — D509 Iron deficiency anemia, unspecified: Secondary | ICD-10-CM | POA: Diagnosis not present

## 2016-03-28 DIAGNOSIS — D509 Iron deficiency anemia, unspecified: Secondary | ICD-10-CM | POA: Diagnosis not present

## 2016-03-28 DIAGNOSIS — N2581 Secondary hyperparathyroidism of renal origin: Secondary | ICD-10-CM | POA: Diagnosis not present

## 2016-03-28 DIAGNOSIS — D630 Anemia in neoplastic disease: Secondary | ICD-10-CM | POA: Diagnosis not present

## 2016-03-28 DIAGNOSIS — N186 End stage renal disease: Secondary | ICD-10-CM | POA: Diagnosis not present

## 2016-03-28 DIAGNOSIS — N2589 Other disorders resulting from impaired renal tubular function: Secondary | ICD-10-CM | POA: Diagnosis not present

## 2016-03-29 DIAGNOSIS — N2581 Secondary hyperparathyroidism of renal origin: Secondary | ICD-10-CM | POA: Diagnosis not present

## 2016-03-29 DIAGNOSIS — N2589 Other disorders resulting from impaired renal tubular function: Secondary | ICD-10-CM | POA: Diagnosis not present

## 2016-03-29 DIAGNOSIS — D630 Anemia in neoplastic disease: Secondary | ICD-10-CM | POA: Diagnosis not present

## 2016-03-29 DIAGNOSIS — D509 Iron deficiency anemia, unspecified: Secondary | ICD-10-CM | POA: Diagnosis not present

## 2016-03-29 DIAGNOSIS — N186 End stage renal disease: Secondary | ICD-10-CM | POA: Diagnosis not present

## 2016-03-30 DIAGNOSIS — N186 End stage renal disease: Secondary | ICD-10-CM | POA: Diagnosis not present

## 2016-03-30 DIAGNOSIS — N2581 Secondary hyperparathyroidism of renal origin: Secondary | ICD-10-CM | POA: Diagnosis not present

## 2016-03-30 DIAGNOSIS — D509 Iron deficiency anemia, unspecified: Secondary | ICD-10-CM | POA: Diagnosis not present

## 2016-03-30 DIAGNOSIS — D630 Anemia in neoplastic disease: Secondary | ICD-10-CM | POA: Diagnosis not present

## 2016-03-30 DIAGNOSIS — N2589 Other disorders resulting from impaired renal tubular function: Secondary | ICD-10-CM | POA: Diagnosis not present

## 2016-03-31 DIAGNOSIS — D630 Anemia in neoplastic disease: Secondary | ICD-10-CM | POA: Diagnosis not present

## 2016-03-31 DIAGNOSIS — N186 End stage renal disease: Secondary | ICD-10-CM | POA: Diagnosis not present

## 2016-03-31 DIAGNOSIS — N2589 Other disorders resulting from impaired renal tubular function: Secondary | ICD-10-CM | POA: Diagnosis not present

## 2016-03-31 DIAGNOSIS — N2581 Secondary hyperparathyroidism of renal origin: Secondary | ICD-10-CM | POA: Diagnosis not present

## 2016-03-31 DIAGNOSIS — D509 Iron deficiency anemia, unspecified: Secondary | ICD-10-CM | POA: Diagnosis not present

## 2016-04-01 DIAGNOSIS — N186 End stage renal disease: Secondary | ICD-10-CM | POA: Diagnosis not present

## 2016-04-01 DIAGNOSIS — N2581 Secondary hyperparathyroidism of renal origin: Secondary | ICD-10-CM | POA: Diagnosis not present

## 2016-04-01 DIAGNOSIS — N2589 Other disorders resulting from impaired renal tubular function: Secondary | ICD-10-CM | POA: Diagnosis not present

## 2016-04-01 DIAGNOSIS — D509 Iron deficiency anemia, unspecified: Secondary | ICD-10-CM | POA: Diagnosis not present

## 2016-04-01 DIAGNOSIS — D630 Anemia in neoplastic disease: Secondary | ICD-10-CM | POA: Diagnosis not present

## 2016-04-02 DIAGNOSIS — D509 Iron deficiency anemia, unspecified: Secondary | ICD-10-CM | POA: Diagnosis not present

## 2016-04-02 DIAGNOSIS — N2581 Secondary hyperparathyroidism of renal origin: Secondary | ICD-10-CM | POA: Diagnosis not present

## 2016-04-02 DIAGNOSIS — N2589 Other disorders resulting from impaired renal tubular function: Secondary | ICD-10-CM | POA: Diagnosis not present

## 2016-04-02 DIAGNOSIS — D630 Anemia in neoplastic disease: Secondary | ICD-10-CM | POA: Diagnosis not present

## 2016-04-02 DIAGNOSIS — N186 End stage renal disease: Secondary | ICD-10-CM | POA: Diagnosis not present

## 2016-04-03 DIAGNOSIS — N2581 Secondary hyperparathyroidism of renal origin: Secondary | ICD-10-CM | POA: Diagnosis not present

## 2016-04-03 DIAGNOSIS — N186 End stage renal disease: Secondary | ICD-10-CM | POA: Diagnosis not present

## 2016-04-03 DIAGNOSIS — D630 Anemia in neoplastic disease: Secondary | ICD-10-CM | POA: Diagnosis not present

## 2016-04-03 DIAGNOSIS — N2589 Other disorders resulting from impaired renal tubular function: Secondary | ICD-10-CM | POA: Diagnosis not present

## 2016-04-03 DIAGNOSIS — D509 Iron deficiency anemia, unspecified: Secondary | ICD-10-CM | POA: Diagnosis not present

## 2016-04-04 DIAGNOSIS — N2581 Secondary hyperparathyroidism of renal origin: Secondary | ICD-10-CM | POA: Diagnosis not present

## 2016-04-04 DIAGNOSIS — N2589 Other disorders resulting from impaired renal tubular function: Secondary | ICD-10-CM | POA: Diagnosis not present

## 2016-04-04 DIAGNOSIS — D630 Anemia in neoplastic disease: Secondary | ICD-10-CM | POA: Diagnosis not present

## 2016-04-04 DIAGNOSIS — N186 End stage renal disease: Secondary | ICD-10-CM | POA: Diagnosis not present

## 2016-04-04 DIAGNOSIS — D509 Iron deficiency anemia, unspecified: Secondary | ICD-10-CM | POA: Diagnosis not present

## 2016-04-05 DIAGNOSIS — N186 End stage renal disease: Secondary | ICD-10-CM | POA: Diagnosis not present

## 2016-04-05 DIAGNOSIS — N2581 Secondary hyperparathyroidism of renal origin: Secondary | ICD-10-CM | POA: Diagnosis not present

## 2016-04-05 DIAGNOSIS — N2589 Other disorders resulting from impaired renal tubular function: Secondary | ICD-10-CM | POA: Diagnosis not present

## 2016-04-05 DIAGNOSIS — D509 Iron deficiency anemia, unspecified: Secondary | ICD-10-CM | POA: Diagnosis not present

## 2016-04-05 DIAGNOSIS — D630 Anemia in neoplastic disease: Secondary | ICD-10-CM | POA: Diagnosis not present

## 2016-04-06 DIAGNOSIS — N2581 Secondary hyperparathyroidism of renal origin: Secondary | ICD-10-CM | POA: Diagnosis not present

## 2016-04-06 DIAGNOSIS — N186 End stage renal disease: Secondary | ICD-10-CM | POA: Diagnosis not present

## 2016-04-06 DIAGNOSIS — D509 Iron deficiency anemia, unspecified: Secondary | ICD-10-CM | POA: Diagnosis not present

## 2016-04-06 DIAGNOSIS — N2589 Other disorders resulting from impaired renal tubular function: Secondary | ICD-10-CM | POA: Diagnosis not present

## 2016-04-06 DIAGNOSIS — D630 Anemia in neoplastic disease: Secondary | ICD-10-CM | POA: Diagnosis not present

## 2016-04-07 DIAGNOSIS — D630 Anemia in neoplastic disease: Secondary | ICD-10-CM | POA: Diagnosis not present

## 2016-04-07 DIAGNOSIS — N2581 Secondary hyperparathyroidism of renal origin: Secondary | ICD-10-CM | POA: Diagnosis not present

## 2016-04-07 DIAGNOSIS — N2589 Other disorders resulting from impaired renal tubular function: Secondary | ICD-10-CM | POA: Diagnosis not present

## 2016-04-07 DIAGNOSIS — N186 End stage renal disease: Secondary | ICD-10-CM | POA: Diagnosis not present

## 2016-04-07 DIAGNOSIS — D509 Iron deficiency anemia, unspecified: Secondary | ICD-10-CM | POA: Diagnosis not present

## 2016-04-08 DIAGNOSIS — N2581 Secondary hyperparathyroidism of renal origin: Secondary | ICD-10-CM | POA: Diagnosis not present

## 2016-04-08 DIAGNOSIS — N186 End stage renal disease: Secondary | ICD-10-CM | POA: Diagnosis not present

## 2016-04-08 DIAGNOSIS — D509 Iron deficiency anemia, unspecified: Secondary | ICD-10-CM | POA: Diagnosis not present

## 2016-04-08 DIAGNOSIS — D630 Anemia in neoplastic disease: Secondary | ICD-10-CM | POA: Diagnosis not present

## 2016-04-08 DIAGNOSIS — N2589 Other disorders resulting from impaired renal tubular function: Secondary | ICD-10-CM | POA: Diagnosis not present

## 2016-04-09 DIAGNOSIS — D509 Iron deficiency anemia, unspecified: Secondary | ICD-10-CM | POA: Diagnosis not present

## 2016-04-09 DIAGNOSIS — N2581 Secondary hyperparathyroidism of renal origin: Secondary | ICD-10-CM | POA: Diagnosis not present

## 2016-04-09 DIAGNOSIS — D630 Anemia in neoplastic disease: Secondary | ICD-10-CM | POA: Diagnosis not present

## 2016-04-09 DIAGNOSIS — N2589 Other disorders resulting from impaired renal tubular function: Secondary | ICD-10-CM | POA: Diagnosis not present

## 2016-04-09 DIAGNOSIS — N186 End stage renal disease: Secondary | ICD-10-CM | POA: Diagnosis not present

## 2016-04-10 DIAGNOSIS — N186 End stage renal disease: Secondary | ICD-10-CM | POA: Diagnosis not present

## 2016-04-10 DIAGNOSIS — D509 Iron deficiency anemia, unspecified: Secondary | ICD-10-CM | POA: Diagnosis not present

## 2016-04-10 DIAGNOSIS — D630 Anemia in neoplastic disease: Secondary | ICD-10-CM | POA: Diagnosis not present

## 2016-04-10 DIAGNOSIS — N2581 Secondary hyperparathyroidism of renal origin: Secondary | ICD-10-CM | POA: Diagnosis not present

## 2016-04-10 DIAGNOSIS — N2589 Other disorders resulting from impaired renal tubular function: Secondary | ICD-10-CM | POA: Diagnosis not present

## 2016-04-11 DIAGNOSIS — D509 Iron deficiency anemia, unspecified: Secondary | ICD-10-CM | POA: Diagnosis not present

## 2016-04-11 DIAGNOSIS — N2589 Other disorders resulting from impaired renal tubular function: Secondary | ICD-10-CM | POA: Diagnosis not present

## 2016-04-11 DIAGNOSIS — D630 Anemia in neoplastic disease: Secondary | ICD-10-CM | POA: Diagnosis not present

## 2016-04-11 DIAGNOSIS — N2581 Secondary hyperparathyroidism of renal origin: Secondary | ICD-10-CM | POA: Diagnosis not present

## 2016-04-11 DIAGNOSIS — N186 End stage renal disease: Secondary | ICD-10-CM | POA: Diagnosis not present

## 2016-04-12 DIAGNOSIS — D509 Iron deficiency anemia, unspecified: Secondary | ICD-10-CM | POA: Diagnosis not present

## 2016-04-12 DIAGNOSIS — D630 Anemia in neoplastic disease: Secondary | ICD-10-CM | POA: Diagnosis not present

## 2016-04-12 DIAGNOSIS — N2581 Secondary hyperparathyroidism of renal origin: Secondary | ICD-10-CM | POA: Diagnosis not present

## 2016-04-12 DIAGNOSIS — N186 End stage renal disease: Secondary | ICD-10-CM | POA: Diagnosis not present

## 2016-04-12 DIAGNOSIS — N2589 Other disorders resulting from impaired renal tubular function: Secondary | ICD-10-CM | POA: Diagnosis not present

## 2016-04-13 DIAGNOSIS — N2589 Other disorders resulting from impaired renal tubular function: Secondary | ICD-10-CM | POA: Diagnosis not present

## 2016-04-13 DIAGNOSIS — D630 Anemia in neoplastic disease: Secondary | ICD-10-CM | POA: Diagnosis not present

## 2016-04-13 DIAGNOSIS — D509 Iron deficiency anemia, unspecified: Secondary | ICD-10-CM | POA: Diagnosis not present

## 2016-04-13 DIAGNOSIS — N2581 Secondary hyperparathyroidism of renal origin: Secondary | ICD-10-CM | POA: Diagnosis not present

## 2016-04-13 DIAGNOSIS — N186 End stage renal disease: Secondary | ICD-10-CM | POA: Diagnosis not present

## 2016-04-13 DIAGNOSIS — Z992 Dependence on renal dialysis: Secondary | ICD-10-CM | POA: Diagnosis not present

## 2016-04-14 DIAGNOSIS — Z79899 Other long term (current) drug therapy: Secondary | ICD-10-CM | POA: Diagnosis not present

## 2016-04-14 DIAGNOSIS — N2581 Secondary hyperparathyroidism of renal origin: Secondary | ICD-10-CM | POA: Diagnosis not present

## 2016-04-14 DIAGNOSIS — Z5189 Encounter for other specified aftercare: Secondary | ICD-10-CM | POA: Diagnosis not present

## 2016-04-14 DIAGNOSIS — D509 Iron deficiency anemia, unspecified: Secondary | ICD-10-CM | POA: Diagnosis not present

## 2016-04-14 DIAGNOSIS — D630 Anemia in neoplastic disease: Secondary | ICD-10-CM | POA: Diagnosis not present

## 2016-04-14 DIAGNOSIS — N186 End stage renal disease: Secondary | ICD-10-CM | POA: Diagnosis not present

## 2016-04-14 DIAGNOSIS — N2589 Other disorders resulting from impaired renal tubular function: Secondary | ICD-10-CM | POA: Diagnosis not present

## 2016-04-14 DIAGNOSIS — E440 Moderate protein-calorie malnutrition: Secondary | ICD-10-CM | POA: Diagnosis not present

## 2016-04-14 DIAGNOSIS — D631 Anemia in chronic kidney disease: Secondary | ICD-10-CM | POA: Diagnosis not present

## 2016-04-15 DIAGNOSIS — N2581 Secondary hyperparathyroidism of renal origin: Secondary | ICD-10-CM | POA: Diagnosis not present

## 2016-04-15 DIAGNOSIS — N2589 Other disorders resulting from impaired renal tubular function: Secondary | ICD-10-CM | POA: Diagnosis not present

## 2016-04-15 DIAGNOSIS — N186 End stage renal disease: Secondary | ICD-10-CM | POA: Diagnosis not present

## 2016-04-15 DIAGNOSIS — Z79899 Other long term (current) drug therapy: Secondary | ICD-10-CM | POA: Diagnosis not present

## 2016-04-15 DIAGNOSIS — D631 Anemia in chronic kidney disease: Secondary | ICD-10-CM | POA: Diagnosis not present

## 2016-04-15 DIAGNOSIS — D509 Iron deficiency anemia, unspecified: Secondary | ICD-10-CM | POA: Diagnosis not present

## 2016-04-16 DIAGNOSIS — D631 Anemia in chronic kidney disease: Secondary | ICD-10-CM | POA: Diagnosis not present

## 2016-04-16 DIAGNOSIS — Z79899 Other long term (current) drug therapy: Secondary | ICD-10-CM | POA: Diagnosis not present

## 2016-04-16 DIAGNOSIS — N186 End stage renal disease: Secondary | ICD-10-CM | POA: Diagnosis not present

## 2016-04-16 DIAGNOSIS — N2581 Secondary hyperparathyroidism of renal origin: Secondary | ICD-10-CM | POA: Diagnosis not present

## 2016-04-16 DIAGNOSIS — D509 Iron deficiency anemia, unspecified: Secondary | ICD-10-CM | POA: Diagnosis not present

## 2016-04-16 DIAGNOSIS — N2589 Other disorders resulting from impaired renal tubular function: Secondary | ICD-10-CM | POA: Diagnosis not present

## 2016-04-17 DIAGNOSIS — Z79899 Other long term (current) drug therapy: Secondary | ICD-10-CM | POA: Diagnosis not present

## 2016-04-17 DIAGNOSIS — D631 Anemia in chronic kidney disease: Secondary | ICD-10-CM | POA: Diagnosis not present

## 2016-04-17 DIAGNOSIS — N2589 Other disorders resulting from impaired renal tubular function: Secondary | ICD-10-CM | POA: Diagnosis not present

## 2016-04-17 DIAGNOSIS — N186 End stage renal disease: Secondary | ICD-10-CM | POA: Diagnosis not present

## 2016-04-17 DIAGNOSIS — D509 Iron deficiency anemia, unspecified: Secondary | ICD-10-CM | POA: Diagnosis not present

## 2016-04-17 DIAGNOSIS — N2581 Secondary hyperparathyroidism of renal origin: Secondary | ICD-10-CM | POA: Diagnosis not present

## 2016-04-18 DIAGNOSIS — N2581 Secondary hyperparathyroidism of renal origin: Secondary | ICD-10-CM | POA: Diagnosis not present

## 2016-04-18 DIAGNOSIS — Z79899 Other long term (current) drug therapy: Secondary | ICD-10-CM | POA: Diagnosis not present

## 2016-04-18 DIAGNOSIS — D631 Anemia in chronic kidney disease: Secondary | ICD-10-CM | POA: Diagnosis not present

## 2016-04-18 DIAGNOSIS — N186 End stage renal disease: Secondary | ICD-10-CM | POA: Diagnosis not present

## 2016-04-18 DIAGNOSIS — N2589 Other disorders resulting from impaired renal tubular function: Secondary | ICD-10-CM | POA: Diagnosis not present

## 2016-04-18 DIAGNOSIS — D509 Iron deficiency anemia, unspecified: Secondary | ICD-10-CM | POA: Diagnosis not present

## 2016-04-19 DIAGNOSIS — N186 End stage renal disease: Secondary | ICD-10-CM | POA: Diagnosis not present

## 2016-04-19 DIAGNOSIS — D631 Anemia in chronic kidney disease: Secondary | ICD-10-CM | POA: Diagnosis not present

## 2016-04-19 DIAGNOSIS — D509 Iron deficiency anemia, unspecified: Secondary | ICD-10-CM | POA: Diagnosis not present

## 2016-04-19 DIAGNOSIS — Z79899 Other long term (current) drug therapy: Secondary | ICD-10-CM | POA: Diagnosis not present

## 2016-04-19 DIAGNOSIS — N2589 Other disorders resulting from impaired renal tubular function: Secondary | ICD-10-CM | POA: Diagnosis not present

## 2016-04-19 DIAGNOSIS — N2581 Secondary hyperparathyroidism of renal origin: Secondary | ICD-10-CM | POA: Diagnosis not present

## 2016-04-20 DIAGNOSIS — N2589 Other disorders resulting from impaired renal tubular function: Secondary | ICD-10-CM | POA: Diagnosis not present

## 2016-04-20 DIAGNOSIS — Z79899 Other long term (current) drug therapy: Secondary | ICD-10-CM | POA: Diagnosis not present

## 2016-04-20 DIAGNOSIS — N186 End stage renal disease: Secondary | ICD-10-CM | POA: Diagnosis not present

## 2016-04-20 DIAGNOSIS — D631 Anemia in chronic kidney disease: Secondary | ICD-10-CM | POA: Diagnosis not present

## 2016-04-20 DIAGNOSIS — D509 Iron deficiency anemia, unspecified: Secondary | ICD-10-CM | POA: Diagnosis not present

## 2016-04-20 DIAGNOSIS — N2581 Secondary hyperparathyroidism of renal origin: Secondary | ICD-10-CM | POA: Diagnosis not present

## 2016-04-21 DIAGNOSIS — D631 Anemia in chronic kidney disease: Secondary | ICD-10-CM | POA: Diagnosis not present

## 2016-04-21 DIAGNOSIS — Z79899 Other long term (current) drug therapy: Secondary | ICD-10-CM | POA: Diagnosis not present

## 2016-04-21 DIAGNOSIS — D509 Iron deficiency anemia, unspecified: Secondary | ICD-10-CM | POA: Diagnosis not present

## 2016-04-21 DIAGNOSIS — N2589 Other disorders resulting from impaired renal tubular function: Secondary | ICD-10-CM | POA: Diagnosis not present

## 2016-04-21 DIAGNOSIS — N2581 Secondary hyperparathyroidism of renal origin: Secondary | ICD-10-CM | POA: Diagnosis not present

## 2016-04-21 DIAGNOSIS — N186 End stage renal disease: Secondary | ICD-10-CM | POA: Diagnosis not present

## 2016-04-22 DIAGNOSIS — D631 Anemia in chronic kidney disease: Secondary | ICD-10-CM | POA: Diagnosis not present

## 2016-04-22 DIAGNOSIS — Z79899 Other long term (current) drug therapy: Secondary | ICD-10-CM | POA: Diagnosis not present

## 2016-04-22 DIAGNOSIS — N2581 Secondary hyperparathyroidism of renal origin: Secondary | ICD-10-CM | POA: Diagnosis not present

## 2016-04-22 DIAGNOSIS — N2589 Other disorders resulting from impaired renal tubular function: Secondary | ICD-10-CM | POA: Diagnosis not present

## 2016-04-22 DIAGNOSIS — N186 End stage renal disease: Secondary | ICD-10-CM | POA: Diagnosis not present

## 2016-04-22 DIAGNOSIS — D509 Iron deficiency anemia, unspecified: Secondary | ICD-10-CM | POA: Diagnosis not present

## 2016-04-23 DIAGNOSIS — Z79899 Other long term (current) drug therapy: Secondary | ICD-10-CM | POA: Diagnosis not present

## 2016-04-23 DIAGNOSIS — N186 End stage renal disease: Secondary | ICD-10-CM | POA: Diagnosis not present

## 2016-04-23 DIAGNOSIS — D509 Iron deficiency anemia, unspecified: Secondary | ICD-10-CM | POA: Diagnosis not present

## 2016-04-23 DIAGNOSIS — N2581 Secondary hyperparathyroidism of renal origin: Secondary | ICD-10-CM | POA: Diagnosis not present

## 2016-04-23 DIAGNOSIS — R8299 Other abnormal findings in urine: Secondary | ICD-10-CM | POA: Diagnosis not present

## 2016-04-23 DIAGNOSIS — D631 Anemia in chronic kidney disease: Secondary | ICD-10-CM | POA: Diagnosis not present

## 2016-04-23 DIAGNOSIS — N2589 Other disorders resulting from impaired renal tubular function: Secondary | ICD-10-CM | POA: Diagnosis not present

## 2016-04-24 DIAGNOSIS — N2589 Other disorders resulting from impaired renal tubular function: Secondary | ICD-10-CM | POA: Diagnosis not present

## 2016-04-24 DIAGNOSIS — N186 End stage renal disease: Secondary | ICD-10-CM | POA: Diagnosis not present

## 2016-04-24 DIAGNOSIS — D631 Anemia in chronic kidney disease: Secondary | ICD-10-CM | POA: Diagnosis not present

## 2016-04-24 DIAGNOSIS — Z79899 Other long term (current) drug therapy: Secondary | ICD-10-CM | POA: Diagnosis not present

## 2016-04-24 DIAGNOSIS — D509 Iron deficiency anemia, unspecified: Secondary | ICD-10-CM | POA: Diagnosis not present

## 2016-04-24 DIAGNOSIS — N2581 Secondary hyperparathyroidism of renal origin: Secondary | ICD-10-CM | POA: Diagnosis not present

## 2016-04-25 DIAGNOSIS — N186 End stage renal disease: Secondary | ICD-10-CM | POA: Diagnosis not present

## 2016-04-25 DIAGNOSIS — N2581 Secondary hyperparathyroidism of renal origin: Secondary | ICD-10-CM | POA: Diagnosis not present

## 2016-04-25 DIAGNOSIS — D509 Iron deficiency anemia, unspecified: Secondary | ICD-10-CM | POA: Diagnosis not present

## 2016-04-25 DIAGNOSIS — D631 Anemia in chronic kidney disease: Secondary | ICD-10-CM | POA: Diagnosis not present

## 2016-04-25 DIAGNOSIS — N2589 Other disorders resulting from impaired renal tubular function: Secondary | ICD-10-CM | POA: Diagnosis not present

## 2016-04-25 DIAGNOSIS — Z79899 Other long term (current) drug therapy: Secondary | ICD-10-CM | POA: Diagnosis not present

## 2016-04-26 DIAGNOSIS — D509 Iron deficiency anemia, unspecified: Secondary | ICD-10-CM | POA: Diagnosis not present

## 2016-04-26 DIAGNOSIS — N2589 Other disorders resulting from impaired renal tubular function: Secondary | ICD-10-CM | POA: Diagnosis not present

## 2016-04-26 DIAGNOSIS — Z79899 Other long term (current) drug therapy: Secondary | ICD-10-CM | POA: Diagnosis not present

## 2016-04-26 DIAGNOSIS — N186 End stage renal disease: Secondary | ICD-10-CM | POA: Diagnosis not present

## 2016-04-26 DIAGNOSIS — N2581 Secondary hyperparathyroidism of renal origin: Secondary | ICD-10-CM | POA: Diagnosis not present

## 2016-04-26 DIAGNOSIS — D631 Anemia in chronic kidney disease: Secondary | ICD-10-CM | POA: Diagnosis not present

## 2016-04-27 DIAGNOSIS — N2581 Secondary hyperparathyroidism of renal origin: Secondary | ICD-10-CM | POA: Diagnosis not present

## 2016-04-27 DIAGNOSIS — D509 Iron deficiency anemia, unspecified: Secondary | ICD-10-CM | POA: Diagnosis not present

## 2016-04-27 DIAGNOSIS — N186 End stage renal disease: Secondary | ICD-10-CM | POA: Diagnosis not present

## 2016-04-27 DIAGNOSIS — Z79899 Other long term (current) drug therapy: Secondary | ICD-10-CM | POA: Diagnosis not present

## 2016-04-27 DIAGNOSIS — N2589 Other disorders resulting from impaired renal tubular function: Secondary | ICD-10-CM | POA: Diagnosis not present

## 2016-04-27 DIAGNOSIS — D631 Anemia in chronic kidney disease: Secondary | ICD-10-CM | POA: Diagnosis not present

## 2016-04-28 DIAGNOSIS — N186 End stage renal disease: Secondary | ICD-10-CM | POA: Diagnosis not present

## 2016-04-28 DIAGNOSIS — Z79899 Other long term (current) drug therapy: Secondary | ICD-10-CM | POA: Diagnosis not present

## 2016-04-28 DIAGNOSIS — N2581 Secondary hyperparathyroidism of renal origin: Secondary | ICD-10-CM | POA: Diagnosis not present

## 2016-04-28 DIAGNOSIS — D509 Iron deficiency anemia, unspecified: Secondary | ICD-10-CM | POA: Diagnosis not present

## 2016-04-28 DIAGNOSIS — D631 Anemia in chronic kidney disease: Secondary | ICD-10-CM | POA: Diagnosis not present

## 2016-04-28 DIAGNOSIS — N2589 Other disorders resulting from impaired renal tubular function: Secondary | ICD-10-CM | POA: Diagnosis not present

## 2016-04-29 DIAGNOSIS — D509 Iron deficiency anemia, unspecified: Secondary | ICD-10-CM | POA: Diagnosis not present

## 2016-04-29 DIAGNOSIS — N2581 Secondary hyperparathyroidism of renal origin: Secondary | ICD-10-CM | POA: Diagnosis not present

## 2016-04-29 DIAGNOSIS — N2589 Other disorders resulting from impaired renal tubular function: Secondary | ICD-10-CM | POA: Diagnosis not present

## 2016-04-29 DIAGNOSIS — D631 Anemia in chronic kidney disease: Secondary | ICD-10-CM | POA: Diagnosis not present

## 2016-04-29 DIAGNOSIS — N186 End stage renal disease: Secondary | ICD-10-CM | POA: Diagnosis not present

## 2016-04-29 DIAGNOSIS — Z79899 Other long term (current) drug therapy: Secondary | ICD-10-CM | POA: Diagnosis not present

## 2016-04-30 DIAGNOSIS — D509 Iron deficiency anemia, unspecified: Secondary | ICD-10-CM | POA: Diagnosis not present

## 2016-04-30 DIAGNOSIS — N2581 Secondary hyperparathyroidism of renal origin: Secondary | ICD-10-CM | POA: Diagnosis not present

## 2016-04-30 DIAGNOSIS — D631 Anemia in chronic kidney disease: Secondary | ICD-10-CM | POA: Diagnosis not present

## 2016-04-30 DIAGNOSIS — Z79899 Other long term (current) drug therapy: Secondary | ICD-10-CM | POA: Diagnosis not present

## 2016-04-30 DIAGNOSIS — N186 End stage renal disease: Secondary | ICD-10-CM | POA: Diagnosis not present

## 2016-04-30 DIAGNOSIS — N2589 Other disorders resulting from impaired renal tubular function: Secondary | ICD-10-CM | POA: Diagnosis not present

## 2016-05-01 DIAGNOSIS — D509 Iron deficiency anemia, unspecified: Secondary | ICD-10-CM | POA: Diagnosis not present

## 2016-05-01 DIAGNOSIS — Z79899 Other long term (current) drug therapy: Secondary | ICD-10-CM | POA: Diagnosis not present

## 2016-05-01 DIAGNOSIS — D631 Anemia in chronic kidney disease: Secondary | ICD-10-CM | POA: Diagnosis not present

## 2016-05-01 DIAGNOSIS — N2589 Other disorders resulting from impaired renal tubular function: Secondary | ICD-10-CM | POA: Diagnosis not present

## 2016-05-01 DIAGNOSIS — N2581 Secondary hyperparathyroidism of renal origin: Secondary | ICD-10-CM | POA: Diagnosis not present

## 2016-05-01 DIAGNOSIS — N186 End stage renal disease: Secondary | ICD-10-CM | POA: Diagnosis not present

## 2016-05-02 DIAGNOSIS — N2589 Other disorders resulting from impaired renal tubular function: Secondary | ICD-10-CM | POA: Diagnosis not present

## 2016-05-02 DIAGNOSIS — N2581 Secondary hyperparathyroidism of renal origin: Secondary | ICD-10-CM | POA: Diagnosis not present

## 2016-05-02 DIAGNOSIS — Z79899 Other long term (current) drug therapy: Secondary | ICD-10-CM | POA: Diagnosis not present

## 2016-05-02 DIAGNOSIS — N186 End stage renal disease: Secondary | ICD-10-CM | POA: Diagnosis not present

## 2016-05-02 DIAGNOSIS — D631 Anemia in chronic kidney disease: Secondary | ICD-10-CM | POA: Diagnosis not present

## 2016-05-02 DIAGNOSIS — D509 Iron deficiency anemia, unspecified: Secondary | ICD-10-CM | POA: Diagnosis not present

## 2016-05-03 DIAGNOSIS — D631 Anemia in chronic kidney disease: Secondary | ICD-10-CM | POA: Diagnosis not present

## 2016-05-03 DIAGNOSIS — N186 End stage renal disease: Secondary | ICD-10-CM | POA: Diagnosis not present

## 2016-05-03 DIAGNOSIS — N2581 Secondary hyperparathyroidism of renal origin: Secondary | ICD-10-CM | POA: Diagnosis not present

## 2016-05-03 DIAGNOSIS — Z79899 Other long term (current) drug therapy: Secondary | ICD-10-CM | POA: Diagnosis not present

## 2016-05-03 DIAGNOSIS — N2589 Other disorders resulting from impaired renal tubular function: Secondary | ICD-10-CM | POA: Diagnosis not present

## 2016-05-03 DIAGNOSIS — D509 Iron deficiency anemia, unspecified: Secondary | ICD-10-CM | POA: Diagnosis not present

## 2016-05-04 DIAGNOSIS — Z79899 Other long term (current) drug therapy: Secondary | ICD-10-CM | POA: Diagnosis not present

## 2016-05-04 DIAGNOSIS — N2581 Secondary hyperparathyroidism of renal origin: Secondary | ICD-10-CM | POA: Diagnosis not present

## 2016-05-04 DIAGNOSIS — N186 End stage renal disease: Secondary | ICD-10-CM | POA: Diagnosis not present

## 2016-05-04 DIAGNOSIS — D631 Anemia in chronic kidney disease: Secondary | ICD-10-CM | POA: Diagnosis not present

## 2016-05-04 DIAGNOSIS — N2589 Other disorders resulting from impaired renal tubular function: Secondary | ICD-10-CM | POA: Diagnosis not present

## 2016-05-04 DIAGNOSIS — D509 Iron deficiency anemia, unspecified: Secondary | ICD-10-CM | POA: Diagnosis not present

## 2016-05-05 DIAGNOSIS — D631 Anemia in chronic kidney disease: Secondary | ICD-10-CM | POA: Diagnosis not present

## 2016-05-05 DIAGNOSIS — N2581 Secondary hyperparathyroidism of renal origin: Secondary | ICD-10-CM | POA: Diagnosis not present

## 2016-05-05 DIAGNOSIS — N186 End stage renal disease: Secondary | ICD-10-CM | POA: Diagnosis not present

## 2016-05-05 DIAGNOSIS — Z79899 Other long term (current) drug therapy: Secondary | ICD-10-CM | POA: Diagnosis not present

## 2016-05-05 DIAGNOSIS — N2589 Other disorders resulting from impaired renal tubular function: Secondary | ICD-10-CM | POA: Diagnosis not present

## 2016-05-05 DIAGNOSIS — D509 Iron deficiency anemia, unspecified: Secondary | ICD-10-CM | POA: Diagnosis not present

## 2016-05-06 DIAGNOSIS — D631 Anemia in chronic kidney disease: Secondary | ICD-10-CM | POA: Diagnosis not present

## 2016-05-06 DIAGNOSIS — N186 End stage renal disease: Secondary | ICD-10-CM | POA: Diagnosis not present

## 2016-05-06 DIAGNOSIS — N2589 Other disorders resulting from impaired renal tubular function: Secondary | ICD-10-CM | POA: Diagnosis not present

## 2016-05-06 DIAGNOSIS — Z79899 Other long term (current) drug therapy: Secondary | ICD-10-CM | POA: Diagnosis not present

## 2016-05-06 DIAGNOSIS — D509 Iron deficiency anemia, unspecified: Secondary | ICD-10-CM | POA: Diagnosis not present

## 2016-05-06 DIAGNOSIS — N2581 Secondary hyperparathyroidism of renal origin: Secondary | ICD-10-CM | POA: Diagnosis not present

## 2016-05-07 DIAGNOSIS — N2589 Other disorders resulting from impaired renal tubular function: Secondary | ICD-10-CM | POA: Diagnosis not present

## 2016-05-07 DIAGNOSIS — Z79899 Other long term (current) drug therapy: Secondary | ICD-10-CM | POA: Diagnosis not present

## 2016-05-07 DIAGNOSIS — D631 Anemia in chronic kidney disease: Secondary | ICD-10-CM | POA: Diagnosis not present

## 2016-05-07 DIAGNOSIS — N186 End stage renal disease: Secondary | ICD-10-CM | POA: Diagnosis not present

## 2016-05-07 DIAGNOSIS — D509 Iron deficiency anemia, unspecified: Secondary | ICD-10-CM | POA: Diagnosis not present

## 2016-05-07 DIAGNOSIS — N2581 Secondary hyperparathyroidism of renal origin: Secondary | ICD-10-CM | POA: Diagnosis not present

## 2016-05-08 DIAGNOSIS — N2581 Secondary hyperparathyroidism of renal origin: Secondary | ICD-10-CM | POA: Diagnosis not present

## 2016-05-08 DIAGNOSIS — N2589 Other disorders resulting from impaired renal tubular function: Secondary | ICD-10-CM | POA: Diagnosis not present

## 2016-05-08 DIAGNOSIS — N186 End stage renal disease: Secondary | ICD-10-CM | POA: Diagnosis not present

## 2016-05-08 DIAGNOSIS — Z79899 Other long term (current) drug therapy: Secondary | ICD-10-CM | POA: Diagnosis not present

## 2016-05-08 DIAGNOSIS — D631 Anemia in chronic kidney disease: Secondary | ICD-10-CM | POA: Diagnosis not present

## 2016-05-08 DIAGNOSIS — D509 Iron deficiency anemia, unspecified: Secondary | ICD-10-CM | POA: Diagnosis not present

## 2016-05-09 DIAGNOSIS — N2581 Secondary hyperparathyroidism of renal origin: Secondary | ICD-10-CM | POA: Diagnosis not present

## 2016-05-09 DIAGNOSIS — D509 Iron deficiency anemia, unspecified: Secondary | ICD-10-CM | POA: Diagnosis not present

## 2016-05-09 DIAGNOSIS — Z79899 Other long term (current) drug therapy: Secondary | ICD-10-CM | POA: Diagnosis not present

## 2016-05-09 DIAGNOSIS — N2589 Other disorders resulting from impaired renal tubular function: Secondary | ICD-10-CM | POA: Diagnosis not present

## 2016-05-09 DIAGNOSIS — N186 End stage renal disease: Secondary | ICD-10-CM | POA: Diagnosis not present

## 2016-05-09 DIAGNOSIS — D631 Anemia in chronic kidney disease: Secondary | ICD-10-CM | POA: Diagnosis not present

## 2016-05-10 DIAGNOSIS — N186 End stage renal disease: Secondary | ICD-10-CM | POA: Diagnosis not present

## 2016-05-10 DIAGNOSIS — N2589 Other disorders resulting from impaired renal tubular function: Secondary | ICD-10-CM | POA: Diagnosis not present

## 2016-05-10 DIAGNOSIS — D509 Iron deficiency anemia, unspecified: Secondary | ICD-10-CM | POA: Diagnosis not present

## 2016-05-10 DIAGNOSIS — Z79899 Other long term (current) drug therapy: Secondary | ICD-10-CM | POA: Diagnosis not present

## 2016-05-10 DIAGNOSIS — N2581 Secondary hyperparathyroidism of renal origin: Secondary | ICD-10-CM | POA: Diagnosis not present

## 2016-05-10 DIAGNOSIS — D631 Anemia in chronic kidney disease: Secondary | ICD-10-CM | POA: Diagnosis not present

## 2016-05-11 DIAGNOSIS — D631 Anemia in chronic kidney disease: Secondary | ICD-10-CM | POA: Diagnosis not present

## 2016-05-11 DIAGNOSIS — Z79899 Other long term (current) drug therapy: Secondary | ICD-10-CM | POA: Diagnosis not present

## 2016-05-11 DIAGNOSIS — N186 End stage renal disease: Secondary | ICD-10-CM | POA: Diagnosis not present

## 2016-05-11 DIAGNOSIS — N2589 Other disorders resulting from impaired renal tubular function: Secondary | ICD-10-CM | POA: Diagnosis not present

## 2016-05-11 DIAGNOSIS — D509 Iron deficiency anemia, unspecified: Secondary | ICD-10-CM | POA: Diagnosis not present

## 2016-05-11 DIAGNOSIS — N2581 Secondary hyperparathyroidism of renal origin: Secondary | ICD-10-CM | POA: Diagnosis not present

## 2016-05-12 DIAGNOSIS — N2581 Secondary hyperparathyroidism of renal origin: Secondary | ICD-10-CM | POA: Diagnosis not present

## 2016-05-12 DIAGNOSIS — D631 Anemia in chronic kidney disease: Secondary | ICD-10-CM | POA: Diagnosis not present

## 2016-05-12 DIAGNOSIS — Z79899 Other long term (current) drug therapy: Secondary | ICD-10-CM | POA: Diagnosis not present

## 2016-05-12 DIAGNOSIS — N186 End stage renal disease: Secondary | ICD-10-CM | POA: Diagnosis not present

## 2016-05-12 DIAGNOSIS — N2589 Other disorders resulting from impaired renal tubular function: Secondary | ICD-10-CM | POA: Diagnosis not present

## 2016-05-12 DIAGNOSIS — D509 Iron deficiency anemia, unspecified: Secondary | ICD-10-CM | POA: Diagnosis not present

## 2016-05-13 DIAGNOSIS — Z79899 Other long term (current) drug therapy: Secondary | ICD-10-CM | POA: Diagnosis not present

## 2016-05-13 DIAGNOSIS — Z992 Dependence on renal dialysis: Secondary | ICD-10-CM | POA: Diagnosis not present

## 2016-05-13 DIAGNOSIS — N186 End stage renal disease: Secondary | ICD-10-CM | POA: Diagnosis not present

## 2016-05-13 DIAGNOSIS — N2589 Other disorders resulting from impaired renal tubular function: Secondary | ICD-10-CM | POA: Diagnosis not present

## 2016-05-13 DIAGNOSIS — N2581 Secondary hyperparathyroidism of renal origin: Secondary | ICD-10-CM | POA: Diagnosis not present

## 2016-05-13 DIAGNOSIS — D631 Anemia in chronic kidney disease: Secondary | ICD-10-CM | POA: Diagnosis not present

## 2016-05-13 DIAGNOSIS — D509 Iron deficiency anemia, unspecified: Secondary | ICD-10-CM | POA: Diagnosis not present

## 2016-05-14 DIAGNOSIS — N186 End stage renal disease: Secondary | ICD-10-CM | POA: Diagnosis not present

## 2016-05-14 DIAGNOSIS — N2581 Secondary hyperparathyroidism of renal origin: Secondary | ICD-10-CM | POA: Diagnosis not present

## 2016-05-14 DIAGNOSIS — Z79899 Other long term (current) drug therapy: Secondary | ICD-10-CM | POA: Diagnosis not present

## 2016-05-14 DIAGNOSIS — D630 Anemia in neoplastic disease: Secondary | ICD-10-CM | POA: Diagnosis not present

## 2016-05-14 DIAGNOSIS — N2589 Other disorders resulting from impaired renal tubular function: Secondary | ICD-10-CM | POA: Diagnosis not present

## 2016-05-14 DIAGNOSIS — D509 Iron deficiency anemia, unspecified: Secondary | ICD-10-CM | POA: Diagnosis not present

## 2016-05-14 DIAGNOSIS — E440 Moderate protein-calorie malnutrition: Secondary | ICD-10-CM | POA: Diagnosis not present

## 2016-05-14 DIAGNOSIS — Z5189 Encounter for other specified aftercare: Secondary | ICD-10-CM | POA: Diagnosis not present

## 2016-05-15 DIAGNOSIS — Z5189 Encounter for other specified aftercare: Secondary | ICD-10-CM | POA: Diagnosis not present

## 2016-05-15 DIAGNOSIS — N2589 Other disorders resulting from impaired renal tubular function: Secondary | ICD-10-CM | POA: Diagnosis not present

## 2016-05-15 DIAGNOSIS — N186 End stage renal disease: Secondary | ICD-10-CM | POA: Diagnosis not present

## 2016-05-15 DIAGNOSIS — N2581 Secondary hyperparathyroidism of renal origin: Secondary | ICD-10-CM | POA: Diagnosis not present

## 2016-05-15 DIAGNOSIS — Z79899 Other long term (current) drug therapy: Secondary | ICD-10-CM | POA: Diagnosis not present

## 2016-05-15 DIAGNOSIS — D630 Anemia in neoplastic disease: Secondary | ICD-10-CM | POA: Diagnosis not present

## 2016-05-16 DIAGNOSIS — Z5189 Encounter for other specified aftercare: Secondary | ICD-10-CM | POA: Diagnosis not present

## 2016-05-16 DIAGNOSIS — D630 Anemia in neoplastic disease: Secondary | ICD-10-CM | POA: Diagnosis not present

## 2016-05-16 DIAGNOSIS — N2581 Secondary hyperparathyroidism of renal origin: Secondary | ICD-10-CM | POA: Diagnosis not present

## 2016-05-16 DIAGNOSIS — N2589 Other disorders resulting from impaired renal tubular function: Secondary | ICD-10-CM | POA: Diagnosis not present

## 2016-05-16 DIAGNOSIS — N186 End stage renal disease: Secondary | ICD-10-CM | POA: Diagnosis not present

## 2016-05-16 DIAGNOSIS — Z79899 Other long term (current) drug therapy: Secondary | ICD-10-CM | POA: Diagnosis not present

## 2016-05-17 DIAGNOSIS — N2581 Secondary hyperparathyroidism of renal origin: Secondary | ICD-10-CM | POA: Diagnosis not present

## 2016-05-17 DIAGNOSIS — N2589 Other disorders resulting from impaired renal tubular function: Secondary | ICD-10-CM | POA: Diagnosis not present

## 2016-05-17 DIAGNOSIS — D630 Anemia in neoplastic disease: Secondary | ICD-10-CM | POA: Diagnosis not present

## 2016-05-17 DIAGNOSIS — N186 End stage renal disease: Secondary | ICD-10-CM | POA: Diagnosis not present

## 2016-05-17 DIAGNOSIS — R8299 Other abnormal findings in urine: Secondary | ICD-10-CM | POA: Diagnosis not present

## 2016-05-17 DIAGNOSIS — Z79899 Other long term (current) drug therapy: Secondary | ICD-10-CM | POA: Diagnosis not present

## 2016-05-17 DIAGNOSIS — Z5189 Encounter for other specified aftercare: Secondary | ICD-10-CM | POA: Diagnosis not present

## 2016-05-18 ENCOUNTER — Emergency Department (HOSPITAL_COMMUNITY): Payer: Medicare Other

## 2016-05-18 ENCOUNTER — Encounter (HOSPITAL_COMMUNITY): Payer: Self-pay | Admitting: Emergency Medicine

## 2016-05-18 ENCOUNTER — Emergency Department (HOSPITAL_COMMUNITY)
Admission: EM | Admit: 2016-05-18 | Discharge: 2016-05-18 | Disposition: A | Discharge status: Home / Self Care | Payer: Medicare Other | Attending: Emergency Medicine | Admitting: Emergency Medicine

## 2016-05-18 DIAGNOSIS — N186 End stage renal disease: Secondary | ICD-10-CM | POA: Diagnosis not present

## 2016-05-18 DIAGNOSIS — M436 Torticollis: Secondary | ICD-10-CM | POA: Insufficient documentation

## 2016-05-18 DIAGNOSIS — Z87891 Personal history of nicotine dependence: Secondary | ICD-10-CM | POA: Insufficient documentation

## 2016-05-18 DIAGNOSIS — M542 Cervicalgia: Secondary | ICD-10-CM | POA: Diagnosis present

## 2016-05-18 DIAGNOSIS — I10 Essential (primary) hypertension: Secondary | ICD-10-CM | POA: Insufficient documentation

## 2016-05-18 DIAGNOSIS — Z5189 Encounter for other specified aftercare: Secondary | ICD-10-CM | POA: Diagnosis not present

## 2016-05-18 DIAGNOSIS — N2581 Secondary hyperparathyroidism of renal origin: Secondary | ICD-10-CM | POA: Diagnosis not present

## 2016-05-18 DIAGNOSIS — N2589 Other disorders resulting from impaired renal tubular function: Secondary | ICD-10-CM | POA: Diagnosis not present

## 2016-05-18 DIAGNOSIS — R079 Chest pain, unspecified: Secondary | ICD-10-CM | POA: Diagnosis not present

## 2016-05-18 DIAGNOSIS — D630 Anemia in neoplastic disease: Secondary | ICD-10-CM | POA: Diagnosis not present

## 2016-05-18 DIAGNOSIS — Z79899 Other long term (current) drug therapy: Secondary | ICD-10-CM | POA: Diagnosis not present

## 2016-05-18 LAB — CBC
HEMATOCRIT: 35.8 % — AB (ref 36.0–46.0)
HEMOGLOBIN: 12 g/dL (ref 12.0–15.0)
MCH: 26.6 pg (ref 26.0–34.0)
MCHC: 33.5 g/dL (ref 30.0–36.0)
MCV: 79.4 fL (ref 78.0–100.0)
Platelets: 166 10*3/uL (ref 150–400)
RBC: 4.51 MIL/uL (ref 3.87–5.11)
RDW: 14.1 % (ref 11.5–15.5)
WBC: 7.3 10*3/uL (ref 4.0–10.5)

## 2016-05-18 LAB — BASIC METABOLIC PANEL
ANION GAP: 11 (ref 5–15)
BUN: 52 mg/dL — ABNORMAL HIGH (ref 6–20)
CO2: 19 mmol/L — ABNORMAL LOW (ref 22–32)
Calcium: 9.8 mg/dL (ref 8.9–10.3)
Chloride: 110 mmol/L (ref 101–111)
Creatinine, Ser: 11.24 mg/dL — ABNORMAL HIGH (ref 0.44–1.00)
GFR calc Af Amer: 5 mL/min — ABNORMAL LOW (ref >60)
GFR, EST NON AFRICAN AMERICAN: 4 mL/min — AB (ref >60)
GLUCOSE: 85 mg/dL (ref 65–99)
POTASSIUM: 4.8 mmol/L (ref 3.5–5.1)
Sodium: 140 mmol/L (ref 135–145)

## 2016-05-18 LAB — I-STAT TROPONIN, ED: Troponin i, poc: 0 ng/mL (ref 0.00–0.08)

## 2016-05-18 MED ORDER — NAPROXEN 375 MG PO TABS: 375.0000 mg | ORAL_TABLET | Freq: Two times a day (BID) | ORAL | 0 refills | Status: DC

## 2016-05-18 MED ORDER — CYCLOBENZAPRINE HCL 5 MG PO TABS: 5.0000 mg | ORAL_TABLET | Freq: Three times a day (TID) | ORAL | 0 refills | Status: DC | PRN

## 2016-05-18 NOTE — ED Provider Notes (Addendum)
[facility] DEPT Provider Note   CSN: [id number] Arrival date & time: 05/18/16  0815     History   Chief Complaint Chief Complaint  Patient presents with  . Chest Pain  . Shoulder Pain    HPI Meghan Richardson is a 36 y.o. female.  HPI Patient awakened in the morning 2 days ago with the right side of her neck stiff and unable to move it. She reports it's very painful to turn it towards the right. She has pain that on the side of her neck and into her shoulder and shoulder blade on the right side. She did not try any home remedies. After 2 days when it did not resolve she became concerned and thought she should seek treatment. No associated shortness of breath, fever, cough. Patient's medical history is for ESRD on home peritoneal dialysis. Past Medical History:  Diagnosis Date  . FSGS (focal segmental glomerulosclerosis)    Reports Kidney Bx at age 5  . Hypertension     Patient Active Problem List   Diagnosis Date Noted  . Peritonitis associated with peritoneal dialysis ([place]) 10/03/2014  . Hypertension due to end stage renal disease on dialysis ([place]) 10/03/2014  . Anemia in chronic kidney disease (CKD) 10/03/2014    History reviewed. No pertinent surgical history.  OB History    No data available       Home Medications    Prior to Admission medications   Medication Sig Start Date End Date Taking? Authorizing Provider  cyclobenzaprine (FLEXERIL) 5 MG tablet Take 1 tablet (5 mg total) by mouth 3 (three) times daily as needed for muscle spasms. 05/18/16   Charlesetta Shanks, MD  HYDROcodone-acetaminophen (NORCO/VICODIN) 5-325 MG per tablet Take 1 tablet by mouth every 6 (six) hours as needed. 07/22/14   Varney Biles, MD  lisinopril (PRINIVIL,ZESTRIL) 10 MG tablet Take 10 mg by mouth 2 (two) times daily.    [provider]  naproxen (NAPROSYN) 375 MG tablet Take 1 tablet (375 mg total) by mouth 2 (two) times daily. 05/18/16   Charlesetta Shanks, MD  senna (SENOKOT) 8.6 MG  tablet Take 1 tablet by mouth daily as needed for constipation.    [provider]    Family History Family History  Problem Relation Age of Onset  . Kidney disease Brother     FSGS    Social History Social History  Substance Use Topics  . Smoking status: Former Research scientist (life sciences)  . Smokeless tobacco: Not on file  . Alcohol use No     Allergies   Patient has no known allergies.   Review of Systems Review of Systems 10 Systems reviewed and are negative for acute change except as noted in the HPI.   Physical Exam Updated Vital Signs BP (!) 146/97   Pulse 61   Temp 98.4 F (36.9 C) (Oral)   Resp 18   LMP  (LMP Unknown)   SpO2 98%   Physical Exam  Constitutional: She is oriented to person, place, and time. She appears well-developed and well-nourished. No distress.  HENT:  Head: Normocephalic and atraumatic.  Eyes: EOM are normal.  Neck:  Spasm of the paracervical muscles and trapezius on the top of the shoulder on the right. Exquisitely tender to palpation. Pain reproduced by turning head to the right. No cervical lymphadenopathy and no meningismus.  Cardiovascular: Normal rate, regular rhythm, normal heart sounds and intact distal pulses.   Pulmonary/Chest: Effort normal and breath sounds normal.  Musculoskeletal: Normal range of motion.  Calves are soft and nontender. No peripheral edema.  Neurological: She is alert and oriented to person, place, and time. No cranial nerve deficit. She exhibits normal muscle tone. Coordination normal.  Skin: Skin is warm and dry.  Psychiatric: She has a normal mood and affect.     ED Treatments / Results  Labs (all labs ordered are listed, but only abnormal results are displayed) Labs Reviewed  BASIC METABOLIC PANEL - Abnormal; Notable for the following:       Result Value   CO2 19 (*)    BUN 52 (*)    Creatinine, Ser 11.24 (*)    GFR calc non Af Amer 4 (*)    GFR calc Af Amer 5 (*)    All other components within normal  limits  CBC - Abnormal; Notable for the following:    HCT 35.8 (*)    All other components within normal limits  I-STAT TROPOININ, ED    EKG  EKG Interpretation  Date/Time:  Saturday May 18 2016 08:21:24 EDT Ventricular Rate:  83 PR Interval:  150 QRS Duration: 90 QT Interval:  370 QTC Calculation: 434 R Axis:   74 Text Interpretation:  Normal sinus rhythm Normal ECG agree. no change from previous Confirmed by Johnney Killian, MD, Jeannie Done [phone]) on 05/18/2016 9:45:26 AM       Radiology Dg Chest 2 View  Result Date: 05/18/2016 CLINICAL DATA:  Right chest and shoulder pain, hypertension EXAM: CHEST  2 VIEW COMPARISON:  None available FINDINGS: The heart size and mediastinal contours are within normal limits. Both lungs are clear. The visualized skeletal structures are unremarkable. IMPRESSION: No active cardiopulmonary disease. Electronically Signed   By: Jerilynn Mages.  Shick M.D.   On: 05/18/2016 09:11    Procedures Procedures (including critical care time)  Medications Ordered in ED Medications - No data to display   Initial Impression / Assessment and Plan / ED Course  I have reviewed the triage vital signs and the nursing notes.  Pertinent labs & imaging results that were available during my care of the patient were reviewed by me and considered in my medical decision making (see chart for details).      Final Clinical Impressions(s) / ED Diagnoses   Final diagnoses:  Torticollis   Patient is alert and appropriate with no signs distress. No fevers no chest pain no cough. Physical exam and history are consistent with muscular spasm. Patient will be initiated on Flexeril and naproxen. Baseline medical condition of ESRD peritoneal dialysis is stable. Patient has no associated complaints. She does not have peripheral edema or dyspnea. New Prescriptions New Prescriptions   CYCLOBENZAPRINE (FLEXERIL) 5 MG TABLET    Take 1 tablet (5 mg total) by mouth 3 (three) times daily as needed for  muscle spasms.   NAPROXEN (NAPROSYN) 375 MG TABLET    Take 1 tablet (375 mg total) by mouth 2 (two) times daily.     Charlesetta Shanks, MD 05/18/16 [place], [place], [place] 05/18/16 [phone]

## 2016-05-18 NOTE — ED Triage Notes (Signed)
Per pt, states she recently started Provera for irregular periods, started having R sided neck and chest pain, worse with movement, pt denies injury. Pt hx of ESRD, with Peritoneal dialysis, does it every night without issue.

## 2016-05-18 NOTE — ED Notes (Signed)
Dr. Pfeiffer at bedside   

## 2016-05-18 NOTE — ED Notes (Signed)
Papers reviewed with patient and she verbalizes understanding and leaving alone today.

## 2016-05-19 DIAGNOSIS — Z5189 Encounter for other specified aftercare: Secondary | ICD-10-CM | POA: Diagnosis not present

## 2016-05-19 DIAGNOSIS — N2589 Other disorders resulting from impaired renal tubular function: Secondary | ICD-10-CM | POA: Diagnosis not present

## 2016-05-19 DIAGNOSIS — N186 End stage renal disease: Secondary | ICD-10-CM | POA: Diagnosis not present

## 2016-05-19 DIAGNOSIS — N2581 Secondary hyperparathyroidism of renal origin: Secondary | ICD-10-CM | POA: Diagnosis not present

## 2016-05-19 DIAGNOSIS — D630 Anemia in neoplastic disease: Secondary | ICD-10-CM | POA: Diagnosis not present

## 2016-05-19 DIAGNOSIS — Z79899 Other long term (current) drug therapy: Secondary | ICD-10-CM | POA: Diagnosis not present

## 2016-05-20 DIAGNOSIS — Z5189 Encounter for other specified aftercare: Secondary | ICD-10-CM | POA: Diagnosis not present

## 2016-05-20 DIAGNOSIS — N186 End stage renal disease: Secondary | ICD-10-CM | POA: Diagnosis not present

## 2016-05-20 DIAGNOSIS — Z79899 Other long term (current) drug therapy: Secondary | ICD-10-CM | POA: Diagnosis not present

## 2016-05-20 DIAGNOSIS — D630 Anemia in neoplastic disease: Secondary | ICD-10-CM | POA: Diagnosis not present

## 2016-05-20 DIAGNOSIS — N2589 Other disorders resulting from impaired renal tubular function: Secondary | ICD-10-CM | POA: Diagnosis not present

## 2016-05-20 DIAGNOSIS — N2581 Secondary hyperparathyroidism of renal origin: Secondary | ICD-10-CM | POA: Diagnosis not present

## 2016-05-21 DIAGNOSIS — N186 End stage renal disease: Secondary | ICD-10-CM | POA: Diagnosis not present

## 2016-05-21 DIAGNOSIS — N2581 Secondary hyperparathyroidism of renal origin: Secondary | ICD-10-CM | POA: Diagnosis not present

## 2016-05-21 DIAGNOSIS — D630 Anemia in neoplastic disease: Secondary | ICD-10-CM | POA: Diagnosis not present

## 2016-05-21 DIAGNOSIS — Z5189 Encounter for other specified aftercare: Secondary | ICD-10-CM | POA: Diagnosis not present

## 2016-05-21 DIAGNOSIS — N2589 Other disorders resulting from impaired renal tubular function: Secondary | ICD-10-CM | POA: Diagnosis not present

## 2016-05-21 DIAGNOSIS — Z79899 Other long term (current) drug therapy: Secondary | ICD-10-CM | POA: Diagnosis not present

## 2016-05-22 DIAGNOSIS — N186 End stage renal disease: Secondary | ICD-10-CM | POA: Diagnosis not present

## 2016-05-22 DIAGNOSIS — D630 Anemia in neoplastic disease: Secondary | ICD-10-CM | POA: Diagnosis not present

## 2016-05-22 DIAGNOSIS — N2581 Secondary hyperparathyroidism of renal origin: Secondary | ICD-10-CM | POA: Diagnosis not present

## 2016-05-22 DIAGNOSIS — N2589 Other disorders resulting from impaired renal tubular function: Secondary | ICD-10-CM | POA: Diagnosis not present

## 2016-05-22 DIAGNOSIS — Z79899 Other long term (current) drug therapy: Secondary | ICD-10-CM | POA: Diagnosis not present

## 2016-05-22 DIAGNOSIS — Z5189 Encounter for other specified aftercare: Secondary | ICD-10-CM | POA: Diagnosis not present

## 2016-05-23 DIAGNOSIS — N2581 Secondary hyperparathyroidism of renal origin: Secondary | ICD-10-CM | POA: Diagnosis not present

## 2016-05-23 DIAGNOSIS — N186 End stage renal disease: Secondary | ICD-10-CM | POA: Diagnosis not present

## 2016-05-23 DIAGNOSIS — Z5189 Encounter for other specified aftercare: Secondary | ICD-10-CM | POA: Diagnosis not present

## 2016-05-23 DIAGNOSIS — N2589 Other disorders resulting from impaired renal tubular function: Secondary | ICD-10-CM | POA: Diagnosis not present

## 2016-05-23 DIAGNOSIS — Z79899 Other long term (current) drug therapy: Secondary | ICD-10-CM | POA: Diagnosis not present

## 2016-05-23 DIAGNOSIS — D630 Anemia in neoplastic disease: Secondary | ICD-10-CM | POA: Diagnosis not present

## 2016-05-24 DIAGNOSIS — N2589 Other disorders resulting from impaired renal tubular function: Secondary | ICD-10-CM | POA: Diagnosis not present

## 2016-05-24 DIAGNOSIS — Z5189 Encounter for other specified aftercare: Secondary | ICD-10-CM | POA: Diagnosis not present

## 2016-05-24 DIAGNOSIS — Z79899 Other long term (current) drug therapy: Secondary | ICD-10-CM | POA: Diagnosis not present

## 2016-05-24 DIAGNOSIS — N2581 Secondary hyperparathyroidism of renal origin: Secondary | ICD-10-CM | POA: Diagnosis not present

## 2016-05-24 DIAGNOSIS — D630 Anemia in neoplastic disease: Secondary | ICD-10-CM | POA: Diagnosis not present

## 2016-05-24 DIAGNOSIS — N186 End stage renal disease: Secondary | ICD-10-CM | POA: Diagnosis not present

## 2016-05-25 DIAGNOSIS — Z79899 Other long term (current) drug therapy: Secondary | ICD-10-CM | POA: Diagnosis not present

## 2016-05-25 DIAGNOSIS — D630 Anemia in neoplastic disease: Secondary | ICD-10-CM | POA: Diagnosis not present

## 2016-05-25 DIAGNOSIS — Z5189 Encounter for other specified aftercare: Secondary | ICD-10-CM | POA: Diagnosis not present

## 2016-05-25 DIAGNOSIS — N186 End stage renal disease: Secondary | ICD-10-CM | POA: Diagnosis not present

## 2016-05-25 DIAGNOSIS — N2589 Other disorders resulting from impaired renal tubular function: Secondary | ICD-10-CM | POA: Diagnosis not present

## 2016-05-25 DIAGNOSIS — N2581 Secondary hyperparathyroidism of renal origin: Secondary | ICD-10-CM | POA: Diagnosis not present

## 2016-05-26 DIAGNOSIS — N2581 Secondary hyperparathyroidism of renal origin: Secondary | ICD-10-CM | POA: Diagnosis not present

## 2016-05-26 DIAGNOSIS — Z79899 Other long term (current) drug therapy: Secondary | ICD-10-CM | POA: Diagnosis not present

## 2016-05-26 DIAGNOSIS — N186 End stage renal disease: Secondary | ICD-10-CM | POA: Diagnosis not present

## 2016-05-26 DIAGNOSIS — D630 Anemia in neoplastic disease: Secondary | ICD-10-CM | POA: Diagnosis not present

## 2016-05-26 DIAGNOSIS — Z5189 Encounter for other specified aftercare: Secondary | ICD-10-CM | POA: Diagnosis not present

## 2016-05-26 DIAGNOSIS — N2589 Other disorders resulting from impaired renal tubular function: Secondary | ICD-10-CM | POA: Diagnosis not present

## 2016-05-27 DIAGNOSIS — N2581 Secondary hyperparathyroidism of renal origin: Secondary | ICD-10-CM | POA: Diagnosis not present

## 2016-05-27 DIAGNOSIS — Z79899 Other long term (current) drug therapy: Secondary | ICD-10-CM | POA: Diagnosis not present

## 2016-05-27 DIAGNOSIS — N2589 Other disorders resulting from impaired renal tubular function: Secondary | ICD-10-CM | POA: Diagnosis not present

## 2016-05-27 DIAGNOSIS — N186 End stage renal disease: Secondary | ICD-10-CM | POA: Diagnosis not present

## 2016-05-27 DIAGNOSIS — Z5189 Encounter for other specified aftercare: Secondary | ICD-10-CM | POA: Diagnosis not present

## 2016-05-27 DIAGNOSIS — D630 Anemia in neoplastic disease: Secondary | ICD-10-CM | POA: Diagnosis not present

## 2016-05-28 DIAGNOSIS — N186 End stage renal disease: Secondary | ICD-10-CM | POA: Diagnosis not present

## 2016-05-28 DIAGNOSIS — N2581 Secondary hyperparathyroidism of renal origin: Secondary | ICD-10-CM | POA: Diagnosis not present

## 2016-05-28 DIAGNOSIS — N2589 Other disorders resulting from impaired renal tubular function: Secondary | ICD-10-CM | POA: Diagnosis not present

## 2016-05-28 DIAGNOSIS — Z5189 Encounter for other specified aftercare: Secondary | ICD-10-CM | POA: Diagnosis not present

## 2016-05-28 DIAGNOSIS — D630 Anemia in neoplastic disease: Secondary | ICD-10-CM | POA: Diagnosis not present

## 2016-05-28 DIAGNOSIS — Z79899 Other long term (current) drug therapy: Secondary | ICD-10-CM | POA: Diagnosis not present

## 2016-05-29 DIAGNOSIS — N186 End stage renal disease: Secondary | ICD-10-CM | POA: Diagnosis not present

## 2016-05-29 DIAGNOSIS — Z5189 Encounter for other specified aftercare: Secondary | ICD-10-CM | POA: Diagnosis not present

## 2016-05-29 DIAGNOSIS — Z79899 Other long term (current) drug therapy: Secondary | ICD-10-CM | POA: Diagnosis not present

## 2016-05-29 DIAGNOSIS — N2581 Secondary hyperparathyroidism of renal origin: Secondary | ICD-10-CM | POA: Diagnosis not present

## 2016-05-29 DIAGNOSIS — D630 Anemia in neoplastic disease: Secondary | ICD-10-CM | POA: Diagnosis not present

## 2016-05-29 DIAGNOSIS — N2589 Other disorders resulting from impaired renal tubular function: Secondary | ICD-10-CM | POA: Diagnosis not present

## 2016-05-30 DIAGNOSIS — D630 Anemia in neoplastic disease: Secondary | ICD-10-CM | POA: Diagnosis not present

## 2016-05-30 DIAGNOSIS — Z79899 Other long term (current) drug therapy: Secondary | ICD-10-CM | POA: Diagnosis not present

## 2016-05-30 DIAGNOSIS — N2581 Secondary hyperparathyroidism of renal origin: Secondary | ICD-10-CM | POA: Diagnosis not present

## 2016-05-30 DIAGNOSIS — N186 End stage renal disease: Secondary | ICD-10-CM | POA: Diagnosis not present

## 2016-05-30 DIAGNOSIS — N2589 Other disorders resulting from impaired renal tubular function: Secondary | ICD-10-CM | POA: Diagnosis not present

## 2016-05-30 DIAGNOSIS — Z5189 Encounter for other specified aftercare: Secondary | ICD-10-CM | POA: Diagnosis not present

## 2016-05-31 DIAGNOSIS — Z79899 Other long term (current) drug therapy: Secondary | ICD-10-CM | POA: Diagnosis not present

## 2016-05-31 DIAGNOSIS — N2581 Secondary hyperparathyroidism of renal origin: Secondary | ICD-10-CM | POA: Diagnosis not present

## 2016-05-31 DIAGNOSIS — D630 Anemia in neoplastic disease: Secondary | ICD-10-CM | POA: Diagnosis not present

## 2016-05-31 DIAGNOSIS — Z5189 Encounter for other specified aftercare: Secondary | ICD-10-CM | POA: Diagnosis not present

## 2016-05-31 DIAGNOSIS — N186 End stage renal disease: Secondary | ICD-10-CM | POA: Diagnosis not present

## 2016-05-31 DIAGNOSIS — N2589 Other disorders resulting from impaired renal tubular function: Secondary | ICD-10-CM | POA: Diagnosis not present

## 2016-06-01 DIAGNOSIS — N2581 Secondary hyperparathyroidism of renal origin: Secondary | ICD-10-CM | POA: Diagnosis not present

## 2016-06-01 DIAGNOSIS — Z79899 Other long term (current) drug therapy: Secondary | ICD-10-CM | POA: Diagnosis not present

## 2016-06-01 DIAGNOSIS — N2589 Other disorders resulting from impaired renal tubular function: Secondary | ICD-10-CM | POA: Diagnosis not present

## 2016-06-01 DIAGNOSIS — D630 Anemia in neoplastic disease: Secondary | ICD-10-CM | POA: Diagnosis not present

## 2016-06-01 DIAGNOSIS — Z5189 Encounter for other specified aftercare: Secondary | ICD-10-CM | POA: Diagnosis not present

## 2016-06-01 DIAGNOSIS — N186 End stage renal disease: Secondary | ICD-10-CM | POA: Diagnosis not present

## 2016-06-02 DIAGNOSIS — N2589 Other disorders resulting from impaired renal tubular function: Secondary | ICD-10-CM | POA: Diagnosis not present

## 2016-06-02 DIAGNOSIS — N2581 Secondary hyperparathyroidism of renal origin: Secondary | ICD-10-CM | POA: Diagnosis not present

## 2016-06-02 DIAGNOSIS — Z79899 Other long term (current) drug therapy: Secondary | ICD-10-CM | POA: Diagnosis not present

## 2016-06-02 DIAGNOSIS — D630 Anemia in neoplastic disease: Secondary | ICD-10-CM | POA: Diagnosis not present

## 2016-06-02 DIAGNOSIS — Z5189 Encounter for other specified aftercare: Secondary | ICD-10-CM | POA: Diagnosis not present

## 2016-06-02 DIAGNOSIS — N186 End stage renal disease: Secondary | ICD-10-CM | POA: Diagnosis not present

## 2016-06-03 DIAGNOSIS — N2589 Other disorders resulting from impaired renal tubular function: Secondary | ICD-10-CM | POA: Diagnosis not present

## 2016-06-03 DIAGNOSIS — N2581 Secondary hyperparathyroidism of renal origin: Secondary | ICD-10-CM | POA: Diagnosis not present

## 2016-06-03 DIAGNOSIS — Z79899 Other long term (current) drug therapy: Secondary | ICD-10-CM | POA: Diagnosis not present

## 2016-06-03 DIAGNOSIS — Z5189 Encounter for other specified aftercare: Secondary | ICD-10-CM | POA: Diagnosis not present

## 2016-06-03 DIAGNOSIS — N186 End stage renal disease: Secondary | ICD-10-CM | POA: Diagnosis not present

## 2016-06-03 DIAGNOSIS — D630 Anemia in neoplastic disease: Secondary | ICD-10-CM | POA: Diagnosis not present

## 2016-06-04 DIAGNOSIS — N186 End stage renal disease: Secondary | ICD-10-CM | POA: Diagnosis not present

## 2016-06-04 DIAGNOSIS — N2589 Other disorders resulting from impaired renal tubular function: Secondary | ICD-10-CM | POA: Diagnosis not present

## 2016-06-04 DIAGNOSIS — N2581 Secondary hyperparathyroidism of renal origin: Secondary | ICD-10-CM | POA: Diagnosis not present

## 2016-06-04 DIAGNOSIS — Z5189 Encounter for other specified aftercare: Secondary | ICD-10-CM | POA: Diagnosis not present

## 2016-06-04 DIAGNOSIS — Z79899 Other long term (current) drug therapy: Secondary | ICD-10-CM | POA: Diagnosis not present

## 2016-06-04 DIAGNOSIS — D630 Anemia in neoplastic disease: Secondary | ICD-10-CM | POA: Diagnosis not present

## 2016-06-05 DIAGNOSIS — Z5189 Encounter for other specified aftercare: Secondary | ICD-10-CM | POA: Diagnosis not present

## 2016-06-05 DIAGNOSIS — Z992 Dependence on renal dialysis: Secondary | ICD-10-CM | POA: Diagnosis not present

## 2016-06-05 DIAGNOSIS — Z79899 Other long term (current) drug therapy: Secondary | ICD-10-CM | POA: Diagnosis not present

## 2016-06-05 DIAGNOSIS — N2581 Secondary hyperparathyroidism of renal origin: Secondary | ICD-10-CM | POA: Diagnosis not present

## 2016-06-05 DIAGNOSIS — I871 Compression of vein: Secondary | ICD-10-CM | POA: Diagnosis not present

## 2016-06-05 DIAGNOSIS — T82858A Stenosis of vascular prosthetic devices, implants and grafts, initial encounter: Secondary | ICD-10-CM | POA: Diagnosis not present

## 2016-06-05 DIAGNOSIS — N186 End stage renal disease: Secondary | ICD-10-CM | POA: Diagnosis not present

## 2016-06-05 DIAGNOSIS — D630 Anemia in neoplastic disease: Secondary | ICD-10-CM | POA: Diagnosis not present

## 2016-06-05 DIAGNOSIS — N2589 Other disorders resulting from impaired renal tubular function: Secondary | ICD-10-CM | POA: Diagnosis not present

## 2016-06-06 DIAGNOSIS — Z5189 Encounter for other specified aftercare: Secondary | ICD-10-CM | POA: Diagnosis not present

## 2016-06-06 DIAGNOSIS — D630 Anemia in neoplastic disease: Secondary | ICD-10-CM | POA: Diagnosis not present

## 2016-06-06 DIAGNOSIS — N2581 Secondary hyperparathyroidism of renal origin: Secondary | ICD-10-CM | POA: Diagnosis not present

## 2016-06-06 DIAGNOSIS — Z79899 Other long term (current) drug therapy: Secondary | ICD-10-CM | POA: Diagnosis not present

## 2016-06-06 DIAGNOSIS — N2589 Other disorders resulting from impaired renal tubular function: Secondary | ICD-10-CM | POA: Diagnosis not present

## 2016-06-06 DIAGNOSIS — N186 End stage renal disease: Secondary | ICD-10-CM | POA: Diagnosis not present

## 2016-06-07 DIAGNOSIS — N186 End stage renal disease: Secondary | ICD-10-CM | POA: Diagnosis not present

## 2016-06-07 DIAGNOSIS — N2581 Secondary hyperparathyroidism of renal origin: Secondary | ICD-10-CM | POA: Diagnosis not present

## 2016-06-07 DIAGNOSIS — D630 Anemia in neoplastic disease: Secondary | ICD-10-CM | POA: Diagnosis not present

## 2016-06-07 DIAGNOSIS — Z5189 Encounter for other specified aftercare: Secondary | ICD-10-CM | POA: Diagnosis not present

## 2016-06-07 DIAGNOSIS — Z79899 Other long term (current) drug therapy: Secondary | ICD-10-CM | POA: Diagnosis not present

## 2016-06-07 DIAGNOSIS — N2589 Other disorders resulting from impaired renal tubular function: Secondary | ICD-10-CM | POA: Diagnosis not present

## 2016-06-08 DIAGNOSIS — N186 End stage renal disease: Secondary | ICD-10-CM | POA: Diagnosis not present

## 2016-06-08 DIAGNOSIS — D630 Anemia in neoplastic disease: Secondary | ICD-10-CM | POA: Diagnosis not present

## 2016-06-08 DIAGNOSIS — N2589 Other disorders resulting from impaired renal tubular function: Secondary | ICD-10-CM | POA: Diagnosis not present

## 2016-06-08 DIAGNOSIS — N2581 Secondary hyperparathyroidism of renal origin: Secondary | ICD-10-CM | POA: Diagnosis not present

## 2016-06-08 DIAGNOSIS — Z5189 Encounter for other specified aftercare: Secondary | ICD-10-CM | POA: Diagnosis not present

## 2016-06-08 DIAGNOSIS — Z79899 Other long term (current) drug therapy: Secondary | ICD-10-CM | POA: Diagnosis not present

## 2016-06-09 DIAGNOSIS — Z79899 Other long term (current) drug therapy: Secondary | ICD-10-CM | POA: Diagnosis not present

## 2016-06-09 DIAGNOSIS — Z5189 Encounter for other specified aftercare: Secondary | ICD-10-CM | POA: Diagnosis not present

## 2016-06-09 DIAGNOSIS — D630 Anemia in neoplastic disease: Secondary | ICD-10-CM | POA: Diagnosis not present

## 2016-06-09 DIAGNOSIS — N2589 Other disorders resulting from impaired renal tubular function: Secondary | ICD-10-CM | POA: Diagnosis not present

## 2016-06-09 DIAGNOSIS — N186 End stage renal disease: Secondary | ICD-10-CM | POA: Diagnosis not present

## 2016-06-09 DIAGNOSIS — N2581 Secondary hyperparathyroidism of renal origin: Secondary | ICD-10-CM | POA: Diagnosis not present

## 2016-06-10 DIAGNOSIS — N2589 Other disorders resulting from impaired renal tubular function: Secondary | ICD-10-CM | POA: Diagnosis not present

## 2016-06-10 DIAGNOSIS — D630 Anemia in neoplastic disease: Secondary | ICD-10-CM | POA: Diagnosis not present

## 2016-06-10 DIAGNOSIS — N186 End stage renal disease: Secondary | ICD-10-CM | POA: Diagnosis not present

## 2016-06-10 DIAGNOSIS — Z79899 Other long term (current) drug therapy: Secondary | ICD-10-CM | POA: Diagnosis not present

## 2016-06-10 DIAGNOSIS — N2581 Secondary hyperparathyroidism of renal origin: Secondary | ICD-10-CM | POA: Diagnosis not present

## 2016-06-10 DIAGNOSIS — Z5189 Encounter for other specified aftercare: Secondary | ICD-10-CM | POA: Diagnosis not present

## 2016-06-11 DIAGNOSIS — N186 End stage renal disease: Secondary | ICD-10-CM | POA: Diagnosis not present

## 2016-06-11 DIAGNOSIS — Z5189 Encounter for other specified aftercare: Secondary | ICD-10-CM | POA: Diagnosis not present

## 2016-06-11 DIAGNOSIS — N2581 Secondary hyperparathyroidism of renal origin: Secondary | ICD-10-CM | POA: Diagnosis not present

## 2016-06-11 DIAGNOSIS — Z79899 Other long term (current) drug therapy: Secondary | ICD-10-CM | POA: Diagnosis not present

## 2016-06-11 DIAGNOSIS — D630 Anemia in neoplastic disease: Secondary | ICD-10-CM | POA: Diagnosis not present

## 2016-06-11 DIAGNOSIS — N2589 Other disorders resulting from impaired renal tubular function: Secondary | ICD-10-CM | POA: Diagnosis not present

## 2016-06-12 DIAGNOSIS — N2581 Secondary hyperparathyroidism of renal origin: Secondary | ICD-10-CM | POA: Diagnosis not present

## 2016-06-12 DIAGNOSIS — N2589 Other disorders resulting from impaired renal tubular function: Secondary | ICD-10-CM | POA: Diagnosis not present

## 2016-06-12 DIAGNOSIS — Z5189 Encounter for other specified aftercare: Secondary | ICD-10-CM | POA: Diagnosis not present

## 2016-06-12 DIAGNOSIS — D630 Anemia in neoplastic disease: Secondary | ICD-10-CM | POA: Diagnosis not present

## 2016-06-12 DIAGNOSIS — N186 End stage renal disease: Secondary | ICD-10-CM | POA: Diagnosis not present

## 2016-06-12 DIAGNOSIS — Z79899 Other long term (current) drug therapy: Secondary | ICD-10-CM | POA: Diagnosis not present

## 2016-06-13 DIAGNOSIS — Z992 Dependence on renal dialysis: Secondary | ICD-10-CM | POA: Diagnosis not present

## 2016-06-13 DIAGNOSIS — N186 End stage renal disease: Secondary | ICD-10-CM | POA: Diagnosis not present

## 2016-06-13 DIAGNOSIS — D630 Anemia in neoplastic disease: Secondary | ICD-10-CM | POA: Diagnosis not present

## 2016-06-13 DIAGNOSIS — Z5189 Encounter for other specified aftercare: Secondary | ICD-10-CM | POA: Diagnosis not present

## 2016-06-13 DIAGNOSIS — Z79899 Other long term (current) drug therapy: Secondary | ICD-10-CM | POA: Diagnosis not present

## 2016-06-13 DIAGNOSIS — N2581 Secondary hyperparathyroidism of renal origin: Secondary | ICD-10-CM | POA: Diagnosis not present

## 2016-06-13 DIAGNOSIS — N2589 Other disorders resulting from impaired renal tubular function: Secondary | ICD-10-CM | POA: Diagnosis not present

## 2016-06-14 DIAGNOSIS — N186 End stage renal disease: Secondary | ICD-10-CM | POA: Diagnosis not present

## 2016-06-14 DIAGNOSIS — N2589 Other disorders resulting from impaired renal tubular function: Secondary | ICD-10-CM | POA: Diagnosis not present

## 2016-06-14 DIAGNOSIS — D513 Other dietary vitamin B12 deficiency anemia: Secondary | ICD-10-CM | POA: Diagnosis not present

## 2016-06-14 DIAGNOSIS — N2581 Secondary hyperparathyroidism of renal origin: Secondary | ICD-10-CM | POA: Diagnosis not present

## 2016-06-14 DIAGNOSIS — D509 Iron deficiency anemia, unspecified: Secondary | ICD-10-CM | POA: Diagnosis not present

## 2016-06-14 DIAGNOSIS — K769 Liver disease, unspecified: Secondary | ICD-10-CM | POA: Diagnosis not present

## 2016-06-14 DIAGNOSIS — D630 Anemia in neoplastic disease: Secondary | ICD-10-CM | POA: Diagnosis not present

## 2016-06-15 DIAGNOSIS — N2581 Secondary hyperparathyroidism of renal origin: Secondary | ICD-10-CM | POA: Diagnosis not present

## 2016-06-15 DIAGNOSIS — D630 Anemia in neoplastic disease: Secondary | ICD-10-CM | POA: Diagnosis not present

## 2016-06-15 DIAGNOSIS — D513 Other dietary vitamin B12 deficiency anemia: Secondary | ICD-10-CM | POA: Diagnosis not present

## 2016-06-15 DIAGNOSIS — D509 Iron deficiency anemia, unspecified: Secondary | ICD-10-CM | POA: Diagnosis not present

## 2016-06-15 DIAGNOSIS — N186 End stage renal disease: Secondary | ICD-10-CM | POA: Diagnosis not present

## 2016-06-15 DIAGNOSIS — K769 Liver disease, unspecified: Secondary | ICD-10-CM | POA: Diagnosis not present

## 2016-06-16 DIAGNOSIS — D630 Anemia in neoplastic disease: Secondary | ICD-10-CM | POA: Diagnosis not present

## 2016-06-16 DIAGNOSIS — N186 End stage renal disease: Secondary | ICD-10-CM | POA: Diagnosis not present

## 2016-06-16 DIAGNOSIS — D509 Iron deficiency anemia, unspecified: Secondary | ICD-10-CM | POA: Diagnosis not present

## 2016-06-16 DIAGNOSIS — K769 Liver disease, unspecified: Secondary | ICD-10-CM | POA: Diagnosis not present

## 2016-06-16 DIAGNOSIS — D513 Other dietary vitamin B12 deficiency anemia: Secondary | ICD-10-CM | POA: Diagnosis not present

## 2016-06-16 DIAGNOSIS — N2581 Secondary hyperparathyroidism of renal origin: Secondary | ICD-10-CM | POA: Diagnosis not present

## 2016-06-17 DIAGNOSIS — D509 Iron deficiency anemia, unspecified: Secondary | ICD-10-CM | POA: Diagnosis not present

## 2016-06-17 DIAGNOSIS — D630 Anemia in neoplastic disease: Secondary | ICD-10-CM | POA: Diagnosis not present

## 2016-06-17 DIAGNOSIS — D513 Other dietary vitamin B12 deficiency anemia: Secondary | ICD-10-CM | POA: Diagnosis not present

## 2016-06-17 DIAGNOSIS — N2581 Secondary hyperparathyroidism of renal origin: Secondary | ICD-10-CM | POA: Diagnosis not present

## 2016-06-17 DIAGNOSIS — N186 End stage renal disease: Secondary | ICD-10-CM | POA: Diagnosis not present

## 2016-06-17 DIAGNOSIS — K769 Liver disease, unspecified: Secondary | ICD-10-CM | POA: Diagnosis not present

## 2016-06-18 DIAGNOSIS — D630 Anemia in neoplastic disease: Secondary | ICD-10-CM | POA: Diagnosis not present

## 2016-06-18 DIAGNOSIS — D513 Other dietary vitamin B12 deficiency anemia: Secondary | ICD-10-CM | POA: Diagnosis not present

## 2016-06-18 DIAGNOSIS — N186 End stage renal disease: Secondary | ICD-10-CM | POA: Diagnosis not present

## 2016-06-18 DIAGNOSIS — N2581 Secondary hyperparathyroidism of renal origin: Secondary | ICD-10-CM | POA: Diagnosis not present

## 2016-06-18 DIAGNOSIS — K769 Liver disease, unspecified: Secondary | ICD-10-CM | POA: Diagnosis not present

## 2016-06-18 DIAGNOSIS — D509 Iron deficiency anemia, unspecified: Secondary | ICD-10-CM | POA: Diagnosis not present

## 2016-06-19 DIAGNOSIS — N2581 Secondary hyperparathyroidism of renal origin: Secondary | ICD-10-CM | POA: Diagnosis not present

## 2016-06-19 DIAGNOSIS — K769 Liver disease, unspecified: Secondary | ICD-10-CM | POA: Diagnosis not present

## 2016-06-19 DIAGNOSIS — E784 Other hyperlipidemia: Secondary | ICD-10-CM | POA: Diagnosis not present

## 2016-06-19 DIAGNOSIS — D513 Other dietary vitamin B12 deficiency anemia: Secondary | ICD-10-CM | POA: Diagnosis not present

## 2016-06-19 DIAGNOSIS — D630 Anemia in neoplastic disease: Secondary | ICD-10-CM | POA: Diagnosis not present

## 2016-06-19 DIAGNOSIS — R8299 Other abnormal findings in urine: Secondary | ICD-10-CM | POA: Diagnosis not present

## 2016-06-19 DIAGNOSIS — D509 Iron deficiency anemia, unspecified: Secondary | ICD-10-CM | POA: Diagnosis not present

## 2016-06-19 DIAGNOSIS — N186 End stage renal disease: Secondary | ICD-10-CM | POA: Diagnosis not present

## 2016-06-20 DIAGNOSIS — D513 Other dietary vitamin B12 deficiency anemia: Secondary | ICD-10-CM | POA: Diagnosis not present

## 2016-06-20 DIAGNOSIS — K769 Liver disease, unspecified: Secondary | ICD-10-CM | POA: Diagnosis not present

## 2016-06-20 DIAGNOSIS — D509 Iron deficiency anemia, unspecified: Secondary | ICD-10-CM | POA: Diagnosis not present

## 2016-06-20 DIAGNOSIS — N2581 Secondary hyperparathyroidism of renal origin: Secondary | ICD-10-CM | POA: Diagnosis not present

## 2016-06-20 DIAGNOSIS — D630 Anemia in neoplastic disease: Secondary | ICD-10-CM | POA: Diagnosis not present

## 2016-06-20 DIAGNOSIS — N186 End stage renal disease: Secondary | ICD-10-CM | POA: Diagnosis not present

## 2016-06-21 DIAGNOSIS — N2581 Secondary hyperparathyroidism of renal origin: Secondary | ICD-10-CM | POA: Diagnosis not present

## 2016-06-21 DIAGNOSIS — K769 Liver disease, unspecified: Secondary | ICD-10-CM | POA: Diagnosis not present

## 2016-06-21 DIAGNOSIS — D630 Anemia in neoplastic disease: Secondary | ICD-10-CM | POA: Diagnosis not present

## 2016-06-21 DIAGNOSIS — N186 End stage renal disease: Secondary | ICD-10-CM | POA: Diagnosis not present

## 2016-06-21 DIAGNOSIS — D513 Other dietary vitamin B12 deficiency anemia: Secondary | ICD-10-CM | POA: Diagnosis not present

## 2016-06-21 DIAGNOSIS — D509 Iron deficiency anemia, unspecified: Secondary | ICD-10-CM | POA: Diagnosis not present

## 2016-06-22 DIAGNOSIS — N186 End stage renal disease: Secondary | ICD-10-CM | POA: Diagnosis not present

## 2016-06-22 DIAGNOSIS — K769 Liver disease, unspecified: Secondary | ICD-10-CM | POA: Diagnosis not present

## 2016-06-22 DIAGNOSIS — D509 Iron deficiency anemia, unspecified: Secondary | ICD-10-CM | POA: Diagnosis not present

## 2016-06-22 DIAGNOSIS — N2581 Secondary hyperparathyroidism of renal origin: Secondary | ICD-10-CM | POA: Diagnosis not present

## 2016-06-22 DIAGNOSIS — D630 Anemia in neoplastic disease: Secondary | ICD-10-CM | POA: Diagnosis not present

## 2016-06-22 DIAGNOSIS — D513 Other dietary vitamin B12 deficiency anemia: Secondary | ICD-10-CM | POA: Diagnosis not present

## 2016-06-23 DIAGNOSIS — N186 End stage renal disease: Secondary | ICD-10-CM | POA: Diagnosis not present

## 2016-06-23 DIAGNOSIS — N2581 Secondary hyperparathyroidism of renal origin: Secondary | ICD-10-CM | POA: Diagnosis not present

## 2016-06-23 DIAGNOSIS — K769 Liver disease, unspecified: Secondary | ICD-10-CM | POA: Diagnosis not present

## 2016-06-23 DIAGNOSIS — D509 Iron deficiency anemia, unspecified: Secondary | ICD-10-CM | POA: Diagnosis not present

## 2016-06-23 DIAGNOSIS — D630 Anemia in neoplastic disease: Secondary | ICD-10-CM | POA: Diagnosis not present

## 2016-06-23 DIAGNOSIS — D513 Other dietary vitamin B12 deficiency anemia: Secondary | ICD-10-CM | POA: Diagnosis not present

## 2016-06-24 DIAGNOSIS — D630 Anemia in neoplastic disease: Secondary | ICD-10-CM | POA: Diagnosis not present

## 2016-06-24 DIAGNOSIS — N186 End stage renal disease: Secondary | ICD-10-CM | POA: Diagnosis not present

## 2016-06-24 DIAGNOSIS — D509 Iron deficiency anemia, unspecified: Secondary | ICD-10-CM | POA: Diagnosis not present

## 2016-06-24 DIAGNOSIS — K769 Liver disease, unspecified: Secondary | ICD-10-CM | POA: Diagnosis not present

## 2016-06-24 DIAGNOSIS — D513 Other dietary vitamin B12 deficiency anemia: Secondary | ICD-10-CM | POA: Diagnosis not present

## 2016-06-24 DIAGNOSIS — N2581 Secondary hyperparathyroidism of renal origin: Secondary | ICD-10-CM | POA: Diagnosis not present

## 2016-06-25 DIAGNOSIS — K769 Liver disease, unspecified: Secondary | ICD-10-CM | POA: Diagnosis not present

## 2016-06-25 DIAGNOSIS — D630 Anemia in neoplastic disease: Secondary | ICD-10-CM | POA: Diagnosis not present

## 2016-06-25 DIAGNOSIS — D509 Iron deficiency anemia, unspecified: Secondary | ICD-10-CM | POA: Diagnosis not present

## 2016-06-25 DIAGNOSIS — N2581 Secondary hyperparathyroidism of renal origin: Secondary | ICD-10-CM | POA: Diagnosis not present

## 2016-06-25 DIAGNOSIS — D513 Other dietary vitamin B12 deficiency anemia: Secondary | ICD-10-CM | POA: Diagnosis not present

## 2016-06-25 DIAGNOSIS — N186 End stage renal disease: Secondary | ICD-10-CM | POA: Diagnosis not present

## 2016-06-26 DIAGNOSIS — D509 Iron deficiency anemia, unspecified: Secondary | ICD-10-CM | POA: Diagnosis not present

## 2016-06-26 DIAGNOSIS — N186 End stage renal disease: Secondary | ICD-10-CM | POA: Diagnosis not present

## 2016-06-26 DIAGNOSIS — D630 Anemia in neoplastic disease: Secondary | ICD-10-CM | POA: Diagnosis not present

## 2016-06-26 DIAGNOSIS — D513 Other dietary vitamin B12 deficiency anemia: Secondary | ICD-10-CM | POA: Diagnosis not present

## 2016-06-26 DIAGNOSIS — N2581 Secondary hyperparathyroidism of renal origin: Secondary | ICD-10-CM | POA: Diagnosis not present

## 2016-06-26 DIAGNOSIS — K769 Liver disease, unspecified: Secondary | ICD-10-CM | POA: Diagnosis not present

## 2016-06-27 DIAGNOSIS — N186 End stage renal disease: Secondary | ICD-10-CM | POA: Diagnosis not present

## 2016-06-27 DIAGNOSIS — N2581 Secondary hyperparathyroidism of renal origin: Secondary | ICD-10-CM | POA: Diagnosis not present

## 2016-06-27 DIAGNOSIS — D513 Other dietary vitamin B12 deficiency anemia: Secondary | ICD-10-CM | POA: Diagnosis not present

## 2016-06-27 DIAGNOSIS — D630 Anemia in neoplastic disease: Secondary | ICD-10-CM | POA: Diagnosis not present

## 2016-06-27 DIAGNOSIS — K769 Liver disease, unspecified: Secondary | ICD-10-CM | POA: Diagnosis not present

## 2016-06-27 DIAGNOSIS — D509 Iron deficiency anemia, unspecified: Secondary | ICD-10-CM | POA: Diagnosis not present

## 2016-06-28 DIAGNOSIS — D513 Other dietary vitamin B12 deficiency anemia: Secondary | ICD-10-CM | POA: Diagnosis not present

## 2016-06-28 DIAGNOSIS — D509 Iron deficiency anemia, unspecified: Secondary | ICD-10-CM | POA: Diagnosis not present

## 2016-06-28 DIAGNOSIS — N186 End stage renal disease: Secondary | ICD-10-CM | POA: Diagnosis not present

## 2016-06-28 DIAGNOSIS — K769 Liver disease, unspecified: Secondary | ICD-10-CM | POA: Diagnosis not present

## 2016-06-28 DIAGNOSIS — N2581 Secondary hyperparathyroidism of renal origin: Secondary | ICD-10-CM | POA: Diagnosis not present

## 2016-06-28 DIAGNOSIS — D630 Anemia in neoplastic disease: Secondary | ICD-10-CM | POA: Diagnosis not present

## 2016-06-29 DIAGNOSIS — N2581 Secondary hyperparathyroidism of renal origin: Secondary | ICD-10-CM | POA: Diagnosis not present

## 2016-06-29 DIAGNOSIS — D509 Iron deficiency anemia, unspecified: Secondary | ICD-10-CM | POA: Diagnosis not present

## 2016-06-29 DIAGNOSIS — K769 Liver disease, unspecified: Secondary | ICD-10-CM | POA: Diagnosis not present

## 2016-06-29 DIAGNOSIS — D513 Other dietary vitamin B12 deficiency anemia: Secondary | ICD-10-CM | POA: Diagnosis not present

## 2016-06-29 DIAGNOSIS — D630 Anemia in neoplastic disease: Secondary | ICD-10-CM | POA: Diagnosis not present

## 2016-06-29 DIAGNOSIS — N186 End stage renal disease: Secondary | ICD-10-CM | POA: Diagnosis not present

## 2016-06-30 DIAGNOSIS — D509 Iron deficiency anemia, unspecified: Secondary | ICD-10-CM | POA: Diagnosis not present

## 2016-06-30 DIAGNOSIS — D513 Other dietary vitamin B12 deficiency anemia: Secondary | ICD-10-CM | POA: Diagnosis not present

## 2016-06-30 DIAGNOSIS — N186 End stage renal disease: Secondary | ICD-10-CM | POA: Diagnosis not present

## 2016-06-30 DIAGNOSIS — K769 Liver disease, unspecified: Secondary | ICD-10-CM | POA: Diagnosis not present

## 2016-06-30 DIAGNOSIS — D630 Anemia in neoplastic disease: Secondary | ICD-10-CM | POA: Diagnosis not present

## 2016-06-30 DIAGNOSIS — N2581 Secondary hyperparathyroidism of renal origin: Secondary | ICD-10-CM | POA: Diagnosis not present

## 2016-07-01 DIAGNOSIS — N2581 Secondary hyperparathyroidism of renal origin: Secondary | ICD-10-CM | POA: Diagnosis not present

## 2016-07-01 DIAGNOSIS — D513 Other dietary vitamin B12 deficiency anemia: Secondary | ICD-10-CM | POA: Diagnosis not present

## 2016-07-01 DIAGNOSIS — D509 Iron deficiency anemia, unspecified: Secondary | ICD-10-CM | POA: Diagnosis not present

## 2016-07-01 DIAGNOSIS — N186 End stage renal disease: Secondary | ICD-10-CM | POA: Diagnosis not present

## 2016-07-01 DIAGNOSIS — K769 Liver disease, unspecified: Secondary | ICD-10-CM | POA: Diagnosis not present

## 2016-07-01 DIAGNOSIS — D630 Anemia in neoplastic disease: Secondary | ICD-10-CM | POA: Diagnosis not present

## 2016-07-02 DIAGNOSIS — N186 End stage renal disease: Secondary | ICD-10-CM | POA: Diagnosis not present

## 2016-07-02 DIAGNOSIS — N2581 Secondary hyperparathyroidism of renal origin: Secondary | ICD-10-CM | POA: Diagnosis not present

## 2016-07-02 DIAGNOSIS — D513 Other dietary vitamin B12 deficiency anemia: Secondary | ICD-10-CM | POA: Diagnosis not present

## 2016-07-02 DIAGNOSIS — K769 Liver disease, unspecified: Secondary | ICD-10-CM | POA: Diagnosis not present

## 2016-07-02 DIAGNOSIS — D509 Iron deficiency anemia, unspecified: Secondary | ICD-10-CM | POA: Diagnosis not present

## 2016-07-02 DIAGNOSIS — D630 Anemia in neoplastic disease: Secondary | ICD-10-CM | POA: Diagnosis not present

## 2016-07-03 DIAGNOSIS — D513 Other dietary vitamin B12 deficiency anemia: Secondary | ICD-10-CM | POA: Diagnosis not present

## 2016-07-03 DIAGNOSIS — D630 Anemia in neoplastic disease: Secondary | ICD-10-CM | POA: Diagnosis not present

## 2016-07-03 DIAGNOSIS — D509 Iron deficiency anemia, unspecified: Secondary | ICD-10-CM | POA: Diagnosis not present

## 2016-07-03 DIAGNOSIS — N186 End stage renal disease: Secondary | ICD-10-CM | POA: Diagnosis not present

## 2016-07-03 DIAGNOSIS — K769 Liver disease, unspecified: Secondary | ICD-10-CM | POA: Diagnosis not present

## 2016-07-03 DIAGNOSIS — N2581 Secondary hyperparathyroidism of renal origin: Secondary | ICD-10-CM | POA: Diagnosis not present

## 2016-07-04 DIAGNOSIS — N2581 Secondary hyperparathyroidism of renal origin: Secondary | ICD-10-CM | POA: Diagnosis not present

## 2016-07-04 DIAGNOSIS — D630 Anemia in neoplastic disease: Secondary | ICD-10-CM | POA: Diagnosis not present

## 2016-07-04 DIAGNOSIS — N186 End stage renal disease: Secondary | ICD-10-CM | POA: Diagnosis not present

## 2016-07-04 DIAGNOSIS — K769 Liver disease, unspecified: Secondary | ICD-10-CM | POA: Diagnosis not present

## 2016-07-04 DIAGNOSIS — D509 Iron deficiency anemia, unspecified: Secondary | ICD-10-CM | POA: Diagnosis not present

## 2016-07-04 DIAGNOSIS — D513 Other dietary vitamin B12 deficiency anemia: Secondary | ICD-10-CM | POA: Diagnosis not present

## 2016-07-05 DIAGNOSIS — N186 End stage renal disease: Secondary | ICD-10-CM | POA: Diagnosis not present

## 2016-07-05 DIAGNOSIS — N2581 Secondary hyperparathyroidism of renal origin: Secondary | ICD-10-CM | POA: Diagnosis not present

## 2016-07-05 DIAGNOSIS — D513 Other dietary vitamin B12 deficiency anemia: Secondary | ICD-10-CM | POA: Diagnosis not present

## 2016-07-05 DIAGNOSIS — K769 Liver disease, unspecified: Secondary | ICD-10-CM | POA: Diagnosis not present

## 2016-07-05 DIAGNOSIS — D630 Anemia in neoplastic disease: Secondary | ICD-10-CM | POA: Diagnosis not present

## 2016-07-05 DIAGNOSIS — D509 Iron deficiency anemia, unspecified: Secondary | ICD-10-CM | POA: Diagnosis not present

## 2016-07-06 DIAGNOSIS — D509 Iron deficiency anemia, unspecified: Secondary | ICD-10-CM | POA: Diagnosis not present

## 2016-07-06 DIAGNOSIS — N186 End stage renal disease: Secondary | ICD-10-CM | POA: Diagnosis not present

## 2016-07-06 DIAGNOSIS — D630 Anemia in neoplastic disease: Secondary | ICD-10-CM | POA: Diagnosis not present

## 2016-07-06 DIAGNOSIS — K769 Liver disease, unspecified: Secondary | ICD-10-CM | POA: Diagnosis not present

## 2016-07-06 DIAGNOSIS — D513 Other dietary vitamin B12 deficiency anemia: Secondary | ICD-10-CM | POA: Diagnosis not present

## 2016-07-06 DIAGNOSIS — N2581 Secondary hyperparathyroidism of renal origin: Secondary | ICD-10-CM | POA: Diagnosis not present

## 2016-07-07 DIAGNOSIS — N2581 Secondary hyperparathyroidism of renal origin: Secondary | ICD-10-CM | POA: Diagnosis not present

## 2016-07-07 DIAGNOSIS — K769 Liver disease, unspecified: Secondary | ICD-10-CM | POA: Diagnosis not present

## 2016-07-07 DIAGNOSIS — D513 Other dietary vitamin B12 deficiency anemia: Secondary | ICD-10-CM | POA: Diagnosis not present

## 2016-07-07 DIAGNOSIS — D630 Anemia in neoplastic disease: Secondary | ICD-10-CM | POA: Diagnosis not present

## 2016-07-07 DIAGNOSIS — N186 End stage renal disease: Secondary | ICD-10-CM | POA: Diagnosis not present

## 2016-07-07 DIAGNOSIS — D509 Iron deficiency anemia, unspecified: Secondary | ICD-10-CM | POA: Diagnosis not present

## 2016-07-08 DIAGNOSIS — D513 Other dietary vitamin B12 deficiency anemia: Secondary | ICD-10-CM | POA: Diagnosis not present

## 2016-07-08 DIAGNOSIS — N2581 Secondary hyperparathyroidism of renal origin: Secondary | ICD-10-CM | POA: Diagnosis not present

## 2016-07-08 DIAGNOSIS — N186 End stage renal disease: Secondary | ICD-10-CM | POA: Diagnosis not present

## 2016-07-08 DIAGNOSIS — D630 Anemia in neoplastic disease: Secondary | ICD-10-CM | POA: Diagnosis not present

## 2016-07-08 DIAGNOSIS — K769 Liver disease, unspecified: Secondary | ICD-10-CM | POA: Diagnosis not present

## 2016-07-08 DIAGNOSIS — D509 Iron deficiency anemia, unspecified: Secondary | ICD-10-CM | POA: Diagnosis not present

## 2016-07-09 DIAGNOSIS — N2581 Secondary hyperparathyroidism of renal origin: Secondary | ICD-10-CM | POA: Diagnosis not present

## 2016-07-09 DIAGNOSIS — D513 Other dietary vitamin B12 deficiency anemia: Secondary | ICD-10-CM | POA: Diagnosis not present

## 2016-07-09 DIAGNOSIS — N186 End stage renal disease: Secondary | ICD-10-CM | POA: Diagnosis not present

## 2016-07-09 DIAGNOSIS — D630 Anemia in neoplastic disease: Secondary | ICD-10-CM | POA: Diagnosis not present

## 2016-07-09 DIAGNOSIS — D509 Iron deficiency anemia, unspecified: Secondary | ICD-10-CM | POA: Diagnosis not present

## 2016-07-09 DIAGNOSIS — K769 Liver disease, unspecified: Secondary | ICD-10-CM | POA: Diagnosis not present

## 2016-07-10 DIAGNOSIS — D630 Anemia in neoplastic disease: Secondary | ICD-10-CM | POA: Diagnosis not present

## 2016-07-10 DIAGNOSIS — N186 End stage renal disease: Secondary | ICD-10-CM | POA: Diagnosis not present

## 2016-07-10 DIAGNOSIS — D513 Other dietary vitamin B12 deficiency anemia: Secondary | ICD-10-CM | POA: Diagnosis not present

## 2016-07-10 DIAGNOSIS — D509 Iron deficiency anemia, unspecified: Secondary | ICD-10-CM | POA: Diagnosis not present

## 2016-07-10 DIAGNOSIS — K769 Liver disease, unspecified: Secondary | ICD-10-CM | POA: Diagnosis not present

## 2016-07-10 DIAGNOSIS — N2581 Secondary hyperparathyroidism of renal origin: Secondary | ICD-10-CM | POA: Diagnosis not present

## 2016-07-11 ENCOUNTER — Encounter (HOSPITAL_COMMUNITY): Payer: Self-pay

## 2016-07-11 ENCOUNTER — Emergency Department (HOSPITAL_COMMUNITY)
Admission: EM | Admit: 2016-07-11 | Discharge: 2016-07-11 | Disposition: A | Discharge status: Home / Self Care | Payer: Medicare Other | Attending: Emergency Medicine | Admitting: Emergency Medicine

## 2016-07-11 DIAGNOSIS — D509 Iron deficiency anemia, unspecified: Secondary | ICD-10-CM | POA: Diagnosis not present

## 2016-07-11 DIAGNOSIS — Z992 Dependence on renal dialysis: Secondary | ICD-10-CM | POA: Insufficient documentation

## 2016-07-11 DIAGNOSIS — N186 End stage renal disease: Secondary | ICD-10-CM | POA: Insufficient documentation

## 2016-07-11 DIAGNOSIS — I120 Hypertensive chronic kidney disease with stage 5 chronic kidney disease or end stage renal disease: Secondary | ICD-10-CM | POA: Insufficient documentation

## 2016-07-11 DIAGNOSIS — Z87891 Personal history of nicotine dependence: Secondary | ICD-10-CM | POA: Diagnosis not present

## 2016-07-11 DIAGNOSIS — D513 Other dietary vitamin B12 deficiency anemia: Secondary | ICD-10-CM | POA: Diagnosis not present

## 2016-07-11 DIAGNOSIS — D630 Anemia in neoplastic disease: Secondary | ICD-10-CM | POA: Diagnosis not present

## 2016-07-11 DIAGNOSIS — Z79899 Other long term (current) drug therapy: Secondary | ICD-10-CM | POA: Diagnosis not present

## 2016-07-11 DIAGNOSIS — K769 Liver disease, unspecified: Secondary | ICD-10-CM | POA: Diagnosis not present

## 2016-07-11 DIAGNOSIS — Z111 Encounter for screening for respiratory tuberculosis: Secondary | ICD-10-CM

## 2016-07-11 DIAGNOSIS — N2581 Secondary hyperparathyroidism of renal origin: Secondary | ICD-10-CM | POA: Diagnosis not present

## 2016-07-11 MED ORDER — TUBERCULIN PPD 5 UNIT/0.1ML ID SOLN
5.0000 [IU] | Freq: Once | INTRADERMAL | Status: DC
  Administered 2016-07-11: 5 [IU] via INTRADERMAL
  Filled 2016-07-11: qty 0.1

## 2016-07-11 NOTE — Discharge Instructions (Signed)
Please return to ED, Urgent care or primary care office in 48 hours for reading of your TB skin test. Return to ED for productive cough, chest pain, fever, night sweats, weight loss, trouble breathing.

## 2016-07-11 NOTE — ED Provider Notes (Signed)
[facility] DEPT Provider Note   CSN: [id number] Arrival date & time: 07/11/16  1103     History   Chief Complaint Chief Complaint  Patient presents with  . Other    TB exposure    HPI Meghan Richardson is a 36 y.o. female.  HPI  Patient presents to ED for request for TB skin testing. 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 She reports previous negative TB skin tests with her most recent one being approximately 18 months ago. She denies any BCG vaccine exposure. She is currently symptom-free, but she just wants to be "better safe than sorry." She denies cough, fever, night sweats, weight loss. Patient is currently on dialysis.  Past Medical History:  Diagnosis Date  . FSGS (focal segmental glomerulosclerosis)    Reports Kidney Bx at age 35  . Hypertension     Patient Active Problem List   Diagnosis Date Noted  . Peritonitis associated with peritoneal dialysis ([employer]) 10/03/2014  . Hypertension due to end stage renal disease on dialysis ([place]) 10/03/2014  . Anemia in chronic kidney disease (CKD) 10/03/2014    Past Surgical History:  Procedure Laterality Date  . AV FISTULA PLACEMENT      OB History    No data available       Home Medications    Prior to Admission medications   Medication Sig Start Date End Date Taking? Authorizing Provider  cyclobenzaprine (FLEXERIL) 5 MG tablet Take 1 tablet (5 mg total) by mouth 3 (three) times daily as needed for muscle spasms. 05/18/16   Charlesetta Shanks, MD  HYDROcodone-acetaminophen (NORCO/VICODIN) 5-325 MG per tablet Take 1 tablet by mouth every 6 (six) hours as needed. 07/22/14   Varney Biles, MD  lisinopril (PRINIVIL,ZESTRIL) 10 MG tablet Take 10 mg by mouth 2 (two) times daily.    [provider]  naproxen (NAPROSYN) 375 MG tablet Take 1 tablet (375 mg total) by mouth 2 (two) times daily. 05/18/16   Charlesetta Shanks, MD  senna (SENOKOT) 8.6 MG tablet  Take 1 tablet by mouth daily as needed for constipation.    [provider]    Family History Family History  Problem Relation Age of Onset  . Kidney disease Brother        FSGS    Social History Social History  Substance Use Topics  . Smoking status: Former Research scientist (life sciences)  . Smokeless tobacco: Never Used  . Alcohol use No     Allergies   Patient has no known allergies.   Review of Systems Review of Systems  Constitutional: Negative for appetite change, chills, fever and unexpected weight change.  HENT: Negative for rhinorrhea, sneezing and sore throat.   Respiratory: Negative for cough, chest tightness and shortness of breath.   Cardiovascular: Negative for chest pain and palpitations.  Gastrointestinal: Negative for nausea and vomiting.  Neurological: Negative for headaches.     Physical Exam Updated Vital Signs BP (!) 143/92 (BP Location: Right Arm)   Pulse 85   Temp 98.1 F (36.7 C) (Oral)   Resp 12   Ht 5\' 7"  (1.702 m)   Wt 62.1 kg (137 lb)   SpO2 99%   BMI 21.46 kg/m   Physical Exam  Constitutional: She appears well-developed and well-nourished. No distress.  HENT:  Head: Normocephalic and atraumatic.  Eyes: Conjunctivae and EOM are normal. No scleral icterus.  Neck: Normal range of motion.  Cardiovascular: Normal rate and  regular rhythm.   Pulmonary/Chest: Effort normal and breath sounds normal. No respiratory distress.  Neurological: She is alert.  Skin: No rash noted. She is not diaphoretic.  Psychiatric: She has a normal mood and affect.  Nursing note and vitals reviewed.    ED Treatments / Results  Labs (all labs ordered are listed, but only abnormal results are displayed) Labs Reviewed - No data to display  EKG  EKG Interpretation None       Radiology No results found.  Procedures Procedures (including critical care time)  Medications Ordered in ED Medications  tuberculin injection 5 Units (not administered)      Initial Impression / Assessment and Plan / ED Course  I have reviewed the triage vital signs and the nursing notes.  Pertinent labs & imaging results that were available during my care of the patient were reviewed by me and considered in my medical decision making (see chart for details).     Patient presents to the ED for requests for TB skin testing. She reports she might have been exposed earlier in the week. She currently denies any symptoms including cough, fever, night sweats, weight loss, nausea, vomiting. She reports previous negative TB skin test with the most recent one being 18 months ago. No previous exposure to BCG vaccine. Will use TB skin testing and advise patient to return in 48 hours to the ED, any urgent care or her primary care clinic for this to be read. Patient appears stable for discharge at this time. Strict return precautions given.  Final Clinical Impressions(s) / ED Diagnoses   Final diagnoses:  Screening for tuberculosis    New Prescriptions New Prescriptions   No medications on file     Delia Heady, PA-C 07/11/16 1303    Orlie Dakin, MD 07/11/16 1718

## 2016-07-11 NOTE — ED Notes (Signed)
Patient asking multiple times for a cup of ice and something to drink while walking back to room.

## 2016-07-11 NOTE — ED Triage Notes (Signed)
Per Pt, Pt may have been exposed to TB and would like to be tested. 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

## 2016-07-12 DIAGNOSIS — D630 Anemia in neoplastic disease: Secondary | ICD-10-CM | POA: Diagnosis not present

## 2016-07-12 DIAGNOSIS — D509 Iron deficiency anemia, unspecified: Secondary | ICD-10-CM | POA: Diagnosis not present

## 2016-07-12 DIAGNOSIS — N186 End stage renal disease: Secondary | ICD-10-CM | POA: Diagnosis not present

## 2016-07-12 DIAGNOSIS — D513 Other dietary vitamin B12 deficiency anemia: Secondary | ICD-10-CM | POA: Diagnosis not present

## 2016-07-12 DIAGNOSIS — N2581 Secondary hyperparathyroidism of renal origin: Secondary | ICD-10-CM | POA: Diagnosis not present

## 2016-07-12 DIAGNOSIS — K769 Liver disease, unspecified: Secondary | ICD-10-CM | POA: Diagnosis not present

## 2016-07-13 DIAGNOSIS — N186 End stage renal disease: Secondary | ICD-10-CM | POA: Diagnosis not present

## 2016-07-13 DIAGNOSIS — D509 Iron deficiency anemia, unspecified: Secondary | ICD-10-CM | POA: Diagnosis not present

## 2016-07-13 DIAGNOSIS — N2581 Secondary hyperparathyroidism of renal origin: Secondary | ICD-10-CM | POA: Diagnosis not present

## 2016-07-13 DIAGNOSIS — D630 Anemia in neoplastic disease: Secondary | ICD-10-CM | POA: Diagnosis not present

## 2016-07-13 DIAGNOSIS — D513 Other dietary vitamin B12 deficiency anemia: Secondary | ICD-10-CM | POA: Diagnosis not present

## 2016-07-13 DIAGNOSIS — Z992 Dependence on renal dialysis: Secondary | ICD-10-CM | POA: Diagnosis not present

## 2016-07-13 DIAGNOSIS — K769 Liver disease, unspecified: Secondary | ICD-10-CM | POA: Diagnosis not present

## 2016-07-14 ENCOUNTER — Encounter (HOSPITAL_COMMUNITY): Payer: Self-pay | Admitting: Emergency Medicine

## 2016-07-14 ENCOUNTER — Emergency Department (HOSPITAL_COMMUNITY)
Admission: EM | Admit: 2016-07-14 | Discharge: 2016-07-14 | Disposition: A | Discharge status: Home / Self Care | Payer: Medicare Other | Attending: Emergency Medicine | Admitting: Emergency Medicine

## 2016-07-14 ENCOUNTER — Emergency Department (HOSPITAL_COMMUNITY)
Admission: EM | Admit: 2016-07-14 | Discharge: 2016-07-14 | Discharge status: Absent without leave | Payer: Medicare Other

## 2016-07-14 ENCOUNTER — Ambulatory Visit (HOSPITAL_COMMUNITY)
Admission: EM | Admit: 2016-07-14 | Discharge: 2016-07-14 | Disposition: A | Discharge status: Home / Self Care | Payer: Medicare Other

## 2016-07-14 ENCOUNTER — Encounter (HOSPITAL_COMMUNITY): Payer: Self-pay

## 2016-07-14 DIAGNOSIS — D630 Anemia in neoplastic disease: Secondary | ICD-10-CM | POA: Diagnosis not present

## 2016-07-14 DIAGNOSIS — N2581 Secondary hyperparathyroidism of renal origin: Secondary | ICD-10-CM | POA: Diagnosis not present

## 2016-07-14 DIAGNOSIS — E440 Moderate protein-calorie malnutrition: Secondary | ICD-10-CM | POA: Diagnosis not present

## 2016-07-14 DIAGNOSIS — D509 Iron deficiency anemia, unspecified: Secondary | ICD-10-CM | POA: Diagnosis not present

## 2016-07-14 DIAGNOSIS — I10 Essential (primary) hypertension: Secondary | ICD-10-CM | POA: Insufficient documentation

## 2016-07-14 DIAGNOSIS — N186 End stage renal disease: Secondary | ICD-10-CM | POA: Diagnosis not present

## 2016-07-14 DIAGNOSIS — Z111 Encounter for screening for respiratory tuberculosis: Secondary | ICD-10-CM | POA: Diagnosis not present

## 2016-07-14 DIAGNOSIS — Z79899 Other long term (current) drug therapy: Secondary | ICD-10-CM | POA: Insufficient documentation

## 2016-07-14 DIAGNOSIS — Z5189 Encounter for other specified aftercare: Secondary | ICD-10-CM | POA: Diagnosis not present

## 2016-07-14 DIAGNOSIS — N2589 Other disorders resulting from impaired renal tubular function: Secondary | ICD-10-CM | POA: Diagnosis not present

## 2016-07-14 NOTE — Discharge Instructions (Signed)
As discussed, your PPD test was non-reactive.  Please be sure to follow-up with your physician as needed

## 2016-07-14 NOTE — ED Triage Notes (Signed)
Pt reports she had  TB skin test done on the June 28th to right forearm and went to urgent care today to read it but was told she waited too long pasted 72 hours. Pt states she needs to have it repeated. No swelling noted at the injection site. She states she needs it to be done so she can be cleared for work. Denies night sweats/fever.

## 2016-07-14 NOTE — ED Notes (Signed)
Patient should not be charged for this visit

## 2016-07-14 NOTE — ED Provider Notes (Signed)
[facility] DEPT Provider Note   CSN: [id number] Arrival date & time: 07/14/16  1919     History   Chief Complaint Chief Complaint  Patient presents with  . PPD Reading    HPI Meghan Richardson is a 36 y.o. female.  HPI  Patient presents for evaluation of recently placed PPD test. Test was placed 3 days ago. 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 She denies any other symptoms clinically fever, chills, dyspnea. She is a peritoneal dialysis patient, but denies consultations from this.   Past Medical History:  Diagnosis Date  . FSGS (focal segmental glomerulosclerosis)    Reports Kidney Bx at age 68  . Hypertension     Patient Active Problem List   Diagnosis Date Noted  . Peritonitis associated with peritoneal dialysis ([place] [place]) 10/03/2014  . Hypertension due to end stage renal disease on dialysis ([place]) 10/03/2014  . Anemia in chronic kidney disease (CKD) 10/03/2014    Past Surgical History:  Procedure Laterality Date  . AV FISTULA PLACEMENT      OB History    No data available       Home Medications    Prior to Admission medications   Medication Sig Start Date End Date Taking? Authorizing Provider  cyclobenzaprine (FLEXERIL) 5 MG tablet Take 1 tablet (5 mg total) by mouth 3 (three) times daily as needed for muscle spasms. 05/18/16   Charlesetta Shanks, MD  HYDROcodone-acetaminophen (NORCO/VICODIN) 5-325 MG per tablet Take 1 tablet by mouth every 6 (six) hours as needed. 07/22/14   Varney Biles, MD  lisinopril (PRINIVIL,ZESTRIL) 10 MG tablet Take 10 mg by mouth 2 (two) times daily.    [provider]  naproxen (NAPROSYN) 375 MG tablet Take 1 tablet (375 mg total) by mouth 2 (two) times daily. 05/18/16   Charlesetta Shanks, MD  senna (SENOKOT) 8.6 MG tablet Take 1 tablet by mouth daily as needed for constipation.    [provider]    Family History Family History  Problem Relation Age of Onset  . Kidney disease  Brother        FSGS    Social History Social History  Substance Use Topics  . Smoking status: Former Research scientist (life sciences)  . Smokeless tobacco: Never Used  . Alcohol use No     Allergies   Patient has no known allergies.   Review of Systems Review of Systems  Constitutional: Negative for fever.  Respiratory: Negative for shortness of breath.   Musculoskeletal:       Negative aside from HPI  Skin: Negative for color change, rash and wound.  Allergic/Immunologic: Negative for immunocompromised state.  Neurological: Negative for weakness.     Physical Exam Updated Vital Signs BP (!) 140/94 (BP Location: Right Arm)   Pulse 81   Temp 98.4 F (36.9 C) (Oral)   Resp 18   Ht 5\' 7"  (1.702 m)   Wt 62.1 kg (137 lb)   SpO2 99%   BMI 21.46 kg/m   Physical Exam  Constitutional: She is oriented to person, place, and time. She appears well-developed and well-nourished. No distress.  HENT:  Head: Normocephalic and atraumatic.  Cardiovascular: Normal rate, regular rhythm and intact distal pulses.   Musculoskeletal: She exhibits no deformity.  Neurological: She is alert and oriented to person, place, and time.  Skin: She is not diaphoretic.  Right forearm with no elevation, no erythema, no evidence for positive reactivity to the test.  Nursing note and  vitals reviewed.    ED Treatments / Results   Procedures Procedures (including critical care time)  Initial Impression / Assessment and Plan / ED Course  I have reviewed the triage vital signs and the nursing notes.  Pertinent labs & imaging results that were available during my care of the patient were reviewed by me and considered in my medical decision making (see chart for details).  Patient presents for PPD test reading. No evidence for reactivity, no other complaints, patient discharged in stable condition.  Final Clinical Impressions(s) / ED Diagnoses   Final diagnoses:  Encounter for PPD skin test reading     Carmin Muskrat, MD 07/14/16 2006

## 2016-07-14 NOTE — ED Triage Notes (Signed)
Pt needs TB skin test reading.

## 2016-07-15 DIAGNOSIS — D509 Iron deficiency anemia, unspecified: Secondary | ICD-10-CM | POA: Diagnosis not present

## 2016-07-15 DIAGNOSIS — N2589 Other disorders resulting from impaired renal tubular function: Secondary | ICD-10-CM | POA: Diagnosis not present

## 2016-07-15 DIAGNOSIS — Z79899 Other long term (current) drug therapy: Secondary | ICD-10-CM | POA: Diagnosis not present

## 2016-07-15 DIAGNOSIS — Z5189 Encounter for other specified aftercare: Secondary | ICD-10-CM | POA: Diagnosis not present

## 2016-07-15 DIAGNOSIS — N186 End stage renal disease: Secondary | ICD-10-CM | POA: Diagnosis not present

## 2016-07-15 DIAGNOSIS — D630 Anemia in neoplastic disease: Secondary | ICD-10-CM | POA: Diagnosis not present

## 2016-07-16 DIAGNOSIS — D509 Iron deficiency anemia, unspecified: Secondary | ICD-10-CM | POA: Diagnosis not present

## 2016-07-16 DIAGNOSIS — N186 End stage renal disease: Secondary | ICD-10-CM | POA: Diagnosis not present

## 2016-07-16 DIAGNOSIS — D630 Anemia in neoplastic disease: Secondary | ICD-10-CM | POA: Diagnosis not present

## 2016-07-16 DIAGNOSIS — N2589 Other disorders resulting from impaired renal tubular function: Secondary | ICD-10-CM | POA: Diagnosis not present

## 2016-07-16 DIAGNOSIS — Z5189 Encounter for other specified aftercare: Secondary | ICD-10-CM | POA: Diagnosis not present

## 2016-07-16 DIAGNOSIS — Z79899 Other long term (current) drug therapy: Secondary | ICD-10-CM | POA: Diagnosis not present

## 2016-07-17 DIAGNOSIS — N186 End stage renal disease: Secondary | ICD-10-CM | POA: Diagnosis not present

## 2016-07-17 DIAGNOSIS — D509 Iron deficiency anemia, unspecified: Secondary | ICD-10-CM | POA: Diagnosis not present

## 2016-07-17 DIAGNOSIS — Z79899 Other long term (current) drug therapy: Secondary | ICD-10-CM | POA: Diagnosis not present

## 2016-07-17 DIAGNOSIS — N2589 Other disorders resulting from impaired renal tubular function: Secondary | ICD-10-CM | POA: Diagnosis not present

## 2016-07-17 DIAGNOSIS — Z5189 Encounter for other specified aftercare: Secondary | ICD-10-CM | POA: Diagnosis not present

## 2016-07-17 DIAGNOSIS — D630 Anemia in neoplastic disease: Secondary | ICD-10-CM | POA: Diagnosis not present

## 2016-07-18 ENCOUNTER — Emergency Department (HOSPITAL_COMMUNITY)
Admission: EM | Admit: 2016-07-18 | Discharge: 2016-07-18 | Disposition: A | Discharge status: Home / Self Care | Payer: No Typology Code available for payment source | Attending: Emergency Medicine | Admitting: Emergency Medicine

## 2016-07-18 ENCOUNTER — Encounter (HOSPITAL_COMMUNITY): Payer: Self-pay

## 2016-07-18 DIAGNOSIS — Z5189 Encounter for other specified aftercare: Secondary | ICD-10-CM | POA: Diagnosis not present

## 2016-07-18 DIAGNOSIS — Z79899 Other long term (current) drug therapy: Secondary | ICD-10-CM | POA: Diagnosis not present

## 2016-07-18 DIAGNOSIS — Y92410 Unspecified street and highway as the place of occurrence of the external cause: Secondary | ICD-10-CM | POA: Diagnosis not present

## 2016-07-18 DIAGNOSIS — Y998 Other external cause status: Secondary | ICD-10-CM | POA: Diagnosis not present

## 2016-07-18 DIAGNOSIS — R0781 Pleurodynia: Secondary | ICD-10-CM

## 2016-07-18 DIAGNOSIS — Z87891 Personal history of nicotine dependence: Secondary | ICD-10-CM | POA: Insufficient documentation

## 2016-07-18 DIAGNOSIS — N2589 Other disorders resulting from impaired renal tubular function: Secondary | ICD-10-CM | POA: Diagnosis not present

## 2016-07-18 DIAGNOSIS — I10 Essential (primary) hypertension: Secondary | ICD-10-CM | POA: Insufficient documentation

## 2016-07-18 DIAGNOSIS — R0789 Other chest pain: Secondary | ICD-10-CM | POA: Diagnosis not present

## 2016-07-18 DIAGNOSIS — D630 Anemia in neoplastic disease: Secondary | ICD-10-CM | POA: Diagnosis not present

## 2016-07-18 DIAGNOSIS — Y9389 Activity, other specified: Secondary | ICD-10-CM | POA: Diagnosis not present

## 2016-07-18 DIAGNOSIS — M25511 Pain in right shoulder: Secondary | ICD-10-CM | POA: Insufficient documentation

## 2016-07-18 DIAGNOSIS — D509 Iron deficiency anemia, unspecified: Secondary | ICD-10-CM | POA: Diagnosis not present

## 2016-07-18 DIAGNOSIS — N186 End stage renal disease: Secondary | ICD-10-CM | POA: Diagnosis not present
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
## 2016-07-18 NOTE — Discharge Instructions (Signed)
Please read attached information. If you experience any new or worsening signs or symptoms please return to the emergency room for evaluation. Please follow-up with your primary care provider or specialist as discussed.  °

## 2016-07-19 DIAGNOSIS — D509 Iron deficiency anemia, unspecified: Secondary | ICD-10-CM | POA: Diagnosis not present

## 2016-07-19 DIAGNOSIS — D630 Anemia in neoplastic disease: Secondary | ICD-10-CM | POA: Diagnosis not present

## 2016-07-19 DIAGNOSIS — Z79899 Other long term (current) drug therapy: Secondary | ICD-10-CM | POA: Diagnosis not present

## 2016-07-19 DIAGNOSIS — Z5189 Encounter for other specified aftercare: Secondary | ICD-10-CM | POA: Diagnosis not present

## 2016-07-19 DIAGNOSIS — N2589 Other disorders resulting from impaired renal tubular function: Secondary | ICD-10-CM | POA: Diagnosis not present

## 2016-07-19 DIAGNOSIS — N186 End stage renal disease: Secondary | ICD-10-CM | POA: Diagnosis not present

## 2016-07-20 DIAGNOSIS — D630 Anemia in neoplastic disease: Secondary | ICD-10-CM | POA: Diagnosis not present

## 2016-07-20 DIAGNOSIS — Z5189 Encounter for other specified aftercare: Secondary | ICD-10-CM | POA: Diagnosis not present

## 2016-07-20 DIAGNOSIS — D509 Iron deficiency anemia, unspecified: Secondary | ICD-10-CM | POA: Diagnosis not present

## 2016-07-20 DIAGNOSIS — N186 End stage renal disease: Secondary | ICD-10-CM | POA: Diagnosis not present

## 2016-07-20 DIAGNOSIS — Z79899 Other long term (current) drug therapy: Secondary | ICD-10-CM | POA: Diagnosis not present

## 2016-07-20 DIAGNOSIS — N2589 Other disorders resulting from impaired renal tubular function: Secondary | ICD-10-CM | POA: Diagnosis not present

## 2016-07-21 DIAGNOSIS — Z5189 Encounter for other specified aftercare: Secondary | ICD-10-CM | POA: Diagnosis not present

## 2016-07-21 DIAGNOSIS — D630 Anemia in neoplastic disease: Secondary | ICD-10-CM | POA: Diagnosis not present

## 2016-07-21 DIAGNOSIS — N2589 Other disorders resulting from impaired renal tubular function: Secondary | ICD-10-CM | POA: Diagnosis not present

## 2016-07-21 DIAGNOSIS — N186 End stage renal disease: Secondary | ICD-10-CM | POA: Diagnosis not present

## 2016-07-21 DIAGNOSIS — D509 Iron deficiency anemia, unspecified: Secondary | ICD-10-CM | POA: Diagnosis not present

## 2016-07-21 DIAGNOSIS — Z79899 Other long term (current) drug therapy: Secondary | ICD-10-CM | POA: Diagnosis not present

## 2016-07-22 DIAGNOSIS — Z79899 Other long term (current) drug therapy: Secondary | ICD-10-CM | POA: Diagnosis not present

## 2016-07-22 DIAGNOSIS — N186 End stage renal disease: Secondary | ICD-10-CM | POA: Diagnosis not present

## 2016-07-22 DIAGNOSIS — Z5189 Encounter for other specified aftercare: Secondary | ICD-10-CM | POA: Diagnosis not present

## 2016-07-22 DIAGNOSIS — D630 Anemia in neoplastic disease: Secondary | ICD-10-CM | POA: Diagnosis not present

## 2016-07-22 DIAGNOSIS — D509 Iron deficiency anemia, unspecified: Secondary | ICD-10-CM | POA: Diagnosis not present

## 2016-07-22 DIAGNOSIS — N2589 Other disorders resulting from impaired renal tubular function: Secondary | ICD-10-CM | POA: Diagnosis not present

## 2016-07-23 DIAGNOSIS — Z5189 Encounter for other specified aftercare: Secondary | ICD-10-CM | POA: Diagnosis not present

## 2016-07-23 DIAGNOSIS — R8299 Other abnormal findings in urine: Secondary | ICD-10-CM | POA: Diagnosis not present

## 2016-07-23 DIAGNOSIS — N2589 Other disorders resulting from impaired renal tubular function: Secondary | ICD-10-CM | POA: Diagnosis not present

## 2016-07-23 DIAGNOSIS — N186 End stage renal disease: Secondary | ICD-10-CM | POA: Diagnosis not present

## 2016-07-23 DIAGNOSIS — Z79899 Other long term (current) drug therapy: Secondary | ICD-10-CM | POA: Diagnosis not present

## 2016-07-23 DIAGNOSIS — D630 Anemia in neoplastic disease: Secondary | ICD-10-CM | POA: Diagnosis not present

## 2016-07-23 DIAGNOSIS — D509 Iron deficiency anemia, unspecified: Secondary | ICD-10-CM | POA: Diagnosis not present

## 2016-07-24 DIAGNOSIS — Z79899 Other long term (current) drug therapy: Secondary | ICD-10-CM | POA: Diagnosis not present

## 2016-07-24 DIAGNOSIS — N186 End stage renal disease: Secondary | ICD-10-CM | POA: Diagnosis not present

## 2016-07-24 DIAGNOSIS — Z5189 Encounter for other specified aftercare: Secondary | ICD-10-CM | POA: Diagnosis not present

## 2016-07-24 DIAGNOSIS — D509 Iron deficiency anemia, unspecified: Secondary | ICD-10-CM | POA: Diagnosis not present

## 2016-07-24 DIAGNOSIS — D630 Anemia in neoplastic disease: Secondary | ICD-10-CM | POA: Diagnosis not present

## 2016-07-24 DIAGNOSIS — N2589 Other disorders resulting from impaired renal tubular function: Secondary | ICD-10-CM | POA: Diagnosis not present

## 2016-07-25 DIAGNOSIS — N2589 Other disorders resulting from impaired renal tubular function: Secondary | ICD-10-CM | POA: Diagnosis not present

## 2016-07-25 DIAGNOSIS — Z79899 Other long term (current) drug therapy: Secondary | ICD-10-CM | POA: Diagnosis not present

## 2016-07-25 DIAGNOSIS — D509 Iron deficiency anemia, unspecified: Secondary | ICD-10-CM | POA: Diagnosis not present

## 2016-07-25 DIAGNOSIS — D630 Anemia in neoplastic disease: Secondary | ICD-10-CM | POA: Diagnosis not present

## 2016-07-25 DIAGNOSIS — Z5189 Encounter for other specified aftercare: Secondary | ICD-10-CM | POA: Diagnosis not present

## 2016-07-25 DIAGNOSIS — N186 End stage renal disease: Secondary | ICD-10-CM | POA: Diagnosis not present

## 2016-07-26 DIAGNOSIS — Z79899 Other long term (current) drug therapy: Secondary | ICD-10-CM | POA: Diagnosis not present

## 2016-07-26 DIAGNOSIS — D509 Iron deficiency anemia, unspecified: Secondary | ICD-10-CM | POA: Diagnosis not present

## 2016-07-26 DIAGNOSIS — N2589 Other disorders resulting from impaired renal tubular function: Secondary | ICD-10-CM | POA: Diagnosis not present

## 2016-07-26 DIAGNOSIS — D630 Anemia in neoplastic disease: Secondary | ICD-10-CM | POA: Diagnosis not present

## 2016-07-26 DIAGNOSIS — Z5189 Encounter for other specified aftercare: Secondary | ICD-10-CM | POA: Diagnosis not present

## 2016-07-26 DIAGNOSIS — N186 End stage renal disease: Secondary | ICD-10-CM | POA: Diagnosis not present

## 2016-07-27 DIAGNOSIS — D509 Iron deficiency anemia, unspecified: Secondary | ICD-10-CM | POA: Diagnosis not present

## 2016-07-27 DIAGNOSIS — D630 Anemia in neoplastic disease: Secondary | ICD-10-CM | POA: Diagnosis not present

## 2016-07-27 DIAGNOSIS — N2589 Other disorders resulting from impaired renal tubular function: Secondary | ICD-10-CM | POA: Diagnosis not present

## 2016-07-27 DIAGNOSIS — Z5189 Encounter for other specified aftercare: Secondary | ICD-10-CM | POA: Diagnosis not present

## 2016-07-27 DIAGNOSIS — N186 End stage renal disease: Secondary | ICD-10-CM | POA: Diagnosis not present

## 2016-07-27 DIAGNOSIS — Z79899 Other long term (current) drug therapy: Secondary | ICD-10-CM | POA: Diagnosis not present

## 2016-07-28 DIAGNOSIS — N186 End stage renal disease: Secondary | ICD-10-CM | POA: Diagnosis not present

## 2016-07-28 DIAGNOSIS — Z5189 Encounter for other specified aftercare: Secondary | ICD-10-CM | POA: Diagnosis not present

## 2016-07-28 DIAGNOSIS — D630 Anemia in neoplastic disease: Secondary | ICD-10-CM | POA: Diagnosis not present

## 2016-07-28 DIAGNOSIS — N2589 Other disorders resulting from impaired renal tubular function: Secondary | ICD-10-CM | POA: Diagnosis not present

## 2016-07-28 DIAGNOSIS — Z79899 Other long term (current) drug therapy: Secondary | ICD-10-CM | POA: Diagnosis not present

## 2016-07-28 DIAGNOSIS — D509 Iron deficiency anemia, unspecified: Secondary | ICD-10-CM | POA: Diagnosis not present

## 2016-07-29 DIAGNOSIS — N2589 Other disorders resulting from impaired renal tubular function: Secondary | ICD-10-CM | POA: Diagnosis not present

## 2016-07-29 DIAGNOSIS — D509 Iron deficiency anemia, unspecified: Secondary | ICD-10-CM | POA: Diagnosis not present

## 2016-07-29 DIAGNOSIS — Z79899 Other long term (current) drug therapy: Secondary | ICD-10-CM | POA: Diagnosis not present

## 2016-07-29 DIAGNOSIS — Z5189 Encounter for other specified aftercare: Secondary | ICD-10-CM | POA: Diagnosis not present

## 2016-07-29 DIAGNOSIS — D630 Anemia in neoplastic disease: Secondary | ICD-10-CM | POA: Diagnosis not present

## 2016-07-29 DIAGNOSIS — N186 End stage renal disease: Secondary | ICD-10-CM | POA: Diagnosis not present

## 2016-07-30 DIAGNOSIS — Z5189 Encounter for other specified aftercare: Secondary | ICD-10-CM | POA: Diagnosis not present

## 2016-07-30 DIAGNOSIS — N2589 Other disorders resulting from impaired renal tubular function: Secondary | ICD-10-CM | POA: Diagnosis not present

## 2016-07-30 DIAGNOSIS — Z79899 Other long term (current) drug therapy: Secondary | ICD-10-CM | POA: Diagnosis not present

## 2016-07-30 DIAGNOSIS — N186 End stage renal disease: Secondary | ICD-10-CM | POA: Diagnosis not present

## 2016-07-30 DIAGNOSIS — D630 Anemia in neoplastic disease: Secondary | ICD-10-CM | POA: Diagnosis not present

## 2016-07-30 DIAGNOSIS — D509 Iron deficiency anemia, unspecified: Secondary | ICD-10-CM | POA: Diagnosis not present

## 2016-07-31 DIAGNOSIS — D630 Anemia in neoplastic disease: Secondary | ICD-10-CM | POA: Diagnosis not present

## 2016-07-31 DIAGNOSIS — D509 Iron deficiency anemia, unspecified: Secondary | ICD-10-CM | POA: Diagnosis not present

## 2016-07-31 DIAGNOSIS — N2589 Other disorders resulting from impaired renal tubular function: Secondary | ICD-10-CM | POA: Diagnosis not present

## 2016-07-31 DIAGNOSIS — Z5189 Encounter for other specified aftercare: Secondary | ICD-10-CM | POA: Diagnosis not present

## 2016-07-31 DIAGNOSIS — N186 End stage renal disease: Secondary | ICD-10-CM | POA: Diagnosis not present

## 2016-07-31 DIAGNOSIS — Z79899 Other long term (current) drug therapy: Secondary | ICD-10-CM | POA: Diagnosis not present

## 2016-08-01 DIAGNOSIS — D509 Iron deficiency anemia, unspecified: Secondary | ICD-10-CM | POA: Diagnosis not present

## 2016-08-01 DIAGNOSIS — Z79899 Other long term (current) drug therapy: Secondary | ICD-10-CM | POA: Diagnosis not present

## 2016-08-01 DIAGNOSIS — Z5189 Encounter for other specified aftercare: Secondary | ICD-10-CM | POA: Diagnosis not present

## 2016-08-01 DIAGNOSIS — N2589 Other disorders resulting from impaired renal tubular function: Secondary | ICD-10-CM | POA: Diagnosis not present

## 2016-08-01 DIAGNOSIS — D630 Anemia in neoplastic disease: Secondary | ICD-10-CM | POA: Diagnosis not present

## 2016-08-01 DIAGNOSIS — N186 End stage renal disease: Secondary | ICD-10-CM | POA: Diagnosis not present

## 2016-08-02 DIAGNOSIS — Z79899 Other long term (current) drug therapy: Secondary | ICD-10-CM | POA: Diagnosis not present

## 2016-08-02 DIAGNOSIS — D509 Iron deficiency anemia, unspecified: Secondary | ICD-10-CM | POA: Diagnosis not present

## 2016-08-02 DIAGNOSIS — Z5189 Encounter for other specified aftercare: Secondary | ICD-10-CM | POA: Diagnosis not present

## 2016-08-02 DIAGNOSIS — N2589 Other disorders resulting from impaired renal tubular function: Secondary | ICD-10-CM | POA: Diagnosis not present

## 2016-08-02 DIAGNOSIS — N186 End stage renal disease: Secondary | ICD-10-CM | POA: Diagnosis not present

## 2016-08-02 DIAGNOSIS — D630 Anemia in neoplastic disease: Secondary | ICD-10-CM | POA: Diagnosis not present

## 2016-08-03 DIAGNOSIS — D509 Iron deficiency anemia, unspecified: Secondary | ICD-10-CM | POA: Diagnosis not present

## 2016-08-03 DIAGNOSIS — N2589 Other disorders resulting from impaired renal tubular function: Secondary | ICD-10-CM | POA: Diagnosis not present

## 2016-08-03 DIAGNOSIS — Z5189 Encounter for other specified aftercare: Secondary | ICD-10-CM | POA: Diagnosis not present

## 2016-08-03 DIAGNOSIS — Z79899 Other long term (current) drug therapy: Secondary | ICD-10-CM | POA: Diagnosis not present

## 2016-08-03 DIAGNOSIS — N186 End stage renal disease: Secondary | ICD-10-CM | POA: Diagnosis not present

## 2016-08-03 DIAGNOSIS — D630 Anemia in neoplastic disease: Secondary | ICD-10-CM | POA: Diagnosis not present

## 2016-08-04 DIAGNOSIS — D509 Iron deficiency anemia, unspecified: Secondary | ICD-10-CM | POA: Diagnosis not present

## 2016-08-04 DIAGNOSIS — N2589 Other disorders resulting from impaired renal tubular function: Secondary | ICD-10-CM | POA: Diagnosis not present

## 2016-08-04 DIAGNOSIS — N186 End stage renal disease: Secondary | ICD-10-CM | POA: Diagnosis not present

## 2016-08-04 DIAGNOSIS — Z5189 Encounter for other specified aftercare: Secondary | ICD-10-CM | POA: Diagnosis not present

## 2016-08-04 DIAGNOSIS — D630 Anemia in neoplastic disease: Secondary | ICD-10-CM | POA: Diagnosis not present

## 2016-08-04 DIAGNOSIS — Z79899 Other long term (current) drug therapy: Secondary | ICD-10-CM | POA: Diagnosis not present

## 2016-08-05 DIAGNOSIS — N186 End stage renal disease: Secondary | ICD-10-CM | POA: Diagnosis not present

## 2016-08-05 DIAGNOSIS — Z79899 Other long term (current) drug therapy: Secondary | ICD-10-CM | POA: Diagnosis not present

## 2016-08-05 DIAGNOSIS — N2589 Other disorders resulting from impaired renal tubular function: Secondary | ICD-10-CM | POA: Diagnosis not present

## 2016-08-05 DIAGNOSIS — D509 Iron deficiency anemia, unspecified: Secondary | ICD-10-CM | POA: Diagnosis not present

## 2016-08-05 DIAGNOSIS — D630 Anemia in neoplastic disease: Secondary | ICD-10-CM | POA: Diagnosis not present

## 2016-08-05 DIAGNOSIS — Z5189 Encounter for other specified aftercare: Secondary | ICD-10-CM | POA: Diagnosis not present

## 2016-08-06 DIAGNOSIS — D630 Anemia in neoplastic disease: Secondary | ICD-10-CM | POA: Diagnosis not present

## 2016-08-06 DIAGNOSIS — N2589 Other disorders resulting from impaired renal tubular function: Secondary | ICD-10-CM | POA: Diagnosis not present

## 2016-08-06 DIAGNOSIS — D509 Iron deficiency anemia, unspecified: Secondary | ICD-10-CM | POA: Diagnosis not present

## 2016-08-06 DIAGNOSIS — N186 End stage renal disease: Secondary | ICD-10-CM | POA: Diagnosis not present

## 2016-08-06 DIAGNOSIS — Z5189 Encounter for other specified aftercare: Secondary | ICD-10-CM | POA: Diagnosis not present

## 2016-08-06 DIAGNOSIS — Z79899 Other long term (current) drug therapy: Secondary | ICD-10-CM | POA: Diagnosis not present

## 2016-08-07 DIAGNOSIS — D630 Anemia in neoplastic disease: Secondary | ICD-10-CM | POA: Diagnosis not present

## 2016-08-07 DIAGNOSIS — Z5189 Encounter for other specified aftercare: Secondary | ICD-10-CM | POA: Diagnosis not present

## 2016-08-07 DIAGNOSIS — D509 Iron deficiency anemia, unspecified: Secondary | ICD-10-CM | POA: Diagnosis not present

## 2016-08-07 DIAGNOSIS — N2589 Other disorders resulting from impaired renal tubular function: Secondary | ICD-10-CM | POA: Diagnosis not present

## 2016-08-07 DIAGNOSIS — N186 End stage renal disease: Secondary | ICD-10-CM | POA: Diagnosis not present

## 2016-08-07 DIAGNOSIS — Z79899 Other long term (current) drug therapy: Secondary | ICD-10-CM | POA: Diagnosis not present

## 2016-08-08 DIAGNOSIS — D509 Iron deficiency anemia, unspecified: Secondary | ICD-10-CM | POA: Diagnosis not present

## 2016-08-08 DIAGNOSIS — N186 End stage renal disease: Secondary | ICD-10-CM | POA: Diagnosis not present

## 2016-08-08 DIAGNOSIS — N2589 Other disorders resulting from impaired renal tubular function: Secondary | ICD-10-CM | POA: Diagnosis not present

## 2016-08-08 DIAGNOSIS — Z5189 Encounter for other specified aftercare: Secondary | ICD-10-CM | POA: Diagnosis not present

## 2016-08-08 DIAGNOSIS — D630 Anemia in neoplastic disease: Secondary | ICD-10-CM | POA: Diagnosis not present

## 2016-08-08 DIAGNOSIS — Z79899 Other long term (current) drug therapy: Secondary | ICD-10-CM | POA: Diagnosis not present

## 2016-08-09 DIAGNOSIS — D630 Anemia in neoplastic disease: Secondary | ICD-10-CM | POA: Diagnosis not present

## 2016-08-09 DIAGNOSIS — Z79899 Other long term (current) drug therapy: Secondary | ICD-10-CM | POA: Diagnosis not present

## 2016-08-09 DIAGNOSIS — Z5189 Encounter for other specified aftercare: Secondary | ICD-10-CM | POA: Diagnosis not present

## 2016-08-09 DIAGNOSIS — D509 Iron deficiency anemia, unspecified: Secondary | ICD-10-CM | POA: Diagnosis not present

## 2016-08-09 DIAGNOSIS — N186 End stage renal disease: Secondary | ICD-10-CM | POA: Diagnosis not present

## 2016-08-09 DIAGNOSIS — N2589 Other disorders resulting from impaired renal tubular function: Secondary | ICD-10-CM | POA: Diagnosis not present

## 2016-08-10 DIAGNOSIS — Z5189 Encounter for other specified aftercare: Secondary | ICD-10-CM | POA: Diagnosis not present

## 2016-08-10 DIAGNOSIS — D630 Anemia in neoplastic disease: Secondary | ICD-10-CM | POA: Diagnosis not present

## 2016-08-10 DIAGNOSIS — Z79899 Other long term (current) drug therapy: Secondary | ICD-10-CM | POA: Diagnosis not present

## 2016-08-10 DIAGNOSIS — D509 Iron deficiency anemia, unspecified: Secondary | ICD-10-CM | POA: Diagnosis not present

## 2016-08-10 DIAGNOSIS — N186 End stage renal disease: Secondary | ICD-10-CM | POA: Diagnosis not present

## 2016-08-10 DIAGNOSIS — N2589 Other disorders resulting from impaired renal tubular function: Secondary | ICD-10-CM | POA: Diagnosis not present

## 2016-08-11 DIAGNOSIS — Z5189 Encounter for other specified aftercare: Secondary | ICD-10-CM | POA: Diagnosis not present

## 2016-08-11 DIAGNOSIS — Z79899 Other long term (current) drug therapy: Secondary | ICD-10-CM | POA: Diagnosis not present

## 2016-08-11 DIAGNOSIS — N186 End stage renal disease: Secondary | ICD-10-CM | POA: Diagnosis not present

## 2016-08-11 DIAGNOSIS — D509 Iron deficiency anemia, unspecified: Secondary | ICD-10-CM | POA: Diagnosis not present

## 2016-08-11 DIAGNOSIS — D630 Anemia in neoplastic disease: Secondary | ICD-10-CM | POA: Diagnosis not present

## 2016-08-11 DIAGNOSIS — N2589 Other disorders resulting from impaired renal tubular function: Secondary | ICD-10-CM | POA: Diagnosis not present

## 2016-08-12 DIAGNOSIS — N186 End stage renal disease: Secondary | ICD-10-CM | POA: Diagnosis not present

## 2016-08-12 DIAGNOSIS — N2589 Other disorders resulting from impaired renal tubular function: Secondary | ICD-10-CM | POA: Diagnosis not present

## 2016-08-12 DIAGNOSIS — D630 Anemia in neoplastic disease: Secondary | ICD-10-CM | POA: Diagnosis not present

## 2016-08-12 DIAGNOSIS — D509 Iron deficiency anemia, unspecified: Secondary | ICD-10-CM | POA: Diagnosis not present

## 2016-08-12 DIAGNOSIS — Z79899 Other long term (current) drug therapy: Secondary | ICD-10-CM | POA: Diagnosis not present

## 2016-08-12 DIAGNOSIS — Z5189 Encounter for other specified aftercare: Secondary | ICD-10-CM | POA: Diagnosis not present

## 2016-08-13 DIAGNOSIS — D630 Anemia in neoplastic disease: Secondary | ICD-10-CM | POA: Diagnosis not present

## 2016-08-13 DIAGNOSIS — N186 End stage renal disease: Secondary | ICD-10-CM | POA: Diagnosis not present

## 2016-08-13 DIAGNOSIS — Z992 Dependence on renal dialysis: Secondary | ICD-10-CM | POA: Diagnosis not present

## 2016-08-13 DIAGNOSIS — Z5189 Encounter for other specified aftercare: Secondary | ICD-10-CM | POA: Diagnosis not present

## 2016-08-13 DIAGNOSIS — Z79899 Other long term (current) drug therapy: Secondary | ICD-10-CM | POA: Diagnosis not present

## 2016-08-13 DIAGNOSIS — D509 Iron deficiency anemia, unspecified: Secondary | ICD-10-CM | POA: Diagnosis not present

## 2016-08-13 DIAGNOSIS — N2589 Other disorders resulting from impaired renal tubular function: Secondary | ICD-10-CM | POA: Diagnosis not present

## 2016-08-14 DIAGNOSIS — Z5189 Encounter for other specified aftercare: Secondary | ICD-10-CM | POA: Diagnosis not present

## 2016-08-14 DIAGNOSIS — Z79899 Other long term (current) drug therapy: Secondary | ICD-10-CM | POA: Diagnosis not present

## 2016-08-14 DIAGNOSIS — D630 Anemia in neoplastic disease: Secondary | ICD-10-CM | POA: Diagnosis not present

## 2016-08-14 DIAGNOSIS — N2581 Secondary hyperparathyroidism of renal origin: Secondary | ICD-10-CM | POA: Diagnosis not present

## 2016-08-14 DIAGNOSIS — E440 Moderate protein-calorie malnutrition: Secondary | ICD-10-CM | POA: Diagnosis not present

## 2016-08-14 DIAGNOSIS — D509 Iron deficiency anemia, unspecified: Secondary | ICD-10-CM | POA: Diagnosis not present

## 2016-08-14 DIAGNOSIS — N186 End stage renal disease: Secondary | ICD-10-CM | POA: Diagnosis not present

## 2016-08-14 DIAGNOSIS — N2589 Other disorders resulting from impaired renal tubular function: Secondary | ICD-10-CM | POA: Diagnosis not present

## 2016-08-15 DIAGNOSIS — N2589 Other disorders resulting from impaired renal tubular function: Secondary | ICD-10-CM | POA: Diagnosis not present

## 2016-08-15 DIAGNOSIS — E440 Moderate protein-calorie malnutrition: Secondary | ICD-10-CM | POA: Diagnosis not present

## 2016-08-15 DIAGNOSIS — Z79899 Other long term (current) drug therapy: Secondary | ICD-10-CM | POA: Diagnosis not present

## 2016-08-15 DIAGNOSIS — N186 End stage renal disease: Secondary | ICD-10-CM | POA: Diagnosis not present

## 2016-08-15 DIAGNOSIS — D509 Iron deficiency anemia, unspecified: Secondary | ICD-10-CM | POA: Diagnosis not present

## 2016-08-15 DIAGNOSIS — D630 Anemia in neoplastic disease: Secondary | ICD-10-CM | POA: Diagnosis not present

## 2016-08-16 DIAGNOSIS — Z79899 Other long term (current) drug therapy: Secondary | ICD-10-CM | POA: Diagnosis not present

## 2016-08-16 DIAGNOSIS — D630 Anemia in neoplastic disease: Secondary | ICD-10-CM | POA: Diagnosis not present

## 2016-08-16 DIAGNOSIS — E440 Moderate protein-calorie malnutrition: Secondary | ICD-10-CM | POA: Diagnosis not present

## 2016-08-16 DIAGNOSIS — N186 End stage renal disease: Secondary | ICD-10-CM | POA: Diagnosis not present

## 2016-08-16 DIAGNOSIS — N2589 Other disorders resulting from impaired renal tubular function: Secondary | ICD-10-CM | POA: Diagnosis not present

## 2016-08-16 DIAGNOSIS — D509 Iron deficiency anemia, unspecified: Secondary | ICD-10-CM | POA: Diagnosis not present

## 2016-08-17 DIAGNOSIS — D509 Iron deficiency anemia, unspecified: Secondary | ICD-10-CM | POA: Diagnosis not present

## 2016-08-17 DIAGNOSIS — Z79899 Other long term (current) drug therapy: Secondary | ICD-10-CM | POA: Diagnosis not present

## 2016-08-17 DIAGNOSIS — N186 End stage renal disease: Secondary | ICD-10-CM | POA: Diagnosis not present

## 2016-08-17 DIAGNOSIS — D630 Anemia in neoplastic disease: Secondary | ICD-10-CM | POA: Diagnosis not present

## 2016-08-17 DIAGNOSIS — E440 Moderate protein-calorie malnutrition: Secondary | ICD-10-CM | POA: Diagnosis not present

## 2016-08-17 DIAGNOSIS — N2589 Other disorders resulting from impaired renal tubular function: Secondary | ICD-10-CM | POA: Diagnosis not present

## 2016-08-18 DIAGNOSIS — D630 Anemia in neoplastic disease: Secondary | ICD-10-CM | POA: Diagnosis not present

## 2016-08-18 DIAGNOSIS — E440 Moderate protein-calorie malnutrition: Secondary | ICD-10-CM | POA: Diagnosis not present

## 2016-08-18 DIAGNOSIS — N186 End stage renal disease: Secondary | ICD-10-CM | POA: Diagnosis not present

## 2016-08-18 DIAGNOSIS — Z79899 Other long term (current) drug therapy: Secondary | ICD-10-CM | POA: Diagnosis not present

## 2016-08-18 DIAGNOSIS — D509 Iron deficiency anemia, unspecified: Secondary | ICD-10-CM | POA: Diagnosis not present

## 2016-08-18 DIAGNOSIS — N2589 Other disorders resulting from impaired renal tubular function: Secondary | ICD-10-CM | POA: Diagnosis not present

## 2016-08-19 DIAGNOSIS — D509 Iron deficiency anemia, unspecified: Secondary | ICD-10-CM | POA: Diagnosis not present

## 2016-08-19 DIAGNOSIS — N2589 Other disorders resulting from impaired renal tubular function: Secondary | ICD-10-CM | POA: Diagnosis not present

## 2016-08-19 DIAGNOSIS — E440 Moderate protein-calorie malnutrition: Secondary | ICD-10-CM | POA: Diagnosis not present

## 2016-08-19 DIAGNOSIS — Z79899 Other long term (current) drug therapy: Secondary | ICD-10-CM | POA: Diagnosis not present

## 2016-08-19 DIAGNOSIS — D630 Anemia in neoplastic disease: Secondary | ICD-10-CM | POA: Diagnosis not present

## 2016-08-19 DIAGNOSIS — N186 End stage renal disease: Secondary | ICD-10-CM | POA: Diagnosis not present

## 2016-08-20 DIAGNOSIS — N2589 Other disorders resulting from impaired renal tubular function: Secondary | ICD-10-CM | POA: Diagnosis not present

## 2016-08-20 DIAGNOSIS — E440 Moderate protein-calorie malnutrition: Secondary | ICD-10-CM | POA: Diagnosis not present

## 2016-08-20 DIAGNOSIS — N186 End stage renal disease: Secondary | ICD-10-CM | POA: Diagnosis not present

## 2016-08-20 DIAGNOSIS — Z79899 Other long term (current) drug therapy: Secondary | ICD-10-CM | POA: Diagnosis not present

## 2016-08-20 DIAGNOSIS — D630 Anemia in neoplastic disease: Secondary | ICD-10-CM | POA: Diagnosis not present

## 2016-08-20 DIAGNOSIS — R8299 Other abnormal findings in urine: Secondary | ICD-10-CM | POA: Diagnosis not present

## 2016-08-20 DIAGNOSIS — D509 Iron deficiency anemia, unspecified: Secondary | ICD-10-CM | POA: Diagnosis not present

## 2016-08-21 ENCOUNTER — Other Ambulatory Visit: Payer: Self-pay | Admitting: Nephrology

## 2016-08-21 DIAGNOSIS — Z79899 Other long term (current) drug therapy: Secondary | ICD-10-CM | POA: Diagnosis not present

## 2016-08-21 DIAGNOSIS — N2589 Other disorders resulting from impaired renal tubular function: Secondary | ICD-10-CM | POA: Diagnosis not present

## 2016-08-21 DIAGNOSIS — D630 Anemia in neoplastic disease: Secondary | ICD-10-CM | POA: Diagnosis not present

## 2016-08-21 DIAGNOSIS — E440 Moderate protein-calorie malnutrition: Secondary | ICD-10-CM | POA: Diagnosis not present

## 2016-08-21 DIAGNOSIS — N186 End stage renal disease: Secondary | ICD-10-CM | POA: Diagnosis not present

## 2016-08-21 DIAGNOSIS — Z7682 Awaiting organ transplant status: Secondary | ICD-10-CM

## 2016-08-21 DIAGNOSIS — D509 Iron deficiency anemia, unspecified: Secondary | ICD-10-CM | POA: Diagnosis not present

## 2016-08-22 DIAGNOSIS — D509 Iron deficiency anemia, unspecified: Secondary | ICD-10-CM | POA: Diagnosis not present

## 2016-08-22 DIAGNOSIS — N2589 Other disorders resulting from impaired renal tubular function: Secondary | ICD-10-CM | POA: Diagnosis not present

## 2016-08-22 DIAGNOSIS — Z79899 Other long term (current) drug therapy: Secondary | ICD-10-CM | POA: Diagnosis not present

## 2016-08-22 DIAGNOSIS — D630 Anemia in neoplastic disease: Secondary | ICD-10-CM | POA: Diagnosis not present

## 2016-08-22 DIAGNOSIS — N186 End stage renal disease: Secondary | ICD-10-CM | POA: Diagnosis not present

## 2016-08-22 DIAGNOSIS — E440 Moderate protein-calorie malnutrition: Secondary | ICD-10-CM | POA: Diagnosis not present

## 2016-08-23 DIAGNOSIS — N186 End stage renal disease: Secondary | ICD-10-CM | POA: Diagnosis not present

## 2016-08-23 DIAGNOSIS — D630 Anemia in neoplastic disease: Secondary | ICD-10-CM | POA: Diagnosis not present

## 2016-08-23 DIAGNOSIS — Z79899 Other long term (current) drug therapy: Secondary | ICD-10-CM | POA: Diagnosis not present

## 2016-08-23 DIAGNOSIS — D509 Iron deficiency anemia, unspecified: Secondary | ICD-10-CM | POA: Diagnosis not present

## 2016-08-23 DIAGNOSIS — E440 Moderate protein-calorie malnutrition: Secondary | ICD-10-CM | POA: Diagnosis not present

## 2016-08-23 DIAGNOSIS — N2589 Other disorders resulting from impaired renal tubular function: Secondary | ICD-10-CM | POA: Diagnosis not present

## 2016-08-24 DIAGNOSIS — Z79899 Other long term (current) drug therapy: Secondary | ICD-10-CM | POA: Diagnosis not present

## 2016-08-24 DIAGNOSIS — E440 Moderate protein-calorie malnutrition: Secondary | ICD-10-CM | POA: Diagnosis not present

## 2016-08-24 DIAGNOSIS — N186 End stage renal disease: Secondary | ICD-10-CM | POA: Diagnosis not present

## 2016-08-24 DIAGNOSIS — N2589 Other disorders resulting from impaired renal tubular function: Secondary | ICD-10-CM | POA: Diagnosis not present

## 2016-08-24 DIAGNOSIS — D509 Iron deficiency anemia, unspecified: Secondary | ICD-10-CM | POA: Diagnosis not present

## 2016-08-24 DIAGNOSIS — D630 Anemia in neoplastic disease: Secondary | ICD-10-CM | POA: Diagnosis not present

## 2016-08-25 DIAGNOSIS — D630 Anemia in neoplastic disease: Secondary | ICD-10-CM | POA: Diagnosis not present

## 2016-08-25 DIAGNOSIS — E440 Moderate protein-calorie malnutrition: Secondary | ICD-10-CM | POA: Diagnosis not present

## 2016-08-25 DIAGNOSIS — Z79899 Other long term (current) drug therapy: Secondary | ICD-10-CM | POA: Diagnosis not present

## 2016-08-25 DIAGNOSIS — N2589 Other disorders resulting from impaired renal tubular function: Secondary | ICD-10-CM | POA: Diagnosis not present

## 2016-08-25 DIAGNOSIS — N186 End stage renal disease: Secondary | ICD-10-CM | POA: Diagnosis not present

## 2016-08-25 DIAGNOSIS — D509 Iron deficiency anemia, unspecified: Secondary | ICD-10-CM | POA: Diagnosis not present

## 2016-08-26 DIAGNOSIS — N2589 Other disorders resulting from impaired renal tubular function: Secondary | ICD-10-CM | POA: Diagnosis not present

## 2016-08-26 DIAGNOSIS — N186 End stage renal disease: Secondary | ICD-10-CM | POA: Diagnosis not present

## 2016-08-26 DIAGNOSIS — D630 Anemia in neoplastic disease: Secondary | ICD-10-CM | POA: Diagnosis not present

## 2016-08-26 DIAGNOSIS — E440 Moderate protein-calorie malnutrition: Secondary | ICD-10-CM | POA: Diagnosis not present

## 2016-08-26 DIAGNOSIS — D509 Iron deficiency anemia, unspecified: Secondary | ICD-10-CM | POA: Diagnosis not present

## 2016-08-26 DIAGNOSIS — Z79899 Other long term (current) drug therapy: Secondary | ICD-10-CM | POA: Diagnosis not present

## 2016-08-27 DIAGNOSIS — D509 Iron deficiency anemia, unspecified: Secondary | ICD-10-CM | POA: Diagnosis not present

## 2016-08-27 DIAGNOSIS — E440 Moderate protein-calorie malnutrition: Secondary | ICD-10-CM | POA: Diagnosis not present

## 2016-08-27 DIAGNOSIS — N2589 Other disorders resulting from impaired renal tubular function: Secondary | ICD-10-CM | POA: Diagnosis not present

## 2016-08-27 DIAGNOSIS — Z79899 Other long term (current) drug therapy: Secondary | ICD-10-CM | POA: Diagnosis not present

## 2016-08-27 DIAGNOSIS — N186 End stage renal disease: Secondary | ICD-10-CM | POA: Diagnosis not present

## 2016-08-27 DIAGNOSIS — D630 Anemia in neoplastic disease: Secondary | ICD-10-CM | POA: Diagnosis not present

## 2016-08-28 DIAGNOSIS — D630 Anemia in neoplastic disease: Secondary | ICD-10-CM | POA: Diagnosis not present

## 2016-08-28 DIAGNOSIS — Z79899 Other long term (current) drug therapy: Secondary | ICD-10-CM | POA: Diagnosis not present

## 2016-08-28 DIAGNOSIS — N186 End stage renal disease: Secondary | ICD-10-CM | POA: Diagnosis not present

## 2016-08-28 DIAGNOSIS — D509 Iron deficiency anemia, unspecified: Secondary | ICD-10-CM | POA: Diagnosis not present

## 2016-08-28 DIAGNOSIS — N2589 Other disorders resulting from impaired renal tubular function: Secondary | ICD-10-CM | POA: Diagnosis not present

## 2016-08-28 DIAGNOSIS — E440 Moderate protein-calorie malnutrition: Secondary | ICD-10-CM | POA: Diagnosis not present

## 2016-08-29 ENCOUNTER — Ambulatory Visit
Admission: RE | Admit: 2016-08-29 | Discharge: 2016-08-29 | Disposition: A | Discharge status: Home / Self Care | Payer: Medicare Other | Source: Ambulatory Visit | Attending: Nephrology | Admitting: Nephrology

## 2016-08-29 ENCOUNTER — Other Ambulatory Visit: Payer: Medicare Other

## 2016-08-29 DIAGNOSIS — Z7682 Awaiting organ transplant status: Secondary | ICD-10-CM

## 2016-08-29 DIAGNOSIS — N2589 Other disorders resulting from impaired renal tubular function: Secondary | ICD-10-CM | POA: Diagnosis not present

## 2016-08-29 DIAGNOSIS — D630 Anemia in neoplastic disease: Secondary | ICD-10-CM | POA: Diagnosis not present

## 2016-08-29 DIAGNOSIS — D509 Iron deficiency anemia, unspecified: Secondary | ICD-10-CM | POA: Diagnosis not present

## 2016-08-29 DIAGNOSIS — E440 Moderate protein-calorie malnutrition: Secondary | ICD-10-CM | POA: Diagnosis not present

## 2016-08-29 DIAGNOSIS — R922 Inconclusive mammogram: Secondary | ICD-10-CM | POA: Diagnosis not present

## 2016-08-29 DIAGNOSIS — N6489 Other specified disorders of breast: Secondary | ICD-10-CM | POA: Diagnosis not present

## 2016-08-29 DIAGNOSIS — Z79899 Other long term (current) drug therapy: Secondary | ICD-10-CM | POA: Diagnosis not present

## 2016-08-29 DIAGNOSIS — N186 End stage renal disease: Secondary | ICD-10-CM | POA: Diagnosis not present

## 2016-08-30 DIAGNOSIS — E440 Moderate protein-calorie malnutrition: Secondary | ICD-10-CM | POA: Diagnosis not present

## 2016-08-30 DIAGNOSIS — D630 Anemia in neoplastic disease: Secondary | ICD-10-CM | POA: Diagnosis not present

## 2016-08-30 DIAGNOSIS — D509 Iron deficiency anemia, unspecified: Secondary | ICD-10-CM | POA: Diagnosis not present

## 2016-08-30 DIAGNOSIS — Z79899 Other long term (current) drug therapy: Secondary | ICD-10-CM | POA: Diagnosis not present

## 2016-08-30 DIAGNOSIS — N186 End stage renal disease: Secondary | ICD-10-CM | POA: Diagnosis not present

## 2016-08-30 DIAGNOSIS — N2589 Other disorders resulting from impaired renal tubular function: Secondary | ICD-10-CM | POA: Diagnosis not present

## 2016-08-31 DIAGNOSIS — D630 Anemia in neoplastic disease: Secondary | ICD-10-CM | POA: Diagnosis not present

## 2016-08-31 DIAGNOSIS — E440 Moderate protein-calorie malnutrition: Secondary | ICD-10-CM | POA: Diagnosis not present

## 2016-08-31 DIAGNOSIS — N2589 Other disorders resulting from impaired renal tubular function: Secondary | ICD-10-CM | POA: Diagnosis not present

## 2016-08-31 DIAGNOSIS — D509 Iron deficiency anemia, unspecified: Secondary | ICD-10-CM | POA: Diagnosis not present

## 2016-08-31 DIAGNOSIS — Z79899 Other long term (current) drug therapy: Secondary | ICD-10-CM | POA: Diagnosis not present

## 2016-08-31 DIAGNOSIS — N186 End stage renal disease: Secondary | ICD-10-CM | POA: Diagnosis not present

## 2016-09-01 DIAGNOSIS — E440 Moderate protein-calorie malnutrition: Secondary | ICD-10-CM | POA: Diagnosis not present

## 2016-09-01 DIAGNOSIS — N2589 Other disorders resulting from impaired renal tubular function: Secondary | ICD-10-CM | POA: Diagnosis not present

## 2016-09-01 DIAGNOSIS — Z79899 Other long term (current) drug therapy: Secondary | ICD-10-CM | POA: Diagnosis not present

## 2016-09-01 DIAGNOSIS — D630 Anemia in neoplastic disease: Secondary | ICD-10-CM | POA: Diagnosis not present

## 2016-09-01 DIAGNOSIS — N186 End stage renal disease: Secondary | ICD-10-CM | POA: Diagnosis not present

## 2016-09-01 DIAGNOSIS — D509 Iron deficiency anemia, unspecified: Secondary | ICD-10-CM | POA: Diagnosis not present

## 2016-09-02 DIAGNOSIS — N186 End stage renal disease: Secondary | ICD-10-CM | POA: Diagnosis not present

## 2016-09-02 DIAGNOSIS — D630 Anemia in neoplastic disease: Secondary | ICD-10-CM | POA: Diagnosis not present

## 2016-09-02 DIAGNOSIS — N2589 Other disorders resulting from impaired renal tubular function: Secondary | ICD-10-CM | POA: Diagnosis not present

## 2016-09-02 DIAGNOSIS — D509 Iron deficiency anemia, unspecified: Secondary | ICD-10-CM | POA: Diagnosis not present

## 2016-09-02 DIAGNOSIS — E440 Moderate protein-calorie malnutrition: Secondary | ICD-10-CM | POA: Diagnosis not present

## 2016-09-02 DIAGNOSIS — Z79899 Other long term (current) drug therapy: Secondary | ICD-10-CM | POA: Diagnosis not present

## 2016-09-03 DIAGNOSIS — Z79899 Other long term (current) drug therapy: Secondary | ICD-10-CM | POA: Diagnosis not present

## 2016-09-03 DIAGNOSIS — N186 End stage renal disease: Secondary | ICD-10-CM | POA: Diagnosis not present

## 2016-09-03 DIAGNOSIS — N2589 Other disorders resulting from impaired renal tubular function: Secondary | ICD-10-CM | POA: Diagnosis not present

## 2016-09-03 DIAGNOSIS — E440 Moderate protein-calorie malnutrition: Secondary | ICD-10-CM | POA: Diagnosis not present

## 2016-09-03 DIAGNOSIS — D509 Iron deficiency anemia, unspecified: Secondary | ICD-10-CM | POA: Diagnosis not present

## 2016-09-03 DIAGNOSIS — D630 Anemia in neoplastic disease: Secondary | ICD-10-CM | POA: Diagnosis not present

## 2016-09-04 DIAGNOSIS — D630 Anemia in neoplastic disease: Secondary | ICD-10-CM | POA: Diagnosis not present

## 2016-09-04 DIAGNOSIS — N186 End stage renal disease: Secondary | ICD-10-CM | POA: Diagnosis not present

## 2016-09-04 DIAGNOSIS — Z79899 Other long term (current) drug therapy: Secondary | ICD-10-CM | POA: Diagnosis not present

## 2016-09-04 DIAGNOSIS — N2589 Other disorders resulting from impaired renal tubular function: Secondary | ICD-10-CM | POA: Diagnosis not present

## 2016-09-04 DIAGNOSIS — D509 Iron deficiency anemia, unspecified: Secondary | ICD-10-CM | POA: Diagnosis not present

## 2016-09-04 DIAGNOSIS — E440 Moderate protein-calorie malnutrition: Secondary | ICD-10-CM | POA: Diagnosis not present

## 2016-09-05 DIAGNOSIS — D509 Iron deficiency anemia, unspecified: Secondary | ICD-10-CM | POA: Diagnosis not present

## 2016-09-05 DIAGNOSIS — D630 Anemia in neoplastic disease: Secondary | ICD-10-CM | POA: Diagnosis not present

## 2016-09-05 DIAGNOSIS — N186 End stage renal disease: Secondary | ICD-10-CM | POA: Diagnosis not present

## 2016-09-05 DIAGNOSIS — Z79899 Other long term (current) drug therapy: Secondary | ICD-10-CM | POA: Diagnosis not present

## 2016-09-05 DIAGNOSIS — N2589 Other disorders resulting from impaired renal tubular function: Secondary | ICD-10-CM | POA: Diagnosis not present

## 2016-09-05 DIAGNOSIS — E440 Moderate protein-calorie malnutrition: Secondary | ICD-10-CM | POA: Diagnosis not present

## 2016-09-06 DIAGNOSIS — D630 Anemia in neoplastic disease: Secondary | ICD-10-CM | POA: Diagnosis not present

## 2016-09-06 DIAGNOSIS — N2589 Other disorders resulting from impaired renal tubular function: Secondary | ICD-10-CM | POA: Diagnosis not present

## 2016-09-06 DIAGNOSIS — N186 End stage renal disease: Secondary | ICD-10-CM | POA: Diagnosis not present

## 2016-09-06 DIAGNOSIS — D509 Iron deficiency anemia, unspecified: Secondary | ICD-10-CM | POA: Diagnosis not present

## 2016-09-06 DIAGNOSIS — E440 Moderate protein-calorie malnutrition: Secondary | ICD-10-CM | POA: Diagnosis not present

## 2016-09-06 DIAGNOSIS — Z79899 Other long term (current) drug therapy: Secondary | ICD-10-CM | POA: Diagnosis not present

## 2016-09-07 DIAGNOSIS — N186 End stage renal disease: Secondary | ICD-10-CM | POA: Diagnosis not present

## 2016-09-07 DIAGNOSIS — D630 Anemia in neoplastic disease: Secondary | ICD-10-CM | POA: Diagnosis not present

## 2016-09-07 DIAGNOSIS — Z79899 Other long term (current) drug therapy: Secondary | ICD-10-CM | POA: Diagnosis not present

## 2016-09-07 DIAGNOSIS — N2589 Other disorders resulting from impaired renal tubular function: Secondary | ICD-10-CM | POA: Diagnosis not present

## 2016-09-07 DIAGNOSIS — E440 Moderate protein-calorie malnutrition: Secondary | ICD-10-CM | POA: Diagnosis not present

## 2016-09-07 DIAGNOSIS — D509 Iron deficiency anemia, unspecified: Secondary | ICD-10-CM | POA: Diagnosis not present

## 2016-09-08 DIAGNOSIS — D509 Iron deficiency anemia, unspecified: Secondary | ICD-10-CM | POA: Diagnosis not present

## 2016-09-08 DIAGNOSIS — N2589 Other disorders resulting from impaired renal tubular function: Secondary | ICD-10-CM | POA: Diagnosis not present

## 2016-09-08 DIAGNOSIS — D630 Anemia in neoplastic disease: Secondary | ICD-10-CM | POA: Diagnosis not present

## 2016-09-08 DIAGNOSIS — N186 End stage renal disease: Secondary | ICD-10-CM | POA: Diagnosis not present

## 2016-09-08 DIAGNOSIS — E440 Moderate protein-calorie malnutrition: Secondary | ICD-10-CM | POA: Diagnosis not present

## 2016-09-08 DIAGNOSIS — Z79899 Other long term (current) drug therapy: Secondary | ICD-10-CM | POA: Diagnosis not present

## 2016-09-09 DIAGNOSIS — E440 Moderate protein-calorie malnutrition: Secondary | ICD-10-CM | POA: Diagnosis not present

## 2016-09-09 DIAGNOSIS — N186 End stage renal disease: Secondary | ICD-10-CM | POA: Diagnosis not present

## 2016-09-09 DIAGNOSIS — N2589 Other disorders resulting from impaired renal tubular function: Secondary | ICD-10-CM | POA: Diagnosis not present

## 2016-09-09 DIAGNOSIS — D630 Anemia in neoplastic disease: Secondary | ICD-10-CM | POA: Diagnosis not present

## 2016-09-09 DIAGNOSIS — Z79899 Other long term (current) drug therapy: Secondary | ICD-10-CM | POA: Diagnosis not present

## 2016-09-09 DIAGNOSIS — D509 Iron deficiency anemia, unspecified: Secondary | ICD-10-CM | POA: Diagnosis not present

## 2016-09-10 DIAGNOSIS — Z79899 Other long term (current) drug therapy: Secondary | ICD-10-CM | POA: Diagnosis not present

## 2016-09-10 DIAGNOSIS — N186 End stage renal disease: Secondary | ICD-10-CM | POA: Diagnosis not present

## 2016-09-10 DIAGNOSIS — E440 Moderate protein-calorie malnutrition: Secondary | ICD-10-CM | POA: Diagnosis not present

## 2016-09-10 DIAGNOSIS — D630 Anemia in neoplastic disease: Secondary | ICD-10-CM | POA: Diagnosis not present

## 2016-09-10 DIAGNOSIS — N2589 Other disorders resulting from impaired renal tubular function: Secondary | ICD-10-CM | POA: Diagnosis not present

## 2016-09-10 DIAGNOSIS — D509 Iron deficiency anemia, unspecified: Secondary | ICD-10-CM | POA: Diagnosis not present

## 2016-09-11 DIAGNOSIS — D630 Anemia in neoplastic disease: Secondary | ICD-10-CM | POA: Diagnosis not present

## 2016-09-11 DIAGNOSIS — N186 End stage renal disease: Secondary | ICD-10-CM | POA: Diagnosis not present

## 2016-09-11 DIAGNOSIS — N2589 Other disorders resulting from impaired renal tubular function: Secondary | ICD-10-CM | POA: Diagnosis not present

## 2016-09-11 DIAGNOSIS — Z79899 Other long term (current) drug therapy: Secondary | ICD-10-CM | POA: Diagnosis not present

## 2016-09-11 DIAGNOSIS — D509 Iron deficiency anemia, unspecified: Secondary | ICD-10-CM | POA: Diagnosis not present

## 2016-09-11 DIAGNOSIS — E440 Moderate protein-calorie malnutrition: Secondary | ICD-10-CM | POA: Diagnosis not present

## 2016-09-12 DIAGNOSIS — Z79899 Other long term (current) drug therapy: Secondary | ICD-10-CM | POA: Diagnosis not present

## 2016-09-12 DIAGNOSIS — D509 Iron deficiency anemia, unspecified: Secondary | ICD-10-CM | POA: Diagnosis not present

## 2016-09-12 DIAGNOSIS — D630 Anemia in neoplastic disease: Secondary | ICD-10-CM | POA: Diagnosis not present

## 2016-09-12 DIAGNOSIS — N186 End stage renal disease: Secondary | ICD-10-CM | POA: Diagnosis not present

## 2016-09-12 DIAGNOSIS — E440 Moderate protein-calorie malnutrition: Secondary | ICD-10-CM | POA: Diagnosis not present

## 2016-09-12 DIAGNOSIS — N2589 Other disorders resulting from impaired renal tubular function: Secondary | ICD-10-CM | POA: Diagnosis not present

## 2016-09-13 DIAGNOSIS — Z992 Dependence on renal dialysis: Secondary | ICD-10-CM | POA: Diagnosis not present

## 2016-09-13 DIAGNOSIS — Z79899 Other long term (current) drug therapy: Secondary | ICD-10-CM | POA: Diagnosis not present

## 2016-09-13 DIAGNOSIS — N186 End stage renal disease: Secondary | ICD-10-CM | POA: Diagnosis not present

## 2016-09-13 DIAGNOSIS — D509 Iron deficiency anemia, unspecified: Secondary | ICD-10-CM | POA: Diagnosis not present

## 2016-09-13 DIAGNOSIS — E440 Moderate protein-calorie malnutrition: Secondary | ICD-10-CM | POA: Diagnosis not present

## 2016-09-13 DIAGNOSIS — D630 Anemia in neoplastic disease: Secondary | ICD-10-CM | POA: Diagnosis not present

## 2016-09-13 DIAGNOSIS — N2589 Other disorders resulting from impaired renal tubular function: Secondary | ICD-10-CM | POA: Diagnosis not present

## 2016-09-14 DIAGNOSIS — Z23 Encounter for immunization: Secondary | ICD-10-CM | POA: Diagnosis not present

## 2016-09-14 DIAGNOSIS — N2581 Secondary hyperparathyroidism of renal origin: Secondary | ICD-10-CM | POA: Diagnosis not present

## 2016-09-14 DIAGNOSIS — D630 Anemia in neoplastic disease: Secondary | ICD-10-CM | POA: Diagnosis not present

## 2016-09-14 DIAGNOSIS — D509 Iron deficiency anemia, unspecified: Secondary | ICD-10-CM | POA: Diagnosis not present

## 2016-09-14 DIAGNOSIS — N2589 Other disorders resulting from impaired renal tubular function: Secondary | ICD-10-CM | POA: Diagnosis not present

## 2016-09-14 DIAGNOSIS — N186 End stage renal disease: Secondary | ICD-10-CM | POA: Diagnosis not present

## 2016-09-14 DIAGNOSIS — D631 Anemia in chronic kidney disease: Secondary | ICD-10-CM | POA: Diagnosis not present

## 2016-09-15 DIAGNOSIS — D630 Anemia in neoplastic disease: Secondary | ICD-10-CM | POA: Diagnosis not present

## 2016-09-15 DIAGNOSIS — N186 End stage renal disease: Secondary | ICD-10-CM | POA: Diagnosis not present

## 2016-09-15 DIAGNOSIS — D509 Iron deficiency anemia, unspecified: Secondary | ICD-10-CM | POA: Diagnosis not present

## 2016-09-15 DIAGNOSIS — D631 Anemia in chronic kidney disease: Secondary | ICD-10-CM | POA: Diagnosis not present

## 2016-09-15 DIAGNOSIS — N2581 Secondary hyperparathyroidism of renal origin: Secondary | ICD-10-CM | POA: Diagnosis not present

## 2016-09-15 DIAGNOSIS — N2589 Other disorders resulting from impaired renal tubular function: Secondary | ICD-10-CM | POA: Diagnosis not present

## 2016-09-16 DIAGNOSIS — N2581 Secondary hyperparathyroidism of renal origin: Secondary | ICD-10-CM | POA: Diagnosis not present

## 2016-09-16 DIAGNOSIS — D630 Anemia in neoplastic disease: Secondary | ICD-10-CM | POA: Diagnosis not present

## 2016-09-16 DIAGNOSIS — D631 Anemia in chronic kidney disease: Secondary | ICD-10-CM | POA: Diagnosis not present

## 2016-09-16 DIAGNOSIS — N186 End stage renal disease: Secondary | ICD-10-CM | POA: Diagnosis not present

## 2016-09-16 DIAGNOSIS — N2589 Other disorders resulting from impaired renal tubular function: Secondary | ICD-10-CM | POA: Diagnosis not present

## 2016-09-16 DIAGNOSIS — D509 Iron deficiency anemia, unspecified: Secondary | ICD-10-CM | POA: Diagnosis not present

## 2016-09-17 DIAGNOSIS — D630 Anemia in neoplastic disease: Secondary | ICD-10-CM | POA: Diagnosis not present

## 2016-09-17 DIAGNOSIS — D509 Iron deficiency anemia, unspecified: Secondary | ICD-10-CM | POA: Diagnosis not present

## 2016-09-17 DIAGNOSIS — D631 Anemia in chronic kidney disease: Secondary | ICD-10-CM | POA: Diagnosis not present

## 2016-09-17 DIAGNOSIS — N2589 Other disorders resulting from impaired renal tubular function: Secondary | ICD-10-CM | POA: Diagnosis not present

## 2016-09-17 DIAGNOSIS — N186 End stage renal disease: Secondary | ICD-10-CM | POA: Diagnosis not present

## 2016-09-17 DIAGNOSIS — N2581 Secondary hyperparathyroidism of renal origin: Secondary | ICD-10-CM | POA: Diagnosis not present

## 2016-09-18 DIAGNOSIS — D509 Iron deficiency anemia, unspecified: Secondary | ICD-10-CM | POA: Diagnosis not present

## 2016-09-18 DIAGNOSIS — N2589 Other disorders resulting from impaired renal tubular function: Secondary | ICD-10-CM | POA: Diagnosis not present

## 2016-09-18 DIAGNOSIS — N2581 Secondary hyperparathyroidism of renal origin: Secondary | ICD-10-CM | POA: Diagnosis not present

## 2016-09-18 DIAGNOSIS — N186 End stage renal disease: Secondary | ICD-10-CM | POA: Diagnosis not present

## 2016-09-18 DIAGNOSIS — D631 Anemia in chronic kidney disease: Secondary | ICD-10-CM | POA: Diagnosis not present

## 2016-09-18 DIAGNOSIS — D630 Anemia in neoplastic disease: Secondary | ICD-10-CM | POA: Diagnosis not present

## 2016-09-19 DIAGNOSIS — N2589 Other disorders resulting from impaired renal tubular function: Secondary | ICD-10-CM | POA: Diagnosis not present

## 2016-09-19 DIAGNOSIS — D630 Anemia in neoplastic disease: Secondary | ICD-10-CM | POA: Diagnosis not present

## 2016-09-19 DIAGNOSIS — D509 Iron deficiency anemia, unspecified: Secondary | ICD-10-CM | POA: Diagnosis not present

## 2016-09-19 DIAGNOSIS — N186 End stage renal disease: Secondary | ICD-10-CM | POA: Diagnosis not present

## 2016-09-19 DIAGNOSIS — N2581 Secondary hyperparathyroidism of renal origin: Secondary | ICD-10-CM | POA: Diagnosis not present

## 2016-09-19 DIAGNOSIS — D631 Anemia in chronic kidney disease: Secondary | ICD-10-CM | POA: Diagnosis not present

## 2016-09-20 DIAGNOSIS — D630 Anemia in neoplastic disease: Secondary | ICD-10-CM | POA: Diagnosis not present

## 2016-09-20 DIAGNOSIS — N186 End stage renal disease: Secondary | ICD-10-CM | POA: Diagnosis not present

## 2016-09-20 DIAGNOSIS — N2589 Other disorders resulting from impaired renal tubular function: Secondary | ICD-10-CM | POA: Diagnosis not present

## 2016-09-20 DIAGNOSIS — N2581 Secondary hyperparathyroidism of renal origin: Secondary | ICD-10-CM | POA: Diagnosis not present

## 2016-09-20 DIAGNOSIS — D631 Anemia in chronic kidney disease: Secondary | ICD-10-CM | POA: Diagnosis not present

## 2016-09-20 DIAGNOSIS — D509 Iron deficiency anemia, unspecified: Secondary | ICD-10-CM | POA: Diagnosis not present

## 2016-09-21 DIAGNOSIS — N2581 Secondary hyperparathyroidism of renal origin: Secondary | ICD-10-CM | POA: Diagnosis not present

## 2016-09-21 DIAGNOSIS — D631 Anemia in chronic kidney disease: Secondary | ICD-10-CM | POA: Diagnosis not present

## 2016-09-21 DIAGNOSIS — D630 Anemia in neoplastic disease: Secondary | ICD-10-CM | POA: Diagnosis not present

## 2016-09-21 DIAGNOSIS — N2589 Other disorders resulting from impaired renal tubular function: Secondary | ICD-10-CM | POA: Diagnosis not present

## 2016-09-21 DIAGNOSIS — D509 Iron deficiency anemia, unspecified: Secondary | ICD-10-CM | POA: Diagnosis not present

## 2016-09-21 DIAGNOSIS — N186 End stage renal disease: Secondary | ICD-10-CM | POA: Diagnosis not present

## 2016-09-22 DIAGNOSIS — N186 End stage renal disease: Secondary | ICD-10-CM | POA: Diagnosis not present

## 2016-09-22 DIAGNOSIS — D631 Anemia in chronic kidney disease: Secondary | ICD-10-CM | POA: Diagnosis not present

## 2016-09-22 DIAGNOSIS — D630 Anemia in neoplastic disease: Secondary | ICD-10-CM | POA: Diagnosis not present

## 2016-09-22 DIAGNOSIS — N2581 Secondary hyperparathyroidism of renal origin: Secondary | ICD-10-CM | POA: Diagnosis not present

## 2016-09-22 DIAGNOSIS — D509 Iron deficiency anemia, unspecified: Secondary | ICD-10-CM | POA: Diagnosis not present

## 2016-09-22 DIAGNOSIS — N2589 Other disorders resulting from impaired renal tubular function: Secondary | ICD-10-CM | POA: Diagnosis not present

## 2016-09-23 DIAGNOSIS — N186 End stage renal disease: Secondary | ICD-10-CM | POA: Diagnosis not present

## 2016-09-23 DIAGNOSIS — D630 Anemia in neoplastic disease: Secondary | ICD-10-CM | POA: Diagnosis not present

## 2016-09-23 DIAGNOSIS — D631 Anemia in chronic kidney disease: Secondary | ICD-10-CM | POA: Diagnosis not present

## 2016-09-23 DIAGNOSIS — D509 Iron deficiency anemia, unspecified: Secondary | ICD-10-CM | POA: Diagnosis not present

## 2016-09-23 DIAGNOSIS — N2589 Other disorders resulting from impaired renal tubular function: Secondary | ICD-10-CM | POA: Diagnosis not present

## 2016-09-23 DIAGNOSIS — N2581 Secondary hyperparathyroidism of renal origin: Secondary | ICD-10-CM | POA: Diagnosis not present

## 2016-09-24 DIAGNOSIS — D509 Iron deficiency anemia, unspecified: Secondary | ICD-10-CM | POA: Diagnosis not present

## 2016-09-24 DIAGNOSIS — N2589 Other disorders resulting from impaired renal tubular function: Secondary | ICD-10-CM | POA: Diagnosis not present

## 2016-09-24 DIAGNOSIS — N186 End stage renal disease: Secondary | ICD-10-CM | POA: Diagnosis not present

## 2016-09-24 DIAGNOSIS — D630 Anemia in neoplastic disease: Secondary | ICD-10-CM | POA: Diagnosis not present

## 2016-09-24 DIAGNOSIS — N2581 Secondary hyperparathyroidism of renal origin: Secondary | ICD-10-CM | POA: Diagnosis not present

## 2016-09-24 DIAGNOSIS — D631 Anemia in chronic kidney disease: Secondary | ICD-10-CM | POA: Diagnosis not present

## 2016-09-25 DIAGNOSIS — N2581 Secondary hyperparathyroidism of renal origin: Secondary | ICD-10-CM | POA: Diagnosis not present

## 2016-09-25 DIAGNOSIS — N2589 Other disorders resulting from impaired renal tubular function: Secondary | ICD-10-CM | POA: Diagnosis not present

## 2016-09-25 DIAGNOSIS — D631 Anemia in chronic kidney disease: Secondary | ICD-10-CM | POA: Diagnosis not present

## 2016-09-25 DIAGNOSIS — N186 End stage renal disease: Secondary | ICD-10-CM | POA: Diagnosis not present

## 2016-09-25 DIAGNOSIS — D509 Iron deficiency anemia, unspecified: Secondary | ICD-10-CM | POA: Diagnosis not present

## 2016-09-25 DIAGNOSIS — D630 Anemia in neoplastic disease: Secondary | ICD-10-CM | POA: Diagnosis not present

## 2016-09-26 DIAGNOSIS — D631 Anemia in chronic kidney disease: Secondary | ICD-10-CM | POA: Diagnosis not present

## 2016-09-26 DIAGNOSIS — N186 End stage renal disease: Secondary | ICD-10-CM | POA: Diagnosis not present

## 2016-09-26 DIAGNOSIS — D630 Anemia in neoplastic disease: Secondary | ICD-10-CM | POA: Diagnosis not present

## 2016-09-26 DIAGNOSIS — N2589 Other disorders resulting from impaired renal tubular function: Secondary | ICD-10-CM | POA: Diagnosis not present

## 2016-09-26 DIAGNOSIS — N2581 Secondary hyperparathyroidism of renal origin: Secondary | ICD-10-CM | POA: Diagnosis not present

## 2016-09-26 DIAGNOSIS — D509 Iron deficiency anemia, unspecified: Secondary | ICD-10-CM | POA: Diagnosis not present

## 2016-09-27 DIAGNOSIS — N186 End stage renal disease: Secondary | ICD-10-CM | POA: Diagnosis not present

## 2016-09-27 DIAGNOSIS — D630 Anemia in neoplastic disease: Secondary | ICD-10-CM | POA: Diagnosis not present

## 2016-09-27 DIAGNOSIS — D631 Anemia in chronic kidney disease: Secondary | ICD-10-CM | POA: Diagnosis not present

## 2016-09-27 DIAGNOSIS — N2581 Secondary hyperparathyroidism of renal origin: Secondary | ICD-10-CM | POA: Diagnosis not present

## 2016-09-27 DIAGNOSIS — N2589 Other disorders resulting from impaired renal tubular function: Secondary | ICD-10-CM | POA: Diagnosis not present

## 2016-09-27 DIAGNOSIS — D509 Iron deficiency anemia, unspecified: Secondary | ICD-10-CM | POA: Diagnosis not present

## 2016-09-28 DIAGNOSIS — D630 Anemia in neoplastic disease: Secondary | ICD-10-CM | POA: Diagnosis not present

## 2016-09-28 DIAGNOSIS — N2581 Secondary hyperparathyroidism of renal origin: Secondary | ICD-10-CM | POA: Diagnosis not present

## 2016-09-28 DIAGNOSIS — D631 Anemia in chronic kidney disease: Secondary | ICD-10-CM | POA: Diagnosis not present

## 2016-09-28 DIAGNOSIS — N2589 Other disorders resulting from impaired renal tubular function: Secondary | ICD-10-CM | POA: Diagnosis not present

## 2016-09-28 DIAGNOSIS — N186 End stage renal disease: Secondary | ICD-10-CM | POA: Diagnosis not present

## 2016-09-28 DIAGNOSIS — D509 Iron deficiency anemia, unspecified: Secondary | ICD-10-CM | POA: Diagnosis not present

## 2016-09-29 DIAGNOSIS — D630 Anemia in neoplastic disease: Secondary | ICD-10-CM | POA: Diagnosis not present

## 2016-09-29 DIAGNOSIS — D509 Iron deficiency anemia, unspecified: Secondary | ICD-10-CM | POA: Diagnosis not present

## 2016-09-29 DIAGNOSIS — N2581 Secondary hyperparathyroidism of renal origin: Secondary | ICD-10-CM | POA: Diagnosis not present

## 2016-09-29 DIAGNOSIS — N186 End stage renal disease: Secondary | ICD-10-CM | POA: Diagnosis not present

## 2016-09-29 DIAGNOSIS — N2589 Other disorders resulting from impaired renal tubular function: Secondary | ICD-10-CM | POA: Diagnosis not present

## 2016-09-29 DIAGNOSIS — D631 Anemia in chronic kidney disease: Secondary | ICD-10-CM | POA: Diagnosis not present

## 2016-09-30 DIAGNOSIS — N2581 Secondary hyperparathyroidism of renal origin: Secondary | ICD-10-CM | POA: Diagnosis not present

## 2016-09-30 DIAGNOSIS — D630 Anemia in neoplastic disease: Secondary | ICD-10-CM | POA: Diagnosis not present

## 2016-09-30 DIAGNOSIS — D631 Anemia in chronic kidney disease: Secondary | ICD-10-CM | POA: Diagnosis not present

## 2016-09-30 DIAGNOSIS — N186 End stage renal disease: Secondary | ICD-10-CM | POA: Diagnosis not present

## 2016-09-30 DIAGNOSIS — D509 Iron deficiency anemia, unspecified: Secondary | ICD-10-CM | POA: Diagnosis not present

## 2016-09-30 DIAGNOSIS — N2589 Other disorders resulting from impaired renal tubular function: Secondary | ICD-10-CM | POA: Diagnosis not present

## 2016-10-01 DIAGNOSIS — D630 Anemia in neoplastic disease: Secondary | ICD-10-CM | POA: Diagnosis not present

## 2016-10-01 DIAGNOSIS — N186 End stage renal disease: Secondary | ICD-10-CM | POA: Diagnosis not present

## 2016-10-01 DIAGNOSIS — N2589 Other disorders resulting from impaired renal tubular function: Secondary | ICD-10-CM | POA: Diagnosis not present

## 2016-10-01 DIAGNOSIS — N2581 Secondary hyperparathyroidism of renal origin: Secondary | ICD-10-CM | POA: Diagnosis not present

## 2016-10-01 DIAGNOSIS — D509 Iron deficiency anemia, unspecified: Secondary | ICD-10-CM | POA: Diagnosis not present

## 2016-10-01 DIAGNOSIS — D631 Anemia in chronic kidney disease: Secondary | ICD-10-CM | POA: Diagnosis not present

## 2016-10-02 DIAGNOSIS — D631 Anemia in chronic kidney disease: Secondary | ICD-10-CM | POA: Diagnosis not present

## 2016-10-02 DIAGNOSIS — N186 End stage renal disease: Secondary | ICD-10-CM | POA: Diagnosis not present

## 2016-10-02 DIAGNOSIS — D630 Anemia in neoplastic disease: Secondary | ICD-10-CM | POA: Diagnosis not present

## 2016-10-02 DIAGNOSIS — N2589 Other disorders resulting from impaired renal tubular function: Secondary | ICD-10-CM | POA: Diagnosis not present

## 2016-10-02 DIAGNOSIS — D509 Iron deficiency anemia, unspecified: Secondary | ICD-10-CM | POA: Diagnosis not present

## 2016-10-02 DIAGNOSIS — E784 Other hyperlipidemia: Secondary | ICD-10-CM | POA: Diagnosis not present

## 2016-10-02 DIAGNOSIS — R8299 Other abnormal findings in urine: Secondary | ICD-10-CM | POA: Diagnosis not present

## 2016-10-02 DIAGNOSIS — N2581 Secondary hyperparathyroidism of renal origin: Secondary | ICD-10-CM | POA: Diagnosis not present

## 2016-10-03 DIAGNOSIS — D630 Anemia in neoplastic disease: Secondary | ICD-10-CM | POA: Diagnosis not present

## 2016-10-03 DIAGNOSIS — N186 End stage renal disease: Secondary | ICD-10-CM | POA: Diagnosis not present

## 2016-10-03 DIAGNOSIS — N2581 Secondary hyperparathyroidism of renal origin: Secondary | ICD-10-CM | POA: Diagnosis not present

## 2016-10-03 DIAGNOSIS — D631 Anemia in chronic kidney disease: Secondary | ICD-10-CM | POA: Diagnosis not present

## 2016-10-03 DIAGNOSIS — D509 Iron deficiency anemia, unspecified: Secondary | ICD-10-CM | POA: Diagnosis not present

## 2016-10-03 DIAGNOSIS — N2589 Other disorders resulting from impaired renal tubular function: Secondary | ICD-10-CM | POA: Diagnosis not present

## 2016-10-04 DIAGNOSIS — N186 End stage renal disease: Secondary | ICD-10-CM | POA: Diagnosis not present

## 2016-10-04 DIAGNOSIS — N2581 Secondary hyperparathyroidism of renal origin: Secondary | ICD-10-CM | POA: Diagnosis not present

## 2016-10-04 DIAGNOSIS — D631 Anemia in chronic kidney disease: Secondary | ICD-10-CM | POA: Diagnosis not present

## 2016-10-04 DIAGNOSIS — D509 Iron deficiency anemia, unspecified: Secondary | ICD-10-CM | POA: Diagnosis not present

## 2016-10-04 DIAGNOSIS — D630 Anemia in neoplastic disease: Secondary | ICD-10-CM | POA: Diagnosis not present

## 2016-10-04 DIAGNOSIS — N2589 Other disorders resulting from impaired renal tubular function: Secondary | ICD-10-CM | POA: Diagnosis not present

## 2016-10-05 DIAGNOSIS — D631 Anemia in chronic kidney disease: Secondary | ICD-10-CM | POA: Diagnosis not present

## 2016-10-05 DIAGNOSIS — N2581 Secondary hyperparathyroidism of renal origin: Secondary | ICD-10-CM | POA: Diagnosis not present

## 2016-10-05 DIAGNOSIS — N2589 Other disorders resulting from impaired renal tubular function: Secondary | ICD-10-CM | POA: Diagnosis not present

## 2016-10-05 DIAGNOSIS — N186 End stage renal disease: Secondary | ICD-10-CM | POA: Diagnosis not present

## 2016-10-05 DIAGNOSIS — D630 Anemia in neoplastic disease: Secondary | ICD-10-CM | POA: Diagnosis not present

## 2016-10-05 DIAGNOSIS — D509 Iron deficiency anemia, unspecified: Secondary | ICD-10-CM | POA: Diagnosis not present

## 2016-10-06 DIAGNOSIS — N186 End stage renal disease: Secondary | ICD-10-CM | POA: Diagnosis not present

## 2016-10-06 DIAGNOSIS — N2589 Other disorders resulting from impaired renal tubular function: Secondary | ICD-10-CM | POA: Diagnosis not present

## 2016-10-06 DIAGNOSIS — N2581 Secondary hyperparathyroidism of renal origin: Secondary | ICD-10-CM | POA: Diagnosis not present

## 2016-10-06 DIAGNOSIS — D630 Anemia in neoplastic disease: Secondary | ICD-10-CM | POA: Diagnosis not present

## 2016-10-06 DIAGNOSIS — D631 Anemia in chronic kidney disease: Secondary | ICD-10-CM | POA: Diagnosis not present

## 2016-10-06 DIAGNOSIS — D509 Iron deficiency anemia, unspecified: Secondary | ICD-10-CM | POA: Diagnosis not present

## 2016-10-07 DIAGNOSIS — N2589 Other disorders resulting from impaired renal tubular function: Secondary | ICD-10-CM | POA: Diagnosis not present

## 2016-10-07 DIAGNOSIS — N2581 Secondary hyperparathyroidism of renal origin: Secondary | ICD-10-CM | POA: Diagnosis not present

## 2016-10-07 DIAGNOSIS — D631 Anemia in chronic kidney disease: Secondary | ICD-10-CM | POA: Diagnosis not present

## 2016-10-07 DIAGNOSIS — N186 End stage renal disease: Secondary | ICD-10-CM | POA: Diagnosis not present

## 2016-10-07 DIAGNOSIS — D509 Iron deficiency anemia, unspecified: Secondary | ICD-10-CM | POA: Diagnosis not present

## 2016-10-07 DIAGNOSIS — D630 Anemia in neoplastic disease: Secondary | ICD-10-CM | POA: Diagnosis not present

## 2016-10-08 DIAGNOSIS — D509 Iron deficiency anemia, unspecified: Secondary | ICD-10-CM | POA: Diagnosis not present

## 2016-10-08 DIAGNOSIS — N186 End stage renal disease: Secondary | ICD-10-CM | POA: Diagnosis not present

## 2016-10-08 DIAGNOSIS — D630 Anemia in neoplastic disease: Secondary | ICD-10-CM | POA: Diagnosis not present

## 2016-10-08 DIAGNOSIS — N2581 Secondary hyperparathyroidism of renal origin: Secondary | ICD-10-CM | POA: Diagnosis not present

## 2016-10-08 DIAGNOSIS — N2589 Other disorders resulting from impaired renal tubular function: Secondary | ICD-10-CM | POA: Diagnosis not present

## 2016-10-08 DIAGNOSIS — D631 Anemia in chronic kidney disease: Secondary | ICD-10-CM | POA: Diagnosis not present

## 2016-10-09 DIAGNOSIS — D631 Anemia in chronic kidney disease: Secondary | ICD-10-CM | POA: Diagnosis not present

## 2016-10-09 DIAGNOSIS — D509 Iron deficiency anemia, unspecified: Secondary | ICD-10-CM | POA: Diagnosis not present

## 2016-10-09 DIAGNOSIS — N186 End stage renal disease: Secondary | ICD-10-CM | POA: Diagnosis not present

## 2016-10-09 DIAGNOSIS — N2581 Secondary hyperparathyroidism of renal origin: Secondary | ICD-10-CM | POA: Diagnosis not present

## 2016-10-09 DIAGNOSIS — N2589 Other disorders resulting from impaired renal tubular function: Secondary | ICD-10-CM | POA: Diagnosis not present

## 2016-10-09 DIAGNOSIS — D630 Anemia in neoplastic disease: Secondary | ICD-10-CM | POA: Diagnosis not present

## 2016-10-10 DIAGNOSIS — D509 Iron deficiency anemia, unspecified: Secondary | ICD-10-CM | POA: Diagnosis not present

## 2016-10-10 DIAGNOSIS — N2581 Secondary hyperparathyroidism of renal origin: Secondary | ICD-10-CM | POA: Diagnosis not present

## 2016-10-10 DIAGNOSIS — D630 Anemia in neoplastic disease: Secondary | ICD-10-CM | POA: Diagnosis not present

## 2016-10-10 DIAGNOSIS — D631 Anemia in chronic kidney disease: Secondary | ICD-10-CM | POA: Diagnosis not present

## 2016-10-10 DIAGNOSIS — N186 End stage renal disease: Secondary | ICD-10-CM | POA: Diagnosis not present

## 2016-10-10 DIAGNOSIS — N2589 Other disorders resulting from impaired renal tubular function: Secondary | ICD-10-CM | POA: Diagnosis not present

## 2016-10-11 DIAGNOSIS — N2581 Secondary hyperparathyroidism of renal origin: Secondary | ICD-10-CM | POA: Diagnosis not present

## 2016-10-11 DIAGNOSIS — D631 Anemia in chronic kidney disease: Secondary | ICD-10-CM | POA: Diagnosis not present

## 2016-10-11 DIAGNOSIS — N2589 Other disorders resulting from impaired renal tubular function: Secondary | ICD-10-CM | POA: Diagnosis not present

## 2016-10-11 DIAGNOSIS — N186 End stage renal disease: Secondary | ICD-10-CM | POA: Diagnosis not present

## 2016-10-11 DIAGNOSIS — D630 Anemia in neoplastic disease: Secondary | ICD-10-CM | POA: Diagnosis not present

## 2016-10-11 DIAGNOSIS — D509 Iron deficiency anemia, unspecified: Secondary | ICD-10-CM | POA: Diagnosis not present

## 2016-10-12 DIAGNOSIS — N186 End stage renal disease: Secondary | ICD-10-CM | POA: Diagnosis not present

## 2016-10-12 DIAGNOSIS — N2589 Other disorders resulting from impaired renal tubular function: Secondary | ICD-10-CM | POA: Diagnosis not present

## 2016-10-12 DIAGNOSIS — D630 Anemia in neoplastic disease: Secondary | ICD-10-CM | POA: Diagnosis not present

## 2016-10-12 DIAGNOSIS — N2581 Secondary hyperparathyroidism of renal origin: Secondary | ICD-10-CM | POA: Diagnosis not present

## 2016-10-12 DIAGNOSIS — D631 Anemia in chronic kidney disease: Secondary | ICD-10-CM | POA: Diagnosis not present

## 2016-10-12 DIAGNOSIS — D509 Iron deficiency anemia, unspecified: Secondary | ICD-10-CM | POA: Diagnosis not present

## 2016-10-13 DIAGNOSIS — D630 Anemia in neoplastic disease: Secondary | ICD-10-CM | POA: Diagnosis not present

## 2016-10-13 DIAGNOSIS — N2589 Other disorders resulting from impaired renal tubular function: Secondary | ICD-10-CM | POA: Diagnosis not present

## 2016-10-13 DIAGNOSIS — Z992 Dependence on renal dialysis: Secondary | ICD-10-CM | POA: Diagnosis not present

## 2016-10-13 DIAGNOSIS — D509 Iron deficiency anemia, unspecified: Secondary | ICD-10-CM | POA: Diagnosis not present

## 2016-10-13 DIAGNOSIS — N2581 Secondary hyperparathyroidism of renal origin: Secondary | ICD-10-CM | POA: Diagnosis not present

## 2016-10-13 DIAGNOSIS — N186 End stage renal disease: Secondary | ICD-10-CM | POA: Diagnosis not present

## 2016-10-13 DIAGNOSIS — D631 Anemia in chronic kidney disease: Secondary | ICD-10-CM | POA: Diagnosis not present

## 2016-10-14 DIAGNOSIS — E440 Moderate protein-calorie malnutrition: Secondary | ICD-10-CM | POA: Diagnosis not present

## 2016-10-14 DIAGNOSIS — N186 End stage renal disease: Secondary | ICD-10-CM | POA: Diagnosis not present

## 2016-10-14 DIAGNOSIS — Z5189 Encounter for other specified aftercare: Secondary | ICD-10-CM | POA: Diagnosis not present

## 2016-10-14 DIAGNOSIS — N2581 Secondary hyperparathyroidism of renal origin: Secondary | ICD-10-CM | POA: Diagnosis not present

## 2016-10-14 DIAGNOSIS — Z79899 Other long term (current) drug therapy: Secondary | ICD-10-CM | POA: Diagnosis not present

## 2016-10-14 DIAGNOSIS — D630 Anemia in neoplastic disease: Secondary | ICD-10-CM | POA: Diagnosis not present

## 2016-10-14 DIAGNOSIS — D509 Iron deficiency anemia, unspecified: Secondary | ICD-10-CM | POA: Diagnosis not present

## 2016-10-14 DIAGNOSIS — N2589 Other disorders resulting from impaired renal tubular function: Secondary | ICD-10-CM | POA: Diagnosis not present

## 2016-10-15 DIAGNOSIS — Z5189 Encounter for other specified aftercare: Secondary | ICD-10-CM | POA: Diagnosis not present

## 2016-10-15 DIAGNOSIS — N186 End stage renal disease: Secondary | ICD-10-CM | POA: Diagnosis not present

## 2016-10-15 DIAGNOSIS — D630 Anemia in neoplastic disease: Secondary | ICD-10-CM | POA: Diagnosis not present

## 2016-10-15 DIAGNOSIS — N2589 Other disorders resulting from impaired renal tubular function: Secondary | ICD-10-CM | POA: Diagnosis not present

## 2016-10-15 DIAGNOSIS — D509 Iron deficiency anemia, unspecified: Secondary | ICD-10-CM | POA: Diagnosis not present

## 2016-10-15 DIAGNOSIS — E440 Moderate protein-calorie malnutrition: Secondary | ICD-10-CM | POA: Diagnosis not present

## 2016-10-16 DIAGNOSIS — D509 Iron deficiency anemia, unspecified: Secondary | ICD-10-CM | POA: Diagnosis not present

## 2016-10-16 DIAGNOSIS — N186 End stage renal disease: Secondary | ICD-10-CM | POA: Diagnosis not present

## 2016-10-16 DIAGNOSIS — Z5189 Encounter for other specified aftercare: Secondary | ICD-10-CM | POA: Diagnosis not present

## 2016-10-16 DIAGNOSIS — E440 Moderate protein-calorie malnutrition: Secondary | ICD-10-CM | POA: Diagnosis not present

## 2016-10-16 DIAGNOSIS — N2589 Other disorders resulting from impaired renal tubular function: Secondary | ICD-10-CM | POA: Diagnosis not present

## 2016-10-16 DIAGNOSIS — D630 Anemia in neoplastic disease: Secondary | ICD-10-CM | POA: Diagnosis not present

## 2016-10-17 DIAGNOSIS — N2589 Other disorders resulting from impaired renal tubular function: Secondary | ICD-10-CM | POA: Diagnosis not present

## 2016-10-17 DIAGNOSIS — Z5189 Encounter for other specified aftercare: Secondary | ICD-10-CM | POA: Diagnosis not present

## 2016-10-17 DIAGNOSIS — N186 End stage renal disease: Secondary | ICD-10-CM | POA: Diagnosis not present

## 2016-10-17 DIAGNOSIS — D509 Iron deficiency anemia, unspecified: Secondary | ICD-10-CM | POA: Diagnosis not present

## 2016-10-17 DIAGNOSIS — E440 Moderate protein-calorie malnutrition: Secondary | ICD-10-CM | POA: Diagnosis not present

## 2016-10-17 DIAGNOSIS — D630 Anemia in neoplastic disease: Secondary | ICD-10-CM | POA: Diagnosis not present

## 2016-10-18 DIAGNOSIS — D509 Iron deficiency anemia, unspecified: Secondary | ICD-10-CM | POA: Diagnosis not present

## 2016-10-18 DIAGNOSIS — E440 Moderate protein-calorie malnutrition: Secondary | ICD-10-CM | POA: Diagnosis not present

## 2016-10-18 DIAGNOSIS — N2589 Other disorders resulting from impaired renal tubular function: Secondary | ICD-10-CM | POA: Diagnosis not present

## 2016-10-18 DIAGNOSIS — N186 End stage renal disease: Secondary | ICD-10-CM | POA: Diagnosis not present

## 2016-10-18 DIAGNOSIS — Z5189 Encounter for other specified aftercare: Secondary | ICD-10-CM | POA: Diagnosis not present

## 2016-10-18 DIAGNOSIS — D630 Anemia in neoplastic disease: Secondary | ICD-10-CM | POA: Diagnosis not present

## 2016-10-19 DIAGNOSIS — N2589 Other disorders resulting from impaired renal tubular function: Secondary | ICD-10-CM | POA: Diagnosis not present

## 2016-10-19 DIAGNOSIS — D630 Anemia in neoplastic disease: Secondary | ICD-10-CM | POA: Diagnosis not present

## 2016-10-19 DIAGNOSIS — E440 Moderate protein-calorie malnutrition: Secondary | ICD-10-CM | POA: Diagnosis not present

## 2016-10-19 DIAGNOSIS — D509 Iron deficiency anemia, unspecified: Secondary | ICD-10-CM | POA: Diagnosis not present

## 2016-10-19 DIAGNOSIS — Z5189 Encounter for other specified aftercare: Secondary | ICD-10-CM | POA: Diagnosis not present

## 2016-10-19 DIAGNOSIS — N186 End stage renal disease: Secondary | ICD-10-CM | POA: Diagnosis not present

## 2016-10-20 DIAGNOSIS — E440 Moderate protein-calorie malnutrition: Secondary | ICD-10-CM | POA: Diagnosis not present

## 2016-10-20 DIAGNOSIS — D630 Anemia in neoplastic disease: Secondary | ICD-10-CM | POA: Diagnosis not present

## 2016-10-20 DIAGNOSIS — Z5189 Encounter for other specified aftercare: Secondary | ICD-10-CM | POA: Diagnosis not present

## 2016-10-20 DIAGNOSIS — N2589 Other disorders resulting from impaired renal tubular function: Secondary | ICD-10-CM | POA: Diagnosis not present

## 2016-10-20 DIAGNOSIS — D509 Iron deficiency anemia, unspecified: Secondary | ICD-10-CM | POA: Diagnosis not present

## 2016-10-20 DIAGNOSIS — N186 End stage renal disease: Secondary | ICD-10-CM | POA: Diagnosis not present

## 2016-10-21 DIAGNOSIS — E440 Moderate protein-calorie malnutrition: Secondary | ICD-10-CM | POA: Diagnosis not present

## 2016-10-21 DIAGNOSIS — Z5189 Encounter for other specified aftercare: Secondary | ICD-10-CM | POA: Diagnosis not present

## 2016-10-21 DIAGNOSIS — D509 Iron deficiency anemia, unspecified: Secondary | ICD-10-CM | POA: Diagnosis not present

## 2016-10-21 DIAGNOSIS — N2589 Other disorders resulting from impaired renal tubular function: Secondary | ICD-10-CM | POA: Diagnosis not present

## 2016-10-21 DIAGNOSIS — N186 End stage renal disease: Secondary | ICD-10-CM | POA: Diagnosis not present

## 2016-10-21 DIAGNOSIS — D630 Anemia in neoplastic disease: Secondary | ICD-10-CM | POA: Diagnosis not present

## 2016-10-22 DIAGNOSIS — Z5189 Encounter for other specified aftercare: Secondary | ICD-10-CM | POA: Diagnosis not present

## 2016-10-22 DIAGNOSIS — N2589 Other disorders resulting from impaired renal tubular function: Secondary | ICD-10-CM | POA: Diagnosis not present

## 2016-10-22 DIAGNOSIS — N186 End stage renal disease: Secondary | ICD-10-CM | POA: Diagnosis not present

## 2016-10-22 DIAGNOSIS — D509 Iron deficiency anemia, unspecified: Secondary | ICD-10-CM | POA: Diagnosis not present

## 2016-10-22 DIAGNOSIS — D630 Anemia in neoplastic disease: Secondary | ICD-10-CM | POA: Diagnosis not present

## 2016-10-22 DIAGNOSIS — E440 Moderate protein-calorie malnutrition: Secondary | ICD-10-CM | POA: Diagnosis not present

## 2016-10-23 DIAGNOSIS — E440 Moderate protein-calorie malnutrition: Secondary | ICD-10-CM | POA: Diagnosis not present

## 2016-10-23 DIAGNOSIS — D630 Anemia in neoplastic disease: Secondary | ICD-10-CM | POA: Diagnosis not present

## 2016-10-23 DIAGNOSIS — D509 Iron deficiency anemia, unspecified: Secondary | ICD-10-CM | POA: Diagnosis not present

## 2016-10-23 DIAGNOSIS — Z5189 Encounter for other specified aftercare: Secondary | ICD-10-CM | POA: Diagnosis not present

## 2016-10-23 DIAGNOSIS — N2589 Other disorders resulting from impaired renal tubular function: Secondary | ICD-10-CM | POA: Diagnosis not present

## 2016-10-23 DIAGNOSIS — N186 End stage renal disease: Secondary | ICD-10-CM | POA: Diagnosis not present

## 2016-10-24 DIAGNOSIS — D630 Anemia in neoplastic disease: Secondary | ICD-10-CM | POA: Diagnosis not present

## 2016-10-24 DIAGNOSIS — E440 Moderate protein-calorie malnutrition: Secondary | ICD-10-CM | POA: Diagnosis not present

## 2016-10-24 DIAGNOSIS — N186 End stage renal disease: Secondary | ICD-10-CM | POA: Diagnosis not present

## 2016-10-24 DIAGNOSIS — N2589 Other disorders resulting from impaired renal tubular function: Secondary | ICD-10-CM | POA: Diagnosis not present

## 2016-10-24 DIAGNOSIS — Z5189 Encounter for other specified aftercare: Secondary | ICD-10-CM | POA: Diagnosis not present

## 2016-10-24 DIAGNOSIS — D509 Iron deficiency anemia, unspecified: Secondary | ICD-10-CM | POA: Diagnosis not present

## 2016-10-25 DIAGNOSIS — N2589 Other disorders resulting from impaired renal tubular function: Secondary | ICD-10-CM | POA: Diagnosis not present

## 2016-10-25 DIAGNOSIS — D630 Anemia in neoplastic disease: Secondary | ICD-10-CM | POA: Diagnosis not present

## 2016-10-25 DIAGNOSIS — E440 Moderate protein-calorie malnutrition: Secondary | ICD-10-CM | POA: Diagnosis not present

## 2016-10-25 DIAGNOSIS — N186 End stage renal disease: Secondary | ICD-10-CM | POA: Diagnosis not present

## 2016-10-25 DIAGNOSIS — D509 Iron deficiency anemia, unspecified: Secondary | ICD-10-CM | POA: Diagnosis not present

## 2016-10-25 DIAGNOSIS — Z5189 Encounter for other specified aftercare: Secondary | ICD-10-CM | POA: Diagnosis not present

## 2016-10-26 DIAGNOSIS — N186 End stage renal disease: Secondary | ICD-10-CM | POA: Diagnosis not present

## 2016-10-26 DIAGNOSIS — Z5189 Encounter for other specified aftercare: Secondary | ICD-10-CM | POA: Diagnosis not present

## 2016-10-26 DIAGNOSIS — D630 Anemia in neoplastic disease: Secondary | ICD-10-CM | POA: Diagnosis not present

## 2016-10-26 DIAGNOSIS — E440 Moderate protein-calorie malnutrition: Secondary | ICD-10-CM | POA: Diagnosis not present

## 2016-10-26 DIAGNOSIS — N2589 Other disorders resulting from impaired renal tubular function: Secondary | ICD-10-CM | POA: Diagnosis not present

## 2016-10-26 DIAGNOSIS — D509 Iron deficiency anemia, unspecified: Secondary | ICD-10-CM | POA: Diagnosis not present

## 2016-10-27 DIAGNOSIS — N2589 Other disorders resulting from impaired renal tubular function: Secondary | ICD-10-CM | POA: Diagnosis not present

## 2016-10-27 DIAGNOSIS — Z5189 Encounter for other specified aftercare: Secondary | ICD-10-CM | POA: Diagnosis not present

## 2016-10-27 DIAGNOSIS — D630 Anemia in neoplastic disease: Secondary | ICD-10-CM | POA: Diagnosis not present

## 2016-10-27 DIAGNOSIS — N186 End stage renal disease: Secondary | ICD-10-CM | POA: Diagnosis not present

## 2016-10-27 DIAGNOSIS — E440 Moderate protein-calorie malnutrition: Secondary | ICD-10-CM | POA: Diagnosis not present

## 2016-10-27 DIAGNOSIS — D509 Iron deficiency anemia, unspecified: Secondary | ICD-10-CM | POA: Diagnosis not present

## 2016-10-28 DIAGNOSIS — N2589 Other disorders resulting from impaired renal tubular function: Secondary | ICD-10-CM | POA: Diagnosis not present

## 2016-10-28 DIAGNOSIS — D509 Iron deficiency anemia, unspecified: Secondary | ICD-10-CM | POA: Diagnosis not present

## 2016-10-28 DIAGNOSIS — D630 Anemia in neoplastic disease: Secondary | ICD-10-CM | POA: Diagnosis not present

## 2016-10-28 DIAGNOSIS — Z5189 Encounter for other specified aftercare: Secondary | ICD-10-CM | POA: Diagnosis not present

## 2016-10-28 DIAGNOSIS — E440 Moderate protein-calorie malnutrition: Secondary | ICD-10-CM | POA: Diagnosis not present

## 2016-10-28 DIAGNOSIS — N186 End stage renal disease: Secondary | ICD-10-CM | POA: Diagnosis not present

## 2016-10-29 DIAGNOSIS — D509 Iron deficiency anemia, unspecified: Secondary | ICD-10-CM | POA: Diagnosis not present

## 2016-10-29 DIAGNOSIS — Z5189 Encounter for other specified aftercare: Secondary | ICD-10-CM | POA: Diagnosis not present

## 2016-10-29 DIAGNOSIS — N2589 Other disorders resulting from impaired renal tubular function: Secondary | ICD-10-CM | POA: Diagnosis not present

## 2016-10-29 DIAGNOSIS — N186 End stage renal disease: Secondary | ICD-10-CM | POA: Diagnosis not present

## 2016-10-29 DIAGNOSIS — E440 Moderate protein-calorie malnutrition: Secondary | ICD-10-CM | POA: Diagnosis not present

## 2016-10-29 DIAGNOSIS — D630 Anemia in neoplastic disease: Secondary | ICD-10-CM | POA: Diagnosis not present

## 2016-10-30 DIAGNOSIS — Z5189 Encounter for other specified aftercare: Secondary | ICD-10-CM | POA: Diagnosis not present

## 2016-10-30 DIAGNOSIS — E440 Moderate protein-calorie malnutrition: Secondary | ICD-10-CM | POA: Diagnosis not present

## 2016-10-30 DIAGNOSIS — N186 End stage renal disease: Secondary | ICD-10-CM | POA: Diagnosis not present

## 2016-10-30 DIAGNOSIS — N2589 Other disorders resulting from impaired renal tubular function: Secondary | ICD-10-CM | POA: Diagnosis not present

## 2016-10-30 DIAGNOSIS — D630 Anemia in neoplastic disease: Secondary | ICD-10-CM | POA: Diagnosis not present

## 2016-10-30 DIAGNOSIS — D509 Iron deficiency anemia, unspecified: Secondary | ICD-10-CM | POA: Diagnosis not present

## 2016-10-31 DIAGNOSIS — E440 Moderate protein-calorie malnutrition: Secondary | ICD-10-CM | POA: Diagnosis not present

## 2016-10-31 DIAGNOSIS — Z5189 Encounter for other specified aftercare: Secondary | ICD-10-CM | POA: Diagnosis not present

## 2016-10-31 DIAGNOSIS — D630 Anemia in neoplastic disease: Secondary | ICD-10-CM | POA: Diagnosis not present

## 2016-10-31 DIAGNOSIS — D509 Iron deficiency anemia, unspecified: Secondary | ICD-10-CM | POA: Diagnosis not present

## 2016-10-31 DIAGNOSIS — N2589 Other disorders resulting from impaired renal tubular function: Secondary | ICD-10-CM | POA: Diagnosis not present

## 2016-10-31 DIAGNOSIS — N186 End stage renal disease: Secondary | ICD-10-CM | POA: Diagnosis not present

## 2016-11-01 DIAGNOSIS — D630 Anemia in neoplastic disease: Secondary | ICD-10-CM | POA: Diagnosis not present

## 2016-11-01 DIAGNOSIS — N186 End stage renal disease: Secondary | ICD-10-CM | POA: Diagnosis not present

## 2016-11-01 DIAGNOSIS — E440 Moderate protein-calorie malnutrition: Secondary | ICD-10-CM | POA: Diagnosis not present

## 2016-11-01 DIAGNOSIS — D509 Iron deficiency anemia, unspecified: Secondary | ICD-10-CM | POA: Diagnosis not present

## 2016-11-01 DIAGNOSIS — Z5189 Encounter for other specified aftercare: Secondary | ICD-10-CM | POA: Diagnosis not present

## 2016-11-01 DIAGNOSIS — N2589 Other disorders resulting from impaired renal tubular function: Secondary | ICD-10-CM | POA: Diagnosis not present

## 2016-11-02 DIAGNOSIS — D509 Iron deficiency anemia, unspecified: Secondary | ICD-10-CM | POA: Diagnosis not present

## 2016-11-02 DIAGNOSIS — N186 End stage renal disease: Secondary | ICD-10-CM | POA: Diagnosis not present

## 2016-11-02 DIAGNOSIS — N2589 Other disorders resulting from impaired renal tubular function: Secondary | ICD-10-CM | POA: Diagnosis not present

## 2016-11-02 DIAGNOSIS — E440 Moderate protein-calorie malnutrition: Secondary | ICD-10-CM | POA: Diagnosis not present

## 2016-11-02 DIAGNOSIS — Z5189 Encounter for other specified aftercare: Secondary | ICD-10-CM | POA: Diagnosis not present

## 2016-11-02 DIAGNOSIS — D630 Anemia in neoplastic disease: Secondary | ICD-10-CM | POA: Diagnosis not present

## 2016-11-03 DIAGNOSIS — N186 End stage renal disease: Secondary | ICD-10-CM | POA: Diagnosis not present

## 2016-11-03 DIAGNOSIS — D509 Iron deficiency anemia, unspecified: Secondary | ICD-10-CM | POA: Diagnosis not present

## 2016-11-03 DIAGNOSIS — D630 Anemia in neoplastic disease: Secondary | ICD-10-CM | POA: Diagnosis not present

## 2016-11-03 DIAGNOSIS — Z5189 Encounter for other specified aftercare: Secondary | ICD-10-CM | POA: Diagnosis not present

## 2016-11-03 DIAGNOSIS — N2589 Other disorders resulting from impaired renal tubular function: Secondary | ICD-10-CM | POA: Diagnosis not present

## 2016-11-03 DIAGNOSIS — E440 Moderate protein-calorie malnutrition: Secondary | ICD-10-CM | POA: Diagnosis not present

## 2016-11-04 DIAGNOSIS — D509 Iron deficiency anemia, unspecified: Secondary | ICD-10-CM | POA: Diagnosis not present

## 2016-11-04 DIAGNOSIS — E440 Moderate protein-calorie malnutrition: Secondary | ICD-10-CM | POA: Diagnosis not present

## 2016-11-04 DIAGNOSIS — N2589 Other disorders resulting from impaired renal tubular function: Secondary | ICD-10-CM | POA: Diagnosis not present

## 2016-11-04 DIAGNOSIS — D630 Anemia in neoplastic disease: Secondary | ICD-10-CM | POA: Diagnosis not present

## 2016-11-04 DIAGNOSIS — Z5189 Encounter for other specified aftercare: Secondary | ICD-10-CM | POA: Diagnosis not present

## 2016-11-04 DIAGNOSIS — N186 End stage renal disease: Secondary | ICD-10-CM | POA: Diagnosis not present

## 2016-11-05 DIAGNOSIS — N2589 Other disorders resulting from impaired renal tubular function: Secondary | ICD-10-CM | POA: Diagnosis not present

## 2016-11-05 DIAGNOSIS — D630 Anemia in neoplastic disease: Secondary | ICD-10-CM | POA: Diagnosis not present

## 2016-11-05 DIAGNOSIS — E440 Moderate protein-calorie malnutrition: Secondary | ICD-10-CM | POA: Diagnosis not present

## 2016-11-05 DIAGNOSIS — D509 Iron deficiency anemia, unspecified: Secondary | ICD-10-CM | POA: Diagnosis not present

## 2016-11-05 DIAGNOSIS — N186 End stage renal disease: Secondary | ICD-10-CM | POA: Diagnosis not present

## 2016-11-05 DIAGNOSIS — Z5189 Encounter for other specified aftercare: Secondary | ICD-10-CM | POA: Diagnosis not present

## 2016-11-06 DIAGNOSIS — N186 End stage renal disease: Secondary | ICD-10-CM | POA: Diagnosis not present

## 2016-11-06 DIAGNOSIS — Z5189 Encounter for other specified aftercare: Secondary | ICD-10-CM | POA: Diagnosis not present

## 2016-11-06 DIAGNOSIS — E440 Moderate protein-calorie malnutrition: Secondary | ICD-10-CM | POA: Diagnosis not present

## 2016-11-06 DIAGNOSIS — N2589 Other disorders resulting from impaired renal tubular function: Secondary | ICD-10-CM | POA: Diagnosis not present

## 2016-11-06 DIAGNOSIS — D509 Iron deficiency anemia, unspecified: Secondary | ICD-10-CM | POA: Diagnosis not present

## 2016-11-06 DIAGNOSIS — D630 Anemia in neoplastic disease: Secondary | ICD-10-CM | POA: Diagnosis not present

## 2016-11-07 DIAGNOSIS — E440 Moderate protein-calorie malnutrition: Secondary | ICD-10-CM | POA: Diagnosis not present

## 2016-11-07 DIAGNOSIS — N2589 Other disorders resulting from impaired renal tubular function: Secondary | ICD-10-CM | POA: Diagnosis not present

## 2016-11-07 DIAGNOSIS — D630 Anemia in neoplastic disease: Secondary | ICD-10-CM | POA: Diagnosis not present

## 2016-11-07 DIAGNOSIS — Z5189 Encounter for other specified aftercare: Secondary | ICD-10-CM | POA: Diagnosis not present

## 2016-11-07 DIAGNOSIS — D509 Iron deficiency anemia, unspecified: Secondary | ICD-10-CM | POA: Diagnosis not present

## 2016-11-07 DIAGNOSIS — N186 End stage renal disease: Secondary | ICD-10-CM | POA: Diagnosis not present

## 2016-11-08 DIAGNOSIS — D509 Iron deficiency anemia, unspecified: Secondary | ICD-10-CM | POA: Diagnosis not present

## 2016-11-08 DIAGNOSIS — N2589 Other disorders resulting from impaired renal tubular function: Secondary | ICD-10-CM | POA: Diagnosis not present

## 2016-11-08 DIAGNOSIS — Z5189 Encounter for other specified aftercare: Secondary | ICD-10-CM | POA: Diagnosis not present

## 2016-11-08 DIAGNOSIS — D630 Anemia in neoplastic disease: Secondary | ICD-10-CM | POA: Diagnosis not present

## 2016-11-08 DIAGNOSIS — N186 End stage renal disease: Secondary | ICD-10-CM | POA: Diagnosis not present

## 2016-11-08 DIAGNOSIS — E440 Moderate protein-calorie malnutrition: Secondary | ICD-10-CM | POA: Diagnosis not present

## 2016-11-09 DIAGNOSIS — N2589 Other disorders resulting from impaired renal tubular function: Secondary | ICD-10-CM | POA: Diagnosis not present

## 2016-11-09 DIAGNOSIS — D630 Anemia in neoplastic disease: Secondary | ICD-10-CM | POA: Diagnosis not present

## 2016-11-09 DIAGNOSIS — E440 Moderate protein-calorie malnutrition: Secondary | ICD-10-CM | POA: Diagnosis not present

## 2016-11-09 DIAGNOSIS — Z5189 Encounter for other specified aftercare: Secondary | ICD-10-CM | POA: Diagnosis not present

## 2016-11-09 DIAGNOSIS — D509 Iron deficiency anemia, unspecified: Secondary | ICD-10-CM | POA: Diagnosis not present

## 2016-11-09 DIAGNOSIS — N186 End stage renal disease: Secondary | ICD-10-CM | POA: Diagnosis not present

## 2016-11-10 DIAGNOSIS — N2589 Other disorders resulting from impaired renal tubular function: Secondary | ICD-10-CM | POA: Diagnosis not present

## 2016-11-10 DIAGNOSIS — N186 End stage renal disease: Secondary | ICD-10-CM | POA: Diagnosis not present

## 2016-11-10 DIAGNOSIS — D509 Iron deficiency anemia, unspecified: Secondary | ICD-10-CM | POA: Diagnosis not present

## 2016-11-10 DIAGNOSIS — E440 Moderate protein-calorie malnutrition: Secondary | ICD-10-CM | POA: Diagnosis not present

## 2016-11-10 DIAGNOSIS — D630 Anemia in neoplastic disease: Secondary | ICD-10-CM | POA: Diagnosis not present

## 2016-11-10 DIAGNOSIS — Z5189 Encounter for other specified aftercare: Secondary | ICD-10-CM | POA: Diagnosis not present

## 2016-11-11 DIAGNOSIS — N186 End stage renal disease: Secondary | ICD-10-CM | POA: Diagnosis not present

## 2016-11-11 DIAGNOSIS — N2589 Other disorders resulting from impaired renal tubular function: Secondary | ICD-10-CM | POA: Diagnosis not present

## 2016-11-11 DIAGNOSIS — D509 Iron deficiency anemia, unspecified: Secondary | ICD-10-CM | POA: Diagnosis not present

## 2016-11-11 DIAGNOSIS — E440 Moderate protein-calorie malnutrition: Secondary | ICD-10-CM | POA: Diagnosis not present

## 2016-11-11 DIAGNOSIS — Z5189 Encounter for other specified aftercare: Secondary | ICD-10-CM | POA: Diagnosis not present

## 2016-11-11 DIAGNOSIS — D630 Anemia in neoplastic disease: Secondary | ICD-10-CM | POA: Diagnosis not present

## 2016-11-12 DIAGNOSIS — Z5189 Encounter for other specified aftercare: Secondary | ICD-10-CM | POA: Diagnosis not present

## 2016-11-12 DIAGNOSIS — N186 End stage renal disease: Secondary | ICD-10-CM | POA: Diagnosis not present

## 2016-11-12 DIAGNOSIS — N2589 Other disorders resulting from impaired renal tubular function: Secondary | ICD-10-CM | POA: Diagnosis not present

## 2016-11-12 DIAGNOSIS — D509 Iron deficiency anemia, unspecified: Secondary | ICD-10-CM | POA: Diagnosis not present

## 2016-11-12 DIAGNOSIS — D630 Anemia in neoplastic disease: Secondary | ICD-10-CM | POA: Diagnosis not present

## 2016-11-12 DIAGNOSIS — E440 Moderate protein-calorie malnutrition: Secondary | ICD-10-CM | POA: Diagnosis not present

## 2016-11-13 DIAGNOSIS — D509 Iron deficiency anemia, unspecified: Secondary | ICD-10-CM | POA: Diagnosis not present

## 2016-11-13 DIAGNOSIS — D630 Anemia in neoplastic disease: Secondary | ICD-10-CM | POA: Diagnosis not present

## 2016-11-13 DIAGNOSIS — Z5189 Encounter for other specified aftercare: Secondary | ICD-10-CM | POA: Diagnosis not present

## 2016-11-13 DIAGNOSIS — N186 End stage renal disease: Secondary | ICD-10-CM | POA: Diagnosis not present

## 2016-11-13 DIAGNOSIS — N2589 Other disorders resulting from impaired renal tubular function: Secondary | ICD-10-CM | POA: Diagnosis not present

## 2016-11-13 DIAGNOSIS — E440 Moderate protein-calorie malnutrition: Secondary | ICD-10-CM | POA: Diagnosis not present

## 2016-11-13 DIAGNOSIS — Z992 Dependence on renal dialysis: Secondary | ICD-10-CM | POA: Diagnosis not present

## 2016-11-14 DIAGNOSIS — N2589 Other disorders resulting from impaired renal tubular function: Secondary | ICD-10-CM | POA: Diagnosis not present

## 2016-11-14 DIAGNOSIS — E440 Moderate protein-calorie malnutrition: Secondary | ICD-10-CM | POA: Diagnosis not present

## 2016-11-14 DIAGNOSIS — D509 Iron deficiency anemia, unspecified: Secondary | ICD-10-CM | POA: Diagnosis not present

## 2016-11-14 DIAGNOSIS — Z5189 Encounter for other specified aftercare: Secondary | ICD-10-CM | POA: Diagnosis not present

## 2016-11-14 DIAGNOSIS — D630 Anemia in neoplastic disease: Secondary | ICD-10-CM | POA: Diagnosis not present

## 2016-11-14 DIAGNOSIS — N2581 Secondary hyperparathyroidism of renal origin: Secondary | ICD-10-CM | POA: Diagnosis not present

## 2016-11-14 DIAGNOSIS — Z79899 Other long term (current) drug therapy: Secondary | ICD-10-CM | POA: Diagnosis not present

## 2016-11-14 DIAGNOSIS — N186 End stage renal disease: Secondary | ICD-10-CM | POA: Diagnosis not present

## 2016-11-14 DIAGNOSIS — D631 Anemia in chronic kidney disease: Secondary | ICD-10-CM | POA: Diagnosis not present

## 2016-11-15 DIAGNOSIS — N186 End stage renal disease: Secondary | ICD-10-CM | POA: Diagnosis not present

## 2016-11-15 DIAGNOSIS — Z5189 Encounter for other specified aftercare: Secondary | ICD-10-CM | POA: Diagnosis not present

## 2016-11-15 DIAGNOSIS — N2581 Secondary hyperparathyroidism of renal origin: Secondary | ICD-10-CM | POA: Diagnosis not present

## 2016-11-15 DIAGNOSIS — N2589 Other disorders resulting from impaired renal tubular function: Secondary | ICD-10-CM | POA: Diagnosis not present

## 2016-11-15 DIAGNOSIS — D630 Anemia in neoplastic disease: Secondary | ICD-10-CM | POA: Diagnosis not present

## 2016-11-15 DIAGNOSIS — Z79899 Other long term (current) drug therapy: Secondary | ICD-10-CM | POA: Diagnosis not present

## 2016-11-16 DIAGNOSIS — N2589 Other disorders resulting from impaired renal tubular function: Secondary | ICD-10-CM | POA: Diagnosis not present

## 2016-11-16 DIAGNOSIS — D630 Anemia in neoplastic disease: Secondary | ICD-10-CM | POA: Diagnosis not present

## 2016-11-16 DIAGNOSIS — N2581 Secondary hyperparathyroidism of renal origin: Secondary | ICD-10-CM | POA: Diagnosis not present

## 2016-11-16 DIAGNOSIS — Z79899 Other long term (current) drug therapy: Secondary | ICD-10-CM | POA: Diagnosis not present

## 2016-11-16 DIAGNOSIS — Z5189 Encounter for other specified aftercare: Secondary | ICD-10-CM | POA: Diagnosis not present

## 2016-11-16 DIAGNOSIS — N186 End stage renal disease: Secondary | ICD-10-CM | POA: Diagnosis not present

## 2016-11-17 DIAGNOSIS — Z79899 Other long term (current) drug therapy: Secondary | ICD-10-CM | POA: Diagnosis not present

## 2016-11-17 DIAGNOSIS — D630 Anemia in neoplastic disease: Secondary | ICD-10-CM | POA: Diagnosis not present

## 2016-11-17 DIAGNOSIS — Z5189 Encounter for other specified aftercare: Secondary | ICD-10-CM | POA: Diagnosis not present

## 2016-11-17 DIAGNOSIS — N186 End stage renal disease: Secondary | ICD-10-CM | POA: Diagnosis not present

## 2016-11-17 DIAGNOSIS — N2581 Secondary hyperparathyroidism of renal origin: Secondary | ICD-10-CM | POA: Diagnosis not present

## 2016-11-17 DIAGNOSIS — N2589 Other disorders resulting from impaired renal tubular function: Secondary | ICD-10-CM | POA: Diagnosis not present

## 2016-11-18 DIAGNOSIS — Z79899 Other long term (current) drug therapy: Secondary | ICD-10-CM | POA: Diagnosis not present

## 2016-11-18 DIAGNOSIS — Z5189 Encounter for other specified aftercare: Secondary | ICD-10-CM | POA: Diagnosis not present

## 2016-11-18 DIAGNOSIS — N2589 Other disorders resulting from impaired renal tubular function: Secondary | ICD-10-CM | POA: Diagnosis not present

## 2016-11-18 DIAGNOSIS — N186 End stage renal disease: Secondary | ICD-10-CM | POA: Diagnosis not present

## 2016-11-18 DIAGNOSIS — N2581 Secondary hyperparathyroidism of renal origin: Secondary | ICD-10-CM | POA: Diagnosis not present

## 2016-11-18 DIAGNOSIS — D630 Anemia in neoplastic disease: Secondary | ICD-10-CM | POA: Diagnosis not present

## 2016-11-19 DIAGNOSIS — N2581 Secondary hyperparathyroidism of renal origin: Secondary | ICD-10-CM | POA: Diagnosis not present

## 2016-11-19 DIAGNOSIS — D630 Anemia in neoplastic disease: Secondary | ICD-10-CM | POA: Diagnosis not present

## 2016-11-19 DIAGNOSIS — N186 End stage renal disease: Secondary | ICD-10-CM | POA: Diagnosis not present

## 2016-11-19 DIAGNOSIS — Z5189 Encounter for other specified aftercare: Secondary | ICD-10-CM | POA: Diagnosis not present

## 2016-11-19 DIAGNOSIS — Z79899 Other long term (current) drug therapy: Secondary | ICD-10-CM | POA: Diagnosis not present

## 2016-11-19 DIAGNOSIS — N2589 Other disorders resulting from impaired renal tubular function: Secondary | ICD-10-CM | POA: Diagnosis not present

## 2016-11-20 DIAGNOSIS — Z79899 Other long term (current) drug therapy: Secondary | ICD-10-CM | POA: Diagnosis not present

## 2016-11-20 DIAGNOSIS — N186 End stage renal disease: Secondary | ICD-10-CM | POA: Diagnosis not present

## 2016-11-20 DIAGNOSIS — Z5189 Encounter for other specified aftercare: Secondary | ICD-10-CM | POA: Diagnosis not present

## 2016-11-20 DIAGNOSIS — N2581 Secondary hyperparathyroidism of renal origin: Secondary | ICD-10-CM | POA: Diagnosis not present

## 2016-11-20 DIAGNOSIS — N2589 Other disorders resulting from impaired renal tubular function: Secondary | ICD-10-CM | POA: Diagnosis not present

## 2016-11-20 DIAGNOSIS — D630 Anemia in neoplastic disease: Secondary | ICD-10-CM | POA: Diagnosis not present

## 2016-11-21 DIAGNOSIS — N2589 Other disorders resulting from impaired renal tubular function: Secondary | ICD-10-CM | POA: Diagnosis not present

## 2016-11-21 DIAGNOSIS — Z79899 Other long term (current) drug therapy: Secondary | ICD-10-CM | POA: Diagnosis not present

## 2016-11-21 DIAGNOSIS — N186 End stage renal disease: Secondary | ICD-10-CM | POA: Diagnosis not present

## 2016-11-21 DIAGNOSIS — Z5189 Encounter for other specified aftercare: Secondary | ICD-10-CM | POA: Diagnosis not present

## 2016-11-21 DIAGNOSIS — D630 Anemia in neoplastic disease: Secondary | ICD-10-CM | POA: Diagnosis not present

## 2016-11-21 DIAGNOSIS — N2581 Secondary hyperparathyroidism of renal origin: Secondary | ICD-10-CM | POA: Diagnosis not present

## 2016-11-22 DIAGNOSIS — N2581 Secondary hyperparathyroidism of renal origin: Secondary | ICD-10-CM | POA: Diagnosis not present

## 2016-11-22 DIAGNOSIS — N2589 Other disorders resulting from impaired renal tubular function: Secondary | ICD-10-CM | POA: Diagnosis not present

## 2016-11-22 DIAGNOSIS — Z5189 Encounter for other specified aftercare: Secondary | ICD-10-CM | POA: Diagnosis not present

## 2016-11-22 DIAGNOSIS — N186 End stage renal disease: Secondary | ICD-10-CM | POA: Diagnosis not present

## 2016-11-22 DIAGNOSIS — Z79899 Other long term (current) drug therapy: Secondary | ICD-10-CM | POA: Diagnosis not present

## 2016-11-22 DIAGNOSIS — D630 Anemia in neoplastic disease: Secondary | ICD-10-CM | POA: Diagnosis not present

## 2016-11-23 DIAGNOSIS — Z5189 Encounter for other specified aftercare: Secondary | ICD-10-CM | POA: Diagnosis not present

## 2016-11-23 DIAGNOSIS — N186 End stage renal disease: Secondary | ICD-10-CM | POA: Diagnosis not present

## 2016-11-23 DIAGNOSIS — N2589 Other disorders resulting from impaired renal tubular function: Secondary | ICD-10-CM | POA: Diagnosis not present

## 2016-11-23 DIAGNOSIS — Z79899 Other long term (current) drug therapy: Secondary | ICD-10-CM | POA: Diagnosis not present

## 2016-11-23 DIAGNOSIS — D630 Anemia in neoplastic disease: Secondary | ICD-10-CM | POA: Diagnosis not present

## 2016-11-23 DIAGNOSIS — N2581 Secondary hyperparathyroidism of renal origin: Secondary | ICD-10-CM | POA: Diagnosis not present

## 2016-11-24 DIAGNOSIS — N2581 Secondary hyperparathyroidism of renal origin: Secondary | ICD-10-CM | POA: Diagnosis not present

## 2016-11-24 DIAGNOSIS — Z5189 Encounter for other specified aftercare: Secondary | ICD-10-CM | POA: Diagnosis not present

## 2016-11-24 DIAGNOSIS — D630 Anemia in neoplastic disease: Secondary | ICD-10-CM | POA: Diagnosis not present

## 2016-11-24 DIAGNOSIS — N2589 Other disorders resulting from impaired renal tubular function: Secondary | ICD-10-CM | POA: Diagnosis not present

## 2016-11-24 DIAGNOSIS — N186 End stage renal disease: Secondary | ICD-10-CM | POA: Diagnosis not present

## 2016-11-24 DIAGNOSIS — Z79899 Other long term (current) drug therapy: Secondary | ICD-10-CM | POA: Diagnosis not present

## 2016-11-25 DIAGNOSIS — N2589 Other disorders resulting from impaired renal tubular function: Secondary | ICD-10-CM | POA: Diagnosis not present

## 2016-11-25 DIAGNOSIS — N2581 Secondary hyperparathyroidism of renal origin: Secondary | ICD-10-CM | POA: Diagnosis not present

## 2016-11-25 DIAGNOSIS — N186 End stage renal disease: Secondary | ICD-10-CM | POA: Diagnosis not present

## 2016-11-25 DIAGNOSIS — Z79899 Other long term (current) drug therapy: Secondary | ICD-10-CM | POA: Diagnosis not present

## 2016-11-25 DIAGNOSIS — D630 Anemia in neoplastic disease: Secondary | ICD-10-CM | POA: Diagnosis not present

## 2016-11-25 DIAGNOSIS — Z5189 Encounter for other specified aftercare: Secondary | ICD-10-CM | POA: Diagnosis not present

## 2016-11-26 DIAGNOSIS — R82998 Other abnormal findings in urine: Secondary | ICD-10-CM | POA: Diagnosis not present

## 2016-11-26 DIAGNOSIS — Z5189 Encounter for other specified aftercare: Secondary | ICD-10-CM | POA: Diagnosis not present

## 2016-11-26 DIAGNOSIS — D630 Anemia in neoplastic disease: Secondary | ICD-10-CM | POA: Diagnosis not present

## 2016-11-26 DIAGNOSIS — Z79899 Other long term (current) drug therapy: Secondary | ICD-10-CM | POA: Diagnosis not present

## 2016-11-26 DIAGNOSIS — N2581 Secondary hyperparathyroidism of renal origin: Secondary | ICD-10-CM | POA: Diagnosis not present

## 2016-11-26 DIAGNOSIS — N186 End stage renal disease: Secondary | ICD-10-CM | POA: Diagnosis not present

## 2016-11-26 DIAGNOSIS — N2589 Other disorders resulting from impaired renal tubular function: Secondary | ICD-10-CM | POA: Diagnosis not present

## 2016-11-27 DIAGNOSIS — D630 Anemia in neoplastic disease: Secondary | ICD-10-CM | POA: Diagnosis not present

## 2016-11-27 DIAGNOSIS — N186 End stage renal disease: Secondary | ICD-10-CM | POA: Diagnosis not present

## 2016-11-27 DIAGNOSIS — N2589 Other disorders resulting from impaired renal tubular function: Secondary | ICD-10-CM | POA: Diagnosis not present

## 2016-11-27 DIAGNOSIS — Z79899 Other long term (current) drug therapy: Secondary | ICD-10-CM | POA: Diagnosis not present

## 2016-11-27 DIAGNOSIS — N2581 Secondary hyperparathyroidism of renal origin: Secondary | ICD-10-CM | POA: Diagnosis not present

## 2016-11-27 DIAGNOSIS — Z5189 Encounter for other specified aftercare: Secondary | ICD-10-CM | POA: Diagnosis not present

## 2016-11-28 DIAGNOSIS — N2581 Secondary hyperparathyroidism of renal origin: Secondary | ICD-10-CM | POA: Diagnosis not present

## 2016-11-28 DIAGNOSIS — Z5189 Encounter for other specified aftercare: Secondary | ICD-10-CM | POA: Diagnosis not present

## 2016-11-28 DIAGNOSIS — D630 Anemia in neoplastic disease: Secondary | ICD-10-CM | POA: Diagnosis not present

## 2016-11-28 DIAGNOSIS — Z79899 Other long term (current) drug therapy: Secondary | ICD-10-CM | POA: Diagnosis not present

## 2016-11-28 DIAGNOSIS — N2589 Other disorders resulting from impaired renal tubular function: Secondary | ICD-10-CM | POA: Diagnosis not present

## 2016-11-28 DIAGNOSIS — N186 End stage renal disease: Secondary | ICD-10-CM | POA: Diagnosis not present

## 2016-11-29 DIAGNOSIS — D630 Anemia in neoplastic disease: Secondary | ICD-10-CM | POA: Diagnosis not present

## 2016-11-29 DIAGNOSIS — N2581 Secondary hyperparathyroidism of renal origin: Secondary | ICD-10-CM | POA: Diagnosis not present

## 2016-11-29 DIAGNOSIS — N2589 Other disorders resulting from impaired renal tubular function: Secondary | ICD-10-CM | POA: Diagnosis not present

## 2016-11-29 DIAGNOSIS — Z5189 Encounter for other specified aftercare: Secondary | ICD-10-CM | POA: Diagnosis not present

## 2016-11-29 DIAGNOSIS — Z79899 Other long term (current) drug therapy: Secondary | ICD-10-CM | POA: Diagnosis not present

## 2016-11-29 DIAGNOSIS — N186 End stage renal disease: Secondary | ICD-10-CM | POA: Diagnosis not present

## 2016-11-30 DIAGNOSIS — Z79899 Other long term (current) drug therapy: Secondary | ICD-10-CM | POA: Diagnosis not present

## 2016-11-30 DIAGNOSIS — N2581 Secondary hyperparathyroidism of renal origin: Secondary | ICD-10-CM | POA: Diagnosis not present

## 2016-11-30 DIAGNOSIS — D630 Anemia in neoplastic disease: Secondary | ICD-10-CM | POA: Diagnosis not present

## 2016-11-30 DIAGNOSIS — N186 End stage renal disease: Secondary | ICD-10-CM | POA: Diagnosis not present

## 2016-11-30 DIAGNOSIS — Z5189 Encounter for other specified aftercare: Secondary | ICD-10-CM | POA: Diagnosis not present

## 2016-11-30 DIAGNOSIS — N2589 Other disorders resulting from impaired renal tubular function: Secondary | ICD-10-CM | POA: Diagnosis not present

## 2016-12-01 DIAGNOSIS — N186 End stage renal disease: Secondary | ICD-10-CM | POA: Diagnosis not present

## 2016-12-01 DIAGNOSIS — D630 Anemia in neoplastic disease: Secondary | ICD-10-CM | POA: Diagnosis not present

## 2016-12-01 DIAGNOSIS — N2581 Secondary hyperparathyroidism of renal origin: Secondary | ICD-10-CM | POA: Diagnosis not present

## 2016-12-01 DIAGNOSIS — N2589 Other disorders resulting from impaired renal tubular function: Secondary | ICD-10-CM | POA: Diagnosis not present

## 2016-12-01 DIAGNOSIS — Z79899 Other long term (current) drug therapy: Secondary | ICD-10-CM | POA: Diagnosis not present

## 2016-12-01 DIAGNOSIS — Z5189 Encounter for other specified aftercare: Secondary | ICD-10-CM | POA: Diagnosis not present

## 2016-12-02 DIAGNOSIS — N2589 Other disorders resulting from impaired renal tubular function: Secondary | ICD-10-CM | POA: Diagnosis not present

## 2016-12-02 DIAGNOSIS — D630 Anemia in neoplastic disease: Secondary | ICD-10-CM | POA: Diagnosis not present

## 2016-12-02 DIAGNOSIS — Z79899 Other long term (current) drug therapy: Secondary | ICD-10-CM | POA: Diagnosis not present

## 2016-12-02 DIAGNOSIS — Z5189 Encounter for other specified aftercare: Secondary | ICD-10-CM | POA: Diagnosis not present

## 2016-12-02 DIAGNOSIS — N2581 Secondary hyperparathyroidism of renal origin: Secondary | ICD-10-CM | POA: Diagnosis not present

## 2016-12-02 DIAGNOSIS — N186 End stage renal disease: Secondary | ICD-10-CM | POA: Diagnosis not present

## 2016-12-03 DIAGNOSIS — Z79899 Other long term (current) drug therapy: Secondary | ICD-10-CM | POA: Diagnosis not present

## 2016-12-03 DIAGNOSIS — N2581 Secondary hyperparathyroidism of renal origin: Secondary | ICD-10-CM | POA: Diagnosis not present

## 2016-12-03 DIAGNOSIS — N186 End stage renal disease: Secondary | ICD-10-CM | POA: Diagnosis not present

## 2016-12-03 DIAGNOSIS — D630 Anemia in neoplastic disease: Secondary | ICD-10-CM | POA: Diagnosis not present

## 2016-12-03 DIAGNOSIS — Z5189 Encounter for other specified aftercare: Secondary | ICD-10-CM | POA: Diagnosis not present

## 2016-12-03 DIAGNOSIS — N2589 Other disorders resulting from impaired renal tubular function: Secondary | ICD-10-CM | POA: Diagnosis not present

## 2016-12-04 DIAGNOSIS — N186 End stage renal disease: Secondary | ICD-10-CM | POA: Diagnosis not present

## 2016-12-04 DIAGNOSIS — Z5189 Encounter for other specified aftercare: Secondary | ICD-10-CM | POA: Diagnosis not present

## 2016-12-04 DIAGNOSIS — Z79899 Other long term (current) drug therapy: Secondary | ICD-10-CM | POA: Diagnosis not present

## 2016-12-04 DIAGNOSIS — D630 Anemia in neoplastic disease: Secondary | ICD-10-CM | POA: Diagnosis not present

## 2016-12-04 DIAGNOSIS — N2581 Secondary hyperparathyroidism of renal origin: Secondary | ICD-10-CM | POA: Diagnosis not present

## 2016-12-04 DIAGNOSIS — N2589 Other disorders resulting from impaired renal tubular function: Secondary | ICD-10-CM | POA: Diagnosis not present

## 2016-12-05 DIAGNOSIS — N186 End stage renal disease: Secondary | ICD-10-CM | POA: Diagnosis not present

## 2016-12-05 DIAGNOSIS — N2581 Secondary hyperparathyroidism of renal origin: Secondary | ICD-10-CM | POA: Diagnosis not present

## 2016-12-05 DIAGNOSIS — Z5189 Encounter for other specified aftercare: Secondary | ICD-10-CM | POA: Diagnosis not present

## 2016-12-05 DIAGNOSIS — N2589 Other disorders resulting from impaired renal tubular function: Secondary | ICD-10-CM | POA: Diagnosis not present

## 2016-12-05 DIAGNOSIS — Z79899 Other long term (current) drug therapy: Secondary | ICD-10-CM | POA: Diagnosis not present

## 2016-12-05 DIAGNOSIS — D630 Anemia in neoplastic disease: Secondary | ICD-10-CM | POA: Diagnosis not present

## 2016-12-06 DIAGNOSIS — N186 End stage renal disease: Secondary | ICD-10-CM | POA: Diagnosis not present

## 2016-12-06 DIAGNOSIS — N2589 Other disorders resulting from impaired renal tubular function: Secondary | ICD-10-CM | POA: Diagnosis not present

## 2016-12-06 DIAGNOSIS — N2581 Secondary hyperparathyroidism of renal origin: Secondary | ICD-10-CM | POA: Diagnosis not present

## 2016-12-06 DIAGNOSIS — Z79899 Other long term (current) drug therapy: Secondary | ICD-10-CM | POA: Diagnosis not present

## 2016-12-06 DIAGNOSIS — Z5189 Encounter for other specified aftercare: Secondary | ICD-10-CM | POA: Diagnosis not present

## 2016-12-06 DIAGNOSIS — D630 Anemia in neoplastic disease: Secondary | ICD-10-CM | POA: Diagnosis not present

## 2016-12-07 DIAGNOSIS — N2589 Other disorders resulting from impaired renal tubular function: Secondary | ICD-10-CM | POA: Diagnosis not present

## 2016-12-07 DIAGNOSIS — D630 Anemia in neoplastic disease: Secondary | ICD-10-CM | POA: Diagnosis not present

## 2016-12-07 DIAGNOSIS — Z79899 Other long term (current) drug therapy: Secondary | ICD-10-CM | POA: Diagnosis not present

## 2016-12-07 DIAGNOSIS — N2581 Secondary hyperparathyroidism of renal origin: Secondary | ICD-10-CM | POA: Diagnosis not present

## 2016-12-07 DIAGNOSIS — Z5189 Encounter for other specified aftercare: Secondary | ICD-10-CM | POA: Diagnosis not present

## 2016-12-07 DIAGNOSIS — N186 End stage renal disease: Secondary | ICD-10-CM | POA: Diagnosis not present

## 2016-12-08 DIAGNOSIS — N186 End stage renal disease: Secondary | ICD-10-CM | POA: Diagnosis not present

## 2016-12-08 DIAGNOSIS — Z79899 Other long term (current) drug therapy: Secondary | ICD-10-CM | POA: Diagnosis not present

## 2016-12-08 DIAGNOSIS — Z5189 Encounter for other specified aftercare: Secondary | ICD-10-CM | POA: Diagnosis not present

## 2016-12-08 DIAGNOSIS — N2589 Other disorders resulting from impaired renal tubular function: Secondary | ICD-10-CM | POA: Diagnosis not present

## 2016-12-08 DIAGNOSIS — D630 Anemia in neoplastic disease: Secondary | ICD-10-CM | POA: Diagnosis not present

## 2016-12-08 DIAGNOSIS — N2581 Secondary hyperparathyroidism of renal origin: Secondary | ICD-10-CM | POA: Diagnosis not present

## 2016-12-09 DIAGNOSIS — N2589 Other disorders resulting from impaired renal tubular function: Secondary | ICD-10-CM | POA: Diagnosis not present

## 2016-12-09 DIAGNOSIS — D630 Anemia in neoplastic disease: Secondary | ICD-10-CM | POA: Diagnosis not present

## 2016-12-09 DIAGNOSIS — N186 End stage renal disease: Secondary | ICD-10-CM | POA: Diagnosis not present

## 2016-12-09 DIAGNOSIS — Z5189 Encounter for other specified aftercare: Secondary | ICD-10-CM | POA: Diagnosis not present

## 2016-12-09 DIAGNOSIS — N2581 Secondary hyperparathyroidism of renal origin: Secondary | ICD-10-CM | POA: Diagnosis not present

## 2016-12-09 DIAGNOSIS — Z79899 Other long term (current) drug therapy: Secondary | ICD-10-CM | POA: Diagnosis not present

## 2016-12-10 DIAGNOSIS — D630 Anemia in neoplastic disease: Secondary | ICD-10-CM | POA: Diagnosis not present

## 2016-12-10 DIAGNOSIS — N186 End stage renal disease: Secondary | ICD-10-CM | POA: Diagnosis not present

## 2016-12-10 DIAGNOSIS — N2581 Secondary hyperparathyroidism of renal origin: Secondary | ICD-10-CM | POA: Diagnosis not present

## 2016-12-10 DIAGNOSIS — N2589 Other disorders resulting from impaired renal tubular function: Secondary | ICD-10-CM | POA: Diagnosis not present

## 2016-12-10 DIAGNOSIS — Z79899 Other long term (current) drug therapy: Secondary | ICD-10-CM | POA: Diagnosis not present

## 2016-12-10 DIAGNOSIS — Z5189 Encounter for other specified aftercare: Secondary | ICD-10-CM | POA: Diagnosis not present

## 2016-12-11 DIAGNOSIS — N2589 Other disorders resulting from impaired renal tubular function: Secondary | ICD-10-CM | POA: Diagnosis not present

## 2016-12-11 DIAGNOSIS — Z79899 Other long term (current) drug therapy: Secondary | ICD-10-CM | POA: Diagnosis not present

## 2016-12-11 DIAGNOSIS — Z5189 Encounter for other specified aftercare: Secondary | ICD-10-CM | POA: Diagnosis not present

## 2016-12-11 DIAGNOSIS — N2581 Secondary hyperparathyroidism of renal origin: Secondary | ICD-10-CM | POA: Diagnosis not present

## 2016-12-11 DIAGNOSIS — N186 End stage renal disease: Secondary | ICD-10-CM | POA: Diagnosis not present

## 2016-12-11 DIAGNOSIS — D630 Anemia in neoplastic disease: Secondary | ICD-10-CM | POA: Diagnosis not present

## 2016-12-12 DIAGNOSIS — D630 Anemia in neoplastic disease: Secondary | ICD-10-CM | POA: Diagnosis not present

## 2016-12-12 DIAGNOSIS — Z79899 Other long term (current) drug therapy: Secondary | ICD-10-CM | POA: Diagnosis not present

## 2016-12-12 DIAGNOSIS — N2589 Other disorders resulting from impaired renal tubular function: Secondary | ICD-10-CM | POA: Diagnosis not present

## 2016-12-12 DIAGNOSIS — Z5189 Encounter for other specified aftercare: Secondary | ICD-10-CM | POA: Diagnosis not present

## 2016-12-12 DIAGNOSIS — N186 End stage renal disease: Secondary | ICD-10-CM | POA: Diagnosis not present

## 2016-12-12 DIAGNOSIS — N2581 Secondary hyperparathyroidism of renal origin: Secondary | ICD-10-CM | POA: Diagnosis not present

## 2016-12-13 DIAGNOSIS — D630 Anemia in neoplastic disease: Secondary | ICD-10-CM | POA: Diagnosis not present

## 2016-12-13 DIAGNOSIS — Z5189 Encounter for other specified aftercare: Secondary | ICD-10-CM | POA: Diagnosis not present

## 2016-12-13 DIAGNOSIS — Z992 Dependence on renal dialysis: Secondary | ICD-10-CM | POA: Diagnosis not present

## 2016-12-13 DIAGNOSIS — N2589 Other disorders resulting from impaired renal tubular function: Secondary | ICD-10-CM | POA: Diagnosis not present

## 2016-12-13 DIAGNOSIS — Z79899 Other long term (current) drug therapy: Secondary | ICD-10-CM | POA: Diagnosis not present

## 2016-12-13 DIAGNOSIS — N186 End stage renal disease: Secondary | ICD-10-CM | POA: Diagnosis not present

## 2016-12-13 DIAGNOSIS — N2581 Secondary hyperparathyroidism of renal origin: Secondary | ICD-10-CM | POA: Diagnosis not present

## 2016-12-14 DIAGNOSIS — N2589 Other disorders resulting from impaired renal tubular function: Secondary | ICD-10-CM | POA: Diagnosis not present

## 2016-12-14 DIAGNOSIS — D631 Anemia in chronic kidney disease: Secondary | ICD-10-CM | POA: Diagnosis not present

## 2016-12-14 DIAGNOSIS — D509 Iron deficiency anemia, unspecified: Secondary | ICD-10-CM | POA: Diagnosis not present

## 2016-12-14 DIAGNOSIS — N2581 Secondary hyperparathyroidism of renal origin: Secondary | ICD-10-CM | POA: Diagnosis not present

## 2016-12-14 DIAGNOSIS — D630 Anemia in neoplastic disease: Secondary | ICD-10-CM | POA: Diagnosis not present

## 2016-12-14 DIAGNOSIS — N186 End stage renal disease: Secondary | ICD-10-CM | POA: Diagnosis not present

## 2016-12-15 DIAGNOSIS — D509 Iron deficiency anemia, unspecified: Secondary | ICD-10-CM | POA: Diagnosis not present

## 2016-12-15 DIAGNOSIS — N186 End stage renal disease: Secondary | ICD-10-CM | POA: Diagnosis not present

## 2016-12-15 DIAGNOSIS — D631 Anemia in chronic kidney disease: Secondary | ICD-10-CM | POA: Diagnosis not present

## 2016-12-15 DIAGNOSIS — N2589 Other disorders resulting from impaired renal tubular function: Secondary | ICD-10-CM | POA: Diagnosis not present

## 2016-12-15 DIAGNOSIS — N2581 Secondary hyperparathyroidism of renal origin: Secondary | ICD-10-CM | POA: Diagnosis not present

## 2016-12-15 DIAGNOSIS — D630 Anemia in neoplastic disease: Secondary | ICD-10-CM | POA: Diagnosis not present

## 2016-12-16 DIAGNOSIS — N186 End stage renal disease: Secondary | ICD-10-CM | POA: Diagnosis not present

## 2016-12-16 DIAGNOSIS — D631 Anemia in chronic kidney disease: Secondary | ICD-10-CM | POA: Diagnosis not present

## 2016-12-16 DIAGNOSIS — N2589 Other disorders resulting from impaired renal tubular function: Secondary | ICD-10-CM | POA: Diagnosis not present

## 2016-12-16 DIAGNOSIS — N2581 Secondary hyperparathyroidism of renal origin: Secondary | ICD-10-CM | POA: Diagnosis not present

## 2016-12-16 DIAGNOSIS — D630 Anemia in neoplastic disease: Secondary | ICD-10-CM | POA: Diagnosis not present

## 2016-12-16 DIAGNOSIS — D509 Iron deficiency anemia, unspecified: Secondary | ICD-10-CM | POA: Diagnosis not present

## 2016-12-17 DIAGNOSIS — D631 Anemia in chronic kidney disease: Secondary | ICD-10-CM | POA: Diagnosis not present

## 2016-12-17 DIAGNOSIS — R82998 Other abnormal findings in urine: Secondary | ICD-10-CM | POA: Diagnosis not present

## 2016-12-17 DIAGNOSIS — N2589 Other disorders resulting from impaired renal tubular function: Secondary | ICD-10-CM | POA: Diagnosis not present

## 2016-12-17 DIAGNOSIS — N2581 Secondary hyperparathyroidism of renal origin: Secondary | ICD-10-CM | POA: Diagnosis not present

## 2016-12-17 DIAGNOSIS — D509 Iron deficiency anemia, unspecified: Secondary | ICD-10-CM | POA: Diagnosis not present

## 2016-12-17 DIAGNOSIS — D630 Anemia in neoplastic disease: Secondary | ICD-10-CM | POA: Diagnosis not present

## 2016-12-17 DIAGNOSIS — E7849 Other hyperlipidemia: Secondary | ICD-10-CM | POA: Diagnosis not present

## 2016-12-17 DIAGNOSIS — N186 End stage renal disease: Secondary | ICD-10-CM | POA: Diagnosis not present

## 2016-12-18 DIAGNOSIS — N2589 Other disorders resulting from impaired renal tubular function: Secondary | ICD-10-CM | POA: Diagnosis not present

## 2016-12-18 DIAGNOSIS — D631 Anemia in chronic kidney disease: Secondary | ICD-10-CM | POA: Diagnosis not present

## 2016-12-18 DIAGNOSIS — N186 End stage renal disease: Secondary | ICD-10-CM | POA: Diagnosis not present

## 2016-12-18 DIAGNOSIS — N2581 Secondary hyperparathyroidism of renal origin: Secondary | ICD-10-CM | POA: Diagnosis not present

## 2016-12-18 DIAGNOSIS — D630 Anemia in neoplastic disease: Secondary | ICD-10-CM | POA: Diagnosis not present

## 2016-12-18 DIAGNOSIS — D509 Iron deficiency anemia, unspecified: Secondary | ICD-10-CM | POA: Diagnosis not present

## 2016-12-19 DIAGNOSIS — N2589 Other disorders resulting from impaired renal tubular function: Secondary | ICD-10-CM | POA: Diagnosis not present

## 2016-12-19 DIAGNOSIS — N2581 Secondary hyperparathyroidism of renal origin: Secondary | ICD-10-CM | POA: Diagnosis not present

## 2016-12-19 DIAGNOSIS — D631 Anemia in chronic kidney disease: Secondary | ICD-10-CM | POA: Diagnosis not present

## 2016-12-19 DIAGNOSIS — N186 End stage renal disease: Secondary | ICD-10-CM | POA: Diagnosis not present

## 2016-12-19 DIAGNOSIS — D509 Iron deficiency anemia, unspecified: Secondary | ICD-10-CM | POA: Diagnosis not present

## 2016-12-19 DIAGNOSIS — D630 Anemia in neoplastic disease: Secondary | ICD-10-CM | POA: Diagnosis not present

## 2016-12-20 DIAGNOSIS — D630 Anemia in neoplastic disease: Secondary | ICD-10-CM | POA: Diagnosis not present

## 2016-12-20 DIAGNOSIS — N2589 Other disorders resulting from impaired renal tubular function: Secondary | ICD-10-CM | POA: Diagnosis not present

## 2016-12-20 DIAGNOSIS — N186 End stage renal disease: Secondary | ICD-10-CM | POA: Diagnosis not present

## 2016-12-20 DIAGNOSIS — N2581 Secondary hyperparathyroidism of renal origin: Secondary | ICD-10-CM | POA: Diagnosis not present

## 2016-12-20 DIAGNOSIS — D631 Anemia in chronic kidney disease: Secondary | ICD-10-CM | POA: Diagnosis not present

## 2016-12-20 DIAGNOSIS — D509 Iron deficiency anemia, unspecified: Secondary | ICD-10-CM | POA: Diagnosis not present

## 2016-12-21 DIAGNOSIS — N2589 Other disorders resulting from impaired renal tubular function: Secondary | ICD-10-CM | POA: Diagnosis not present

## 2016-12-21 DIAGNOSIS — D630 Anemia in neoplastic disease: Secondary | ICD-10-CM | POA: Diagnosis not present

## 2016-12-21 DIAGNOSIS — D631 Anemia in chronic kidney disease: Secondary | ICD-10-CM | POA: Diagnosis not present

## 2016-12-21 DIAGNOSIS — N2581 Secondary hyperparathyroidism of renal origin: Secondary | ICD-10-CM | POA: Diagnosis not present

## 2016-12-21 DIAGNOSIS — D509 Iron deficiency anemia, unspecified: Secondary | ICD-10-CM | POA: Diagnosis not present

## 2016-12-21 DIAGNOSIS — N186 End stage renal disease: Secondary | ICD-10-CM | POA: Diagnosis not present

## 2016-12-22 DIAGNOSIS — N2589 Other disorders resulting from impaired renal tubular function: Secondary | ICD-10-CM | POA: Diagnosis not present

## 2016-12-22 DIAGNOSIS — D631 Anemia in chronic kidney disease: Secondary | ICD-10-CM | POA: Diagnosis not present

## 2016-12-22 DIAGNOSIS — N186 End stage renal disease: Secondary | ICD-10-CM | POA: Diagnosis not present

## 2016-12-22 DIAGNOSIS — D509 Iron deficiency anemia, unspecified: Secondary | ICD-10-CM | POA: Diagnosis not present

## 2016-12-22 DIAGNOSIS — N2581 Secondary hyperparathyroidism of renal origin: Secondary | ICD-10-CM | POA: Diagnosis not present

## 2016-12-22 DIAGNOSIS — D630 Anemia in neoplastic disease: Secondary | ICD-10-CM | POA: Diagnosis not present

## 2016-12-23 DIAGNOSIS — D630 Anemia in neoplastic disease: Secondary | ICD-10-CM | POA: Diagnosis not present

## 2016-12-23 DIAGNOSIS — D509 Iron deficiency anemia, unspecified: Secondary | ICD-10-CM | POA: Diagnosis not present

## 2016-12-23 DIAGNOSIS — N2589 Other disorders resulting from impaired renal tubular function: Secondary | ICD-10-CM | POA: Diagnosis not present

## 2016-12-23 DIAGNOSIS — D631 Anemia in chronic kidney disease: Secondary | ICD-10-CM | POA: Diagnosis not present

## 2016-12-23 DIAGNOSIS — N186 End stage renal disease: Secondary | ICD-10-CM | POA: Diagnosis not present

## 2016-12-23 DIAGNOSIS — N2581 Secondary hyperparathyroidism of renal origin: Secondary | ICD-10-CM | POA: Diagnosis not present

## 2016-12-24 DIAGNOSIS — N186 End stage renal disease: Secondary | ICD-10-CM | POA: Diagnosis not present

## 2016-12-24 DIAGNOSIS — N2589 Other disorders resulting from impaired renal tubular function: Secondary | ICD-10-CM | POA: Diagnosis not present

## 2016-12-24 DIAGNOSIS — D509 Iron deficiency anemia, unspecified: Secondary | ICD-10-CM | POA: Diagnosis not present

## 2016-12-24 DIAGNOSIS — D630 Anemia in neoplastic disease: Secondary | ICD-10-CM | POA: Diagnosis not present

## 2016-12-24 DIAGNOSIS — N2581 Secondary hyperparathyroidism of renal origin: Secondary | ICD-10-CM | POA: Diagnosis not present

## 2016-12-24 DIAGNOSIS — D631 Anemia in chronic kidney disease: Secondary | ICD-10-CM | POA: Diagnosis not present

## 2016-12-25 DIAGNOSIS — D631 Anemia in chronic kidney disease: Secondary | ICD-10-CM | POA: Diagnosis not present

## 2016-12-25 DIAGNOSIS — N186 End stage renal disease: Secondary | ICD-10-CM | POA: Diagnosis not present

## 2016-12-25 DIAGNOSIS — N2589 Other disorders resulting from impaired renal tubular function: Secondary | ICD-10-CM | POA: Diagnosis not present

## 2016-12-25 DIAGNOSIS — N2581 Secondary hyperparathyroidism of renal origin: Secondary | ICD-10-CM | POA: Diagnosis not present

## 2016-12-25 DIAGNOSIS — D630 Anemia in neoplastic disease: Secondary | ICD-10-CM | POA: Diagnosis not present

## 2016-12-25 DIAGNOSIS — D509 Iron deficiency anemia, unspecified: Secondary | ICD-10-CM | POA: Diagnosis not present

## 2016-12-26 DIAGNOSIS — N2581 Secondary hyperparathyroidism of renal origin: Secondary | ICD-10-CM | POA: Diagnosis not present

## 2016-12-26 DIAGNOSIS — D631 Anemia in chronic kidney disease: Secondary | ICD-10-CM | POA: Diagnosis not present

## 2016-12-26 DIAGNOSIS — N2589 Other disorders resulting from impaired renal tubular function: Secondary | ICD-10-CM | POA: Diagnosis not present

## 2016-12-26 DIAGNOSIS — D630 Anemia in neoplastic disease: Secondary | ICD-10-CM | POA: Diagnosis not present

## 2016-12-26 DIAGNOSIS — D509 Iron deficiency anemia, unspecified: Secondary | ICD-10-CM | POA: Diagnosis not present

## 2016-12-26 DIAGNOSIS — N186 End stage renal disease: Secondary | ICD-10-CM | POA: Diagnosis not present

## 2016-12-27 DIAGNOSIS — N2581 Secondary hyperparathyroidism of renal origin: Secondary | ICD-10-CM | POA: Diagnosis not present

## 2016-12-27 DIAGNOSIS — N2589 Other disorders resulting from impaired renal tubular function: Secondary | ICD-10-CM | POA: Diagnosis not present

## 2016-12-27 DIAGNOSIS — D509 Iron deficiency anemia, unspecified: Secondary | ICD-10-CM | POA: Diagnosis not present

## 2016-12-27 DIAGNOSIS — D630 Anemia in neoplastic disease: Secondary | ICD-10-CM | POA: Diagnosis not present

## 2016-12-27 DIAGNOSIS — D631 Anemia in chronic kidney disease: Secondary | ICD-10-CM | POA: Diagnosis not present

## 2016-12-27 DIAGNOSIS — N186 End stage renal disease: Secondary | ICD-10-CM | POA: Diagnosis not present

## 2016-12-28 DIAGNOSIS — N186 End stage renal disease: Secondary | ICD-10-CM | POA: Diagnosis not present

## 2016-12-28 DIAGNOSIS — D630 Anemia in neoplastic disease: Secondary | ICD-10-CM | POA: Diagnosis not present

## 2016-12-28 DIAGNOSIS — D631 Anemia in chronic kidney disease: Secondary | ICD-10-CM | POA: Diagnosis not present

## 2016-12-28 DIAGNOSIS — N2581 Secondary hyperparathyroidism of renal origin: Secondary | ICD-10-CM | POA: Diagnosis not present

## 2016-12-28 DIAGNOSIS — N2589 Other disorders resulting from impaired renal tubular function: Secondary | ICD-10-CM | POA: Diagnosis not present

## 2016-12-28 DIAGNOSIS — D509 Iron deficiency anemia, unspecified: Secondary | ICD-10-CM | POA: Diagnosis not present

## 2016-12-29 DIAGNOSIS — N2581 Secondary hyperparathyroidism of renal origin: Secondary | ICD-10-CM | POA: Diagnosis not present

## 2016-12-29 DIAGNOSIS — D509 Iron deficiency anemia, unspecified: Secondary | ICD-10-CM | POA: Diagnosis not present

## 2016-12-29 DIAGNOSIS — N186 End stage renal disease: Secondary | ICD-10-CM | POA: Diagnosis not present

## 2016-12-29 DIAGNOSIS — N2589 Other disorders resulting from impaired renal tubular function: Secondary | ICD-10-CM | POA: Diagnosis not present

## 2016-12-29 DIAGNOSIS — D631 Anemia in chronic kidney disease: Secondary | ICD-10-CM | POA: Diagnosis not present

## 2016-12-29 DIAGNOSIS — D630 Anemia in neoplastic disease: Secondary | ICD-10-CM | POA: Diagnosis not present

## 2016-12-30 DIAGNOSIS — N2581 Secondary hyperparathyroidism of renal origin: Secondary | ICD-10-CM | POA: Diagnosis not present

## 2016-12-30 DIAGNOSIS — D509 Iron deficiency anemia, unspecified: Secondary | ICD-10-CM | POA: Diagnosis not present

## 2016-12-30 DIAGNOSIS — D631 Anemia in chronic kidney disease: Secondary | ICD-10-CM | POA: Diagnosis not present

## 2016-12-30 DIAGNOSIS — D630 Anemia in neoplastic disease: Secondary | ICD-10-CM | POA: Diagnosis not present

## 2016-12-30 DIAGNOSIS — N2589 Other disorders resulting from impaired renal tubular function: Secondary | ICD-10-CM | POA: Diagnosis not present

## 2016-12-30 DIAGNOSIS — N186 End stage renal disease: Secondary | ICD-10-CM | POA: Diagnosis not present

## 2016-12-31 DIAGNOSIS — D631 Anemia in chronic kidney disease: Secondary | ICD-10-CM | POA: Diagnosis not present

## 2016-12-31 DIAGNOSIS — N2589 Other disorders resulting from impaired renal tubular function: Secondary | ICD-10-CM | POA: Diagnosis not present

## 2016-12-31 DIAGNOSIS — N186 End stage renal disease: Secondary | ICD-10-CM | POA: Diagnosis not present

## 2016-12-31 DIAGNOSIS — D630 Anemia in neoplastic disease: Secondary | ICD-10-CM | POA: Diagnosis not present

## 2016-12-31 DIAGNOSIS — D509 Iron deficiency anemia, unspecified: Secondary | ICD-10-CM | POA: Diagnosis not present

## 2016-12-31 DIAGNOSIS — N2581 Secondary hyperparathyroidism of renal origin: Secondary | ICD-10-CM | POA: Diagnosis not present

## 2017-01-01 DIAGNOSIS — D630 Anemia in neoplastic disease: Secondary | ICD-10-CM | POA: Diagnosis not present

## 2017-01-01 DIAGNOSIS — N2589 Other disorders resulting from impaired renal tubular function: Secondary | ICD-10-CM | POA: Diagnosis not present

## 2017-01-01 DIAGNOSIS — N2581 Secondary hyperparathyroidism of renal origin: Secondary | ICD-10-CM | POA: Diagnosis not present

## 2017-01-01 DIAGNOSIS — N186 End stage renal disease: Secondary | ICD-10-CM | POA: Diagnosis not present

## 2017-01-01 DIAGNOSIS — D509 Iron deficiency anemia, unspecified: Secondary | ICD-10-CM | POA: Diagnosis not present

## 2017-01-01 DIAGNOSIS — D631 Anemia in chronic kidney disease: Secondary | ICD-10-CM | POA: Diagnosis not present

## 2017-01-02 DIAGNOSIS — D631 Anemia in chronic kidney disease: Secondary | ICD-10-CM | POA: Diagnosis not present

## 2017-01-02 DIAGNOSIS — N2581 Secondary hyperparathyroidism of renal origin: Secondary | ICD-10-CM | POA: Diagnosis not present

## 2017-01-02 DIAGNOSIS — N186 End stage renal disease: Secondary | ICD-10-CM | POA: Diagnosis not present

## 2017-01-02 DIAGNOSIS — D509 Iron deficiency anemia, unspecified: Secondary | ICD-10-CM | POA: Diagnosis not present

## 2017-01-02 DIAGNOSIS — N2589 Other disorders resulting from impaired renal tubular function: Secondary | ICD-10-CM | POA: Diagnosis not present

## 2017-01-02 DIAGNOSIS — D630 Anemia in neoplastic disease: Secondary | ICD-10-CM | POA: Diagnosis not present

## 2017-01-03 DIAGNOSIS — D509 Iron deficiency anemia, unspecified: Secondary | ICD-10-CM | POA: Diagnosis not present

## 2017-01-03 DIAGNOSIS — N2589 Other disorders resulting from impaired renal tubular function: Secondary | ICD-10-CM | POA: Diagnosis not present

## 2017-01-03 DIAGNOSIS — D630 Anemia in neoplastic disease: Secondary | ICD-10-CM | POA: Diagnosis not present

## 2017-01-03 DIAGNOSIS — N186 End stage renal disease: Secondary | ICD-10-CM | POA: Diagnosis not present

## 2017-01-03 DIAGNOSIS — N2581 Secondary hyperparathyroidism of renal origin: Secondary | ICD-10-CM | POA: Diagnosis not present

## 2017-01-03 DIAGNOSIS — D631 Anemia in chronic kidney disease: Secondary | ICD-10-CM | POA: Diagnosis not present

## 2017-01-04 DIAGNOSIS — D630 Anemia in neoplastic disease: Secondary | ICD-10-CM | POA: Diagnosis not present

## 2017-01-04 DIAGNOSIS — N2581 Secondary hyperparathyroidism of renal origin: Secondary | ICD-10-CM | POA: Diagnosis not present

## 2017-01-04 DIAGNOSIS — N186 End stage renal disease: Secondary | ICD-10-CM | POA: Diagnosis not present

## 2017-01-04 DIAGNOSIS — N2589 Other disorders resulting from impaired renal tubular function: Secondary | ICD-10-CM | POA: Diagnosis not present

## 2017-01-04 DIAGNOSIS — D509 Iron deficiency anemia, unspecified: Secondary | ICD-10-CM | POA: Diagnosis not present

## 2017-01-04 DIAGNOSIS — D631 Anemia in chronic kidney disease: Secondary | ICD-10-CM | POA: Diagnosis not present

## 2017-01-05 DIAGNOSIS — N2589 Other disorders resulting from impaired renal tubular function: Secondary | ICD-10-CM | POA: Diagnosis not present

## 2017-01-05 DIAGNOSIS — D509 Iron deficiency anemia, unspecified: Secondary | ICD-10-CM | POA: Diagnosis not present

## 2017-01-05 DIAGNOSIS — D630 Anemia in neoplastic disease: Secondary | ICD-10-CM | POA: Diagnosis not present

## 2017-01-05 DIAGNOSIS — N186 End stage renal disease: Secondary | ICD-10-CM | POA: Diagnosis not present

## 2017-01-05 DIAGNOSIS — D631 Anemia in chronic kidney disease: Secondary | ICD-10-CM | POA: Diagnosis not present

## 2017-01-05 DIAGNOSIS — N2581 Secondary hyperparathyroidism of renal origin: Secondary | ICD-10-CM | POA: Diagnosis not present

## 2017-01-06 DIAGNOSIS — N2589 Other disorders resulting from impaired renal tubular function: Secondary | ICD-10-CM | POA: Diagnosis not present

## 2017-01-06 DIAGNOSIS — N186 End stage renal disease: Secondary | ICD-10-CM | POA: Diagnosis not present

## 2017-01-06 DIAGNOSIS — D630 Anemia in neoplastic disease: Secondary | ICD-10-CM | POA: Diagnosis not present

## 2017-01-06 DIAGNOSIS — N2581 Secondary hyperparathyroidism of renal origin: Secondary | ICD-10-CM | POA: Diagnosis not present

## 2017-01-06 DIAGNOSIS — D631 Anemia in chronic kidney disease: Secondary | ICD-10-CM | POA: Diagnosis not present

## 2017-01-06 DIAGNOSIS — D509 Iron deficiency anemia, unspecified: Secondary | ICD-10-CM | POA: Diagnosis not present

## 2017-01-07 DIAGNOSIS — D631 Anemia in chronic kidney disease: Secondary | ICD-10-CM | POA: Diagnosis not present

## 2017-01-07 DIAGNOSIS — N186 End stage renal disease: Secondary | ICD-10-CM | POA: Diagnosis not present

## 2017-01-07 DIAGNOSIS — D630 Anemia in neoplastic disease: Secondary | ICD-10-CM | POA: Diagnosis not present

## 2017-01-07 DIAGNOSIS — D509 Iron deficiency anemia, unspecified: Secondary | ICD-10-CM | POA: Diagnosis not present

## 2017-01-07 DIAGNOSIS — N2589 Other disorders resulting from impaired renal tubular function: Secondary | ICD-10-CM | POA: Diagnosis not present

## 2017-01-07 DIAGNOSIS — N2581 Secondary hyperparathyroidism of renal origin: Secondary | ICD-10-CM | POA: Diagnosis not present

## 2017-01-08 DIAGNOSIS — N2581 Secondary hyperparathyroidism of renal origin: Secondary | ICD-10-CM | POA: Diagnosis not present

## 2017-01-08 DIAGNOSIS — D631 Anemia in chronic kidney disease: Secondary | ICD-10-CM | POA: Diagnosis not present

## 2017-01-08 DIAGNOSIS — D509 Iron deficiency anemia, unspecified: Secondary | ICD-10-CM | POA: Diagnosis not present

## 2017-01-08 DIAGNOSIS — D630 Anemia in neoplastic disease: Secondary | ICD-10-CM | POA: Diagnosis not present

## 2017-01-08 DIAGNOSIS — N2589 Other disorders resulting from impaired renal tubular function: Secondary | ICD-10-CM | POA: Diagnosis not present

## 2017-01-08 DIAGNOSIS — N186 End stage renal disease: Secondary | ICD-10-CM | POA: Diagnosis not present

## 2017-01-09 DIAGNOSIS — N2589 Other disorders resulting from impaired renal tubular function: Secondary | ICD-10-CM | POA: Diagnosis not present

## 2017-01-09 DIAGNOSIS — D630 Anemia in neoplastic disease: Secondary | ICD-10-CM | POA: Diagnosis not present

## 2017-01-09 DIAGNOSIS — N186 End stage renal disease: Secondary | ICD-10-CM | POA: Diagnosis not present

## 2017-01-09 DIAGNOSIS — D631 Anemia in chronic kidney disease: Secondary | ICD-10-CM | POA: Diagnosis not present

## 2017-01-09 DIAGNOSIS — N2581 Secondary hyperparathyroidism of renal origin: Secondary | ICD-10-CM | POA: Diagnosis not present

## 2017-01-09 DIAGNOSIS — D509 Iron deficiency anemia, unspecified: Secondary | ICD-10-CM | POA: Diagnosis not present

## 2017-01-10 DIAGNOSIS — D630 Anemia in neoplastic disease: Secondary | ICD-10-CM | POA: Diagnosis not present

## 2017-01-10 DIAGNOSIS — N2581 Secondary hyperparathyroidism of renal origin: Secondary | ICD-10-CM | POA: Diagnosis not present

## 2017-01-10 DIAGNOSIS — D509 Iron deficiency anemia, unspecified: Secondary | ICD-10-CM | POA: Diagnosis not present

## 2017-01-10 DIAGNOSIS — N2589 Other disorders resulting from impaired renal tubular function: Secondary | ICD-10-CM | POA: Diagnosis not present

## 2017-01-10 DIAGNOSIS — D631 Anemia in chronic kidney disease: Secondary | ICD-10-CM | POA: Diagnosis not present

## 2017-01-10 DIAGNOSIS — N186 End stage renal disease: Secondary | ICD-10-CM | POA: Diagnosis not present

## 2017-01-11 DIAGNOSIS — D630 Anemia in neoplastic disease: Secondary | ICD-10-CM | POA: Diagnosis not present

## 2017-01-11 DIAGNOSIS — D509 Iron deficiency anemia, unspecified: Secondary | ICD-10-CM | POA: Diagnosis not present

## 2017-01-11 DIAGNOSIS — N2581 Secondary hyperparathyroidism of renal origin: Secondary | ICD-10-CM | POA: Diagnosis not present

## 2017-01-11 DIAGNOSIS — N186 End stage renal disease: Secondary | ICD-10-CM | POA: Diagnosis not present

## 2017-01-11 DIAGNOSIS — D631 Anemia in chronic kidney disease: Secondary | ICD-10-CM | POA: Diagnosis not present

## 2017-01-11 DIAGNOSIS — N2589 Other disorders resulting from impaired renal tubular function: Secondary | ICD-10-CM | POA: Diagnosis not present

## 2017-01-12 DIAGNOSIS — N2589 Other disorders resulting from impaired renal tubular function: Secondary | ICD-10-CM | POA: Diagnosis not present

## 2017-01-12 DIAGNOSIS — D631 Anemia in chronic kidney disease: Secondary | ICD-10-CM | POA: Diagnosis not present

## 2017-01-12 DIAGNOSIS — D509 Iron deficiency anemia, unspecified: Secondary | ICD-10-CM | POA: Diagnosis not present

## 2017-01-12 DIAGNOSIS — D630 Anemia in neoplastic disease: Secondary | ICD-10-CM | POA: Diagnosis not present

## 2017-01-12 DIAGNOSIS — N2581 Secondary hyperparathyroidism of renal origin: Secondary | ICD-10-CM | POA: Diagnosis not present

## 2017-01-12 DIAGNOSIS — N186 End stage renal disease: Secondary | ICD-10-CM | POA: Diagnosis not present

## 2017-01-13 DIAGNOSIS — N2589 Other disorders resulting from impaired renal tubular function: Secondary | ICD-10-CM | POA: Diagnosis not present

## 2017-01-13 DIAGNOSIS — N186 End stage renal disease: Secondary | ICD-10-CM | POA: Diagnosis not present

## 2017-01-13 DIAGNOSIS — D631 Anemia in chronic kidney disease: Secondary | ICD-10-CM | POA: Diagnosis not present

## 2017-01-13 DIAGNOSIS — N2581 Secondary hyperparathyroidism of renal origin: Secondary | ICD-10-CM | POA: Diagnosis not present

## 2017-01-13 DIAGNOSIS — Z992 Dependence on renal dialysis: Secondary | ICD-10-CM | POA: Diagnosis not present

## 2017-01-13 DIAGNOSIS — D509 Iron deficiency anemia, unspecified: Secondary | ICD-10-CM | POA: Diagnosis not present

## 2017-01-13 DIAGNOSIS — D630 Anemia in neoplastic disease: Secondary | ICD-10-CM | POA: Diagnosis not present

## 2017-01-14 DIAGNOSIS — Z5189 Encounter for other specified aftercare: Secondary | ICD-10-CM | POA: Diagnosis not present

## 2017-01-14 DIAGNOSIS — N186 End stage renal disease: Secondary | ICD-10-CM | POA: Diagnosis not present

## 2017-01-14 DIAGNOSIS — E440 Moderate protein-calorie malnutrition: Secondary | ICD-10-CM | POA: Diagnosis not present

## 2017-01-14 DIAGNOSIS — N2581 Secondary hyperparathyroidism of renal origin: Secondary | ICD-10-CM | POA: Diagnosis not present

## 2017-01-14 DIAGNOSIS — D509 Iron deficiency anemia, unspecified: Secondary | ICD-10-CM | POA: Diagnosis not present

## 2017-01-14 DIAGNOSIS — N2589 Other disorders resulting from impaired renal tubular function: Secondary | ICD-10-CM | POA: Diagnosis not present

## 2017-01-14 DIAGNOSIS — D630 Anemia in neoplastic disease: Secondary | ICD-10-CM | POA: Diagnosis not present

## 2017-01-14 DIAGNOSIS — Z79899 Other long term (current) drug therapy: Secondary | ICD-10-CM | POA: Diagnosis not present

## 2017-01-15 DIAGNOSIS — D630 Anemia in neoplastic disease: Secondary | ICD-10-CM | POA: Diagnosis not present

## 2017-01-15 DIAGNOSIS — D509 Iron deficiency anemia, unspecified: Secondary | ICD-10-CM | POA: Diagnosis not present

## 2017-01-15 DIAGNOSIS — N186 End stage renal disease: Secondary | ICD-10-CM | POA: Diagnosis not present

## 2017-01-15 DIAGNOSIS — Z5189 Encounter for other specified aftercare: Secondary | ICD-10-CM | POA: Diagnosis not present

## 2017-01-15 DIAGNOSIS — E440 Moderate protein-calorie malnutrition: Secondary | ICD-10-CM | POA: Diagnosis not present

## 2017-01-15 DIAGNOSIS — Z79899 Other long term (current) drug therapy: Secondary | ICD-10-CM | POA: Diagnosis not present

## 2017-01-16 DIAGNOSIS — E440 Moderate protein-calorie malnutrition: Secondary | ICD-10-CM | POA: Diagnosis not present

## 2017-01-16 DIAGNOSIS — D509 Iron deficiency anemia, unspecified: Secondary | ICD-10-CM | POA: Diagnosis not present

## 2017-01-16 DIAGNOSIS — Z79899 Other long term (current) drug therapy: Secondary | ICD-10-CM | POA: Diagnosis not present

## 2017-01-16 DIAGNOSIS — D630 Anemia in neoplastic disease: Secondary | ICD-10-CM | POA: Diagnosis not present

## 2017-01-16 DIAGNOSIS — N186 End stage renal disease: Secondary | ICD-10-CM | POA: Diagnosis not present

## 2017-01-16 DIAGNOSIS — Z5189 Encounter for other specified aftercare: Secondary | ICD-10-CM | POA: Diagnosis not present

## 2017-01-17 DIAGNOSIS — E440 Moderate protein-calorie malnutrition: Secondary | ICD-10-CM | POA: Diagnosis not present

## 2017-01-17 DIAGNOSIS — D630 Anemia in neoplastic disease: Secondary | ICD-10-CM | POA: Diagnosis not present

## 2017-01-17 DIAGNOSIS — N186 End stage renal disease: Secondary | ICD-10-CM | POA: Diagnosis not present

## 2017-01-17 DIAGNOSIS — Z5189 Encounter for other specified aftercare: Secondary | ICD-10-CM | POA: Diagnosis not present

## 2017-01-17 DIAGNOSIS — D509 Iron deficiency anemia, unspecified: Secondary | ICD-10-CM | POA: Diagnosis not present

## 2017-01-17 DIAGNOSIS — Z79899 Other long term (current) drug therapy: Secondary | ICD-10-CM | POA: Diagnosis not present

## 2017-01-18 DIAGNOSIS — D630 Anemia in neoplastic disease: Secondary | ICD-10-CM | POA: Diagnosis not present

## 2017-01-18 DIAGNOSIS — D509 Iron deficiency anemia, unspecified: Secondary | ICD-10-CM | POA: Diagnosis not present

## 2017-01-18 DIAGNOSIS — Z5189 Encounter for other specified aftercare: Secondary | ICD-10-CM | POA: Diagnosis not present

## 2017-01-18 DIAGNOSIS — Z79899 Other long term (current) drug therapy: Secondary | ICD-10-CM | POA: Diagnosis not present

## 2017-01-18 DIAGNOSIS — E440 Moderate protein-calorie malnutrition: Secondary | ICD-10-CM | POA: Diagnosis not present

## 2017-01-18 DIAGNOSIS — N186 End stage renal disease: Secondary | ICD-10-CM | POA: Diagnosis not present

## 2017-01-19 DIAGNOSIS — E440 Moderate protein-calorie malnutrition: Secondary | ICD-10-CM | POA: Diagnosis not present

## 2017-01-19 DIAGNOSIS — Z79899 Other long term (current) drug therapy: Secondary | ICD-10-CM | POA: Diagnosis not present

## 2017-01-19 DIAGNOSIS — N186 End stage renal disease: Secondary | ICD-10-CM | POA: Diagnosis not present

## 2017-01-19 DIAGNOSIS — Z5189 Encounter for other specified aftercare: Secondary | ICD-10-CM | POA: Diagnosis not present

## 2017-01-19 DIAGNOSIS — D509 Iron deficiency anemia, unspecified: Secondary | ICD-10-CM | POA: Diagnosis not present

## 2017-01-19 DIAGNOSIS — D630 Anemia in neoplastic disease: Secondary | ICD-10-CM | POA: Diagnosis not present

## 2017-01-20 DIAGNOSIS — Z79899 Other long term (current) drug therapy: Secondary | ICD-10-CM | POA: Diagnosis not present

## 2017-01-20 DIAGNOSIS — E440 Moderate protein-calorie malnutrition: Secondary | ICD-10-CM | POA: Diagnosis not present

## 2017-01-20 DIAGNOSIS — R82998 Other abnormal findings in urine: Secondary | ICD-10-CM | POA: Diagnosis not present

## 2017-01-20 DIAGNOSIS — D509 Iron deficiency anemia, unspecified: Secondary | ICD-10-CM | POA: Diagnosis not present

## 2017-01-20 DIAGNOSIS — Z5189 Encounter for other specified aftercare: Secondary | ICD-10-CM | POA: Diagnosis not present

## 2017-01-20 DIAGNOSIS — N186 End stage renal disease: Secondary | ICD-10-CM | POA: Diagnosis not present

## 2017-01-20 DIAGNOSIS — D630 Anemia in neoplastic disease: Secondary | ICD-10-CM | POA: Diagnosis not present

## 2017-01-21 DIAGNOSIS — D630 Anemia in neoplastic disease: Secondary | ICD-10-CM | POA: Diagnosis not present

## 2017-01-21 DIAGNOSIS — Z79899 Other long term (current) drug therapy: Secondary | ICD-10-CM | POA: Diagnosis not present

## 2017-01-21 DIAGNOSIS — D509 Iron deficiency anemia, unspecified: Secondary | ICD-10-CM | POA: Diagnosis not present

## 2017-01-21 DIAGNOSIS — N186 End stage renal disease: Secondary | ICD-10-CM | POA: Diagnosis not present

## 2017-01-21 DIAGNOSIS — Z5189 Encounter for other specified aftercare: Secondary | ICD-10-CM | POA: Diagnosis not present

## 2017-01-21 DIAGNOSIS — E440 Moderate protein-calorie malnutrition: Secondary | ICD-10-CM | POA: Diagnosis not present

## 2017-01-22 DIAGNOSIS — D630 Anemia in neoplastic disease: Secondary | ICD-10-CM | POA: Diagnosis not present

## 2017-01-22 DIAGNOSIS — E440 Moderate protein-calorie malnutrition: Secondary | ICD-10-CM | POA: Diagnosis not present

## 2017-01-22 DIAGNOSIS — D509 Iron deficiency anemia, unspecified: Secondary | ICD-10-CM | POA: Diagnosis not present

## 2017-01-22 DIAGNOSIS — N186 End stage renal disease: Secondary | ICD-10-CM | POA: Diagnosis not present

## 2017-01-22 DIAGNOSIS — Z79899 Other long term (current) drug therapy: Secondary | ICD-10-CM | POA: Diagnosis not present

## 2017-01-22 DIAGNOSIS — Z5189 Encounter for other specified aftercare: Secondary | ICD-10-CM | POA: Diagnosis not present

## 2017-01-23 DIAGNOSIS — Z5189 Encounter for other specified aftercare: Secondary | ICD-10-CM | POA: Diagnosis not present

## 2017-01-23 DIAGNOSIS — D509 Iron deficiency anemia, unspecified: Secondary | ICD-10-CM | POA: Diagnosis not present

## 2017-01-23 DIAGNOSIS — D630 Anemia in neoplastic disease: Secondary | ICD-10-CM | POA: Diagnosis not present

## 2017-01-23 DIAGNOSIS — N186 End stage renal disease: Secondary | ICD-10-CM | POA: Diagnosis not present

## 2017-01-23 DIAGNOSIS — Z79899 Other long term (current) drug therapy: Secondary | ICD-10-CM | POA: Diagnosis not present

## 2017-01-23 DIAGNOSIS — E440 Moderate protein-calorie malnutrition: Secondary | ICD-10-CM | POA: Diagnosis not present

## 2017-01-24 DIAGNOSIS — N186 End stage renal disease: Secondary | ICD-10-CM | POA: Diagnosis not present

## 2017-01-24 DIAGNOSIS — D630 Anemia in neoplastic disease: Secondary | ICD-10-CM | POA: Diagnosis not present

## 2017-01-24 DIAGNOSIS — Z5189 Encounter for other specified aftercare: Secondary | ICD-10-CM | POA: Diagnosis not present

## 2017-01-24 DIAGNOSIS — Z79899 Other long term (current) drug therapy: Secondary | ICD-10-CM | POA: Diagnosis not present

## 2017-01-24 DIAGNOSIS — E440 Moderate protein-calorie malnutrition: Secondary | ICD-10-CM | POA: Diagnosis not present

## 2017-01-24 DIAGNOSIS — D509 Iron deficiency anemia, unspecified: Secondary | ICD-10-CM | POA: Diagnosis not present

## 2017-01-25 DIAGNOSIS — D630 Anemia in neoplastic disease: Secondary | ICD-10-CM | POA: Diagnosis not present

## 2017-01-25 DIAGNOSIS — Z79899 Other long term (current) drug therapy: Secondary | ICD-10-CM | POA: Diagnosis not present

## 2017-01-25 DIAGNOSIS — D509 Iron deficiency anemia, unspecified: Secondary | ICD-10-CM | POA: Diagnosis not present

## 2017-01-25 DIAGNOSIS — Z5189 Encounter for other specified aftercare: Secondary | ICD-10-CM | POA: Diagnosis not present

## 2017-01-25 DIAGNOSIS — N186 End stage renal disease: Secondary | ICD-10-CM | POA: Diagnosis not present

## 2017-01-25 DIAGNOSIS — E440 Moderate protein-calorie malnutrition: Secondary | ICD-10-CM | POA: Diagnosis not present

## 2017-01-26 DIAGNOSIS — Z79899 Other long term (current) drug therapy: Secondary | ICD-10-CM | POA: Diagnosis not present

## 2017-01-26 DIAGNOSIS — D630 Anemia in neoplastic disease: Secondary | ICD-10-CM | POA: Diagnosis not present

## 2017-01-26 DIAGNOSIS — E440 Moderate protein-calorie malnutrition: Secondary | ICD-10-CM | POA: Diagnosis not present

## 2017-01-26 DIAGNOSIS — D509 Iron deficiency anemia, unspecified: Secondary | ICD-10-CM | POA: Diagnosis not present

## 2017-01-26 DIAGNOSIS — Z5189 Encounter for other specified aftercare: Secondary | ICD-10-CM | POA: Diagnosis not present

## 2017-01-26 DIAGNOSIS — N186 End stage renal disease: Secondary | ICD-10-CM | POA: Diagnosis not present

## 2017-01-27 DIAGNOSIS — E440 Moderate protein-calorie malnutrition: Secondary | ICD-10-CM | POA: Diagnosis not present

## 2017-01-27 DIAGNOSIS — D630 Anemia in neoplastic disease: Secondary | ICD-10-CM | POA: Diagnosis not present

## 2017-01-27 DIAGNOSIS — N186 End stage renal disease: Secondary | ICD-10-CM | POA: Diagnosis not present

## 2017-01-27 DIAGNOSIS — Z79899 Other long term (current) drug therapy: Secondary | ICD-10-CM | POA: Diagnosis not present

## 2017-01-27 DIAGNOSIS — D509 Iron deficiency anemia, unspecified: Secondary | ICD-10-CM | POA: Diagnosis not present

## 2017-01-27 DIAGNOSIS — Z5189 Encounter for other specified aftercare: Secondary | ICD-10-CM | POA: Diagnosis not present

## 2017-01-28 ENCOUNTER — Other Ambulatory Visit (HOSPITAL_COMMUNITY): Payer: Self-pay | Admitting: Transplant Surgery

## 2017-01-28 DIAGNOSIS — E440 Moderate protein-calorie malnutrition: Secondary | ICD-10-CM | POA: Diagnosis not present

## 2017-01-28 DIAGNOSIS — Z79899 Other long term (current) drug therapy: Secondary | ICD-10-CM | POA: Diagnosis not present

## 2017-01-28 DIAGNOSIS — D509 Iron deficiency anemia, unspecified: Secondary | ICD-10-CM | POA: Diagnosis not present

## 2017-01-28 DIAGNOSIS — Z949 Transplanted organ and tissue status, unspecified: Secondary | ICD-10-CM

## 2017-01-28 DIAGNOSIS — N186 End stage renal disease: Secondary | ICD-10-CM | POA: Diagnosis not present

## 2017-01-28 DIAGNOSIS — D630 Anemia in neoplastic disease: Secondary | ICD-10-CM | POA: Diagnosis not present

## 2017-01-28 DIAGNOSIS — Z5189 Encounter for other specified aftercare: Secondary | ICD-10-CM | POA: Diagnosis not present

## 2017-01-29 DIAGNOSIS — Z5189 Encounter for other specified aftercare: Secondary | ICD-10-CM | POA: Diagnosis not present

## 2017-01-29 DIAGNOSIS — E440 Moderate protein-calorie malnutrition: Secondary | ICD-10-CM | POA: Diagnosis not present

## 2017-01-29 DIAGNOSIS — N186 End stage renal disease: Secondary | ICD-10-CM | POA: Diagnosis not present

## 2017-01-29 DIAGNOSIS — Z79899 Other long term (current) drug therapy: Secondary | ICD-10-CM | POA: Diagnosis not present

## 2017-01-29 DIAGNOSIS — D630 Anemia in neoplastic disease: Secondary | ICD-10-CM | POA: Diagnosis not present

## 2017-01-29 DIAGNOSIS — D509 Iron deficiency anemia, unspecified: Secondary | ICD-10-CM | POA: Diagnosis not present

## 2017-01-30 DIAGNOSIS — Z5189 Encounter for other specified aftercare: Secondary | ICD-10-CM | POA: Diagnosis not present

## 2017-01-30 DIAGNOSIS — D509 Iron deficiency anemia, unspecified: Secondary | ICD-10-CM | POA: Diagnosis not present

## 2017-01-30 DIAGNOSIS — N186 End stage renal disease: Secondary | ICD-10-CM | POA: Diagnosis not present

## 2017-01-30 DIAGNOSIS — Z79899 Other long term (current) drug therapy: Secondary | ICD-10-CM | POA: Diagnosis not present

## 2017-01-30 DIAGNOSIS — E440 Moderate protein-calorie malnutrition: Secondary | ICD-10-CM | POA: Diagnosis not present

## 2017-01-30 DIAGNOSIS — D630 Anemia in neoplastic disease: Secondary | ICD-10-CM | POA: Diagnosis not present

## 2017-01-31 DIAGNOSIS — E440 Moderate protein-calorie malnutrition: Secondary | ICD-10-CM | POA: Diagnosis not present

## 2017-01-31 DIAGNOSIS — D630 Anemia in neoplastic disease: Secondary | ICD-10-CM | POA: Diagnosis not present

## 2017-01-31 DIAGNOSIS — N186 End stage renal disease: Secondary | ICD-10-CM | POA: Diagnosis not present

## 2017-01-31 DIAGNOSIS — Z5189 Encounter for other specified aftercare: Secondary | ICD-10-CM | POA: Diagnosis not present

## 2017-01-31 DIAGNOSIS — D509 Iron deficiency anemia, unspecified: Secondary | ICD-10-CM | POA: Diagnosis not present

## 2017-01-31 DIAGNOSIS — Z79899 Other long term (current) drug therapy: Secondary | ICD-10-CM | POA: Diagnosis not present

## 2017-02-01 DIAGNOSIS — D509 Iron deficiency anemia, unspecified: Secondary | ICD-10-CM | POA: Diagnosis not present

## 2017-02-01 DIAGNOSIS — N186 End stage renal disease: Secondary | ICD-10-CM | POA: Diagnosis not present

## 2017-02-01 DIAGNOSIS — D630 Anemia in neoplastic disease: Secondary | ICD-10-CM | POA: Diagnosis not present

## 2017-02-01 DIAGNOSIS — E440 Moderate protein-calorie malnutrition: Secondary | ICD-10-CM | POA: Diagnosis not present

## 2017-02-01 DIAGNOSIS — Z79899 Other long term (current) drug therapy: Secondary | ICD-10-CM | POA: Diagnosis not present

## 2017-02-01 DIAGNOSIS — Z5189 Encounter for other specified aftercare: Secondary | ICD-10-CM | POA: Diagnosis not present

## 2017-02-02 DIAGNOSIS — Z79899 Other long term (current) drug therapy: Secondary | ICD-10-CM | POA: Diagnosis not present

## 2017-02-02 DIAGNOSIS — D509 Iron deficiency anemia, unspecified: Secondary | ICD-10-CM | POA: Diagnosis not present

## 2017-02-02 DIAGNOSIS — N186 End stage renal disease: Secondary | ICD-10-CM | POA: Diagnosis not present

## 2017-02-02 DIAGNOSIS — E440 Moderate protein-calorie malnutrition: Secondary | ICD-10-CM | POA: Diagnosis not present

## 2017-02-02 DIAGNOSIS — Z5189 Encounter for other specified aftercare: Secondary | ICD-10-CM | POA: Diagnosis not present

## 2017-02-02 DIAGNOSIS — D630 Anemia in neoplastic disease: Secondary | ICD-10-CM | POA: Diagnosis not present

## 2017-02-03 DIAGNOSIS — N186 End stage renal disease: Secondary | ICD-10-CM | POA: Diagnosis not present

## 2017-02-03 DIAGNOSIS — D630 Anemia in neoplastic disease: Secondary | ICD-10-CM | POA: Diagnosis not present

## 2017-02-03 DIAGNOSIS — Z5189 Encounter for other specified aftercare: Secondary | ICD-10-CM | POA: Diagnosis not present

## 2017-02-03 DIAGNOSIS — D509 Iron deficiency anemia, unspecified: Secondary | ICD-10-CM | POA: Diagnosis not present

## 2017-02-03 DIAGNOSIS — E440 Moderate protein-calorie malnutrition: Secondary | ICD-10-CM | POA: Diagnosis not present

## 2017-02-03 DIAGNOSIS — Z79899 Other long term (current) drug therapy: Secondary | ICD-10-CM | POA: Diagnosis not present

## 2017-02-04 ENCOUNTER — Ambulatory Visit (HOSPITAL_COMMUNITY)
Admission: RE | Admit: 2017-02-04 | Discharge: 2017-02-04 | Disposition: A | Discharge status: Home / Self Care | Payer: Medicare Other | Source: Ambulatory Visit | Attending: Family Medicine | Admitting: Family Medicine

## 2017-02-04 DIAGNOSIS — Z79899 Other long term (current) drug therapy: Secondary | ICD-10-CM | POA: Diagnosis not present

## 2017-02-04 DIAGNOSIS — N186 End stage renal disease: Secondary | ICD-10-CM | POA: Diagnosis not present

## 2017-02-04 DIAGNOSIS — I119 Hypertensive heart disease without heart failure: Secondary | ICD-10-CM | POA: Diagnosis not present

## 2017-02-04 DIAGNOSIS — D509 Iron deficiency anemia, unspecified: Secondary | ICD-10-CM | POA: Diagnosis not present

## 2017-02-04 DIAGNOSIS — I10 Essential (primary) hypertension: Secondary | ICD-10-CM | POA: Diagnosis not present

## 2017-02-04 DIAGNOSIS — D630 Anemia in neoplastic disease: Secondary | ICD-10-CM | POA: Diagnosis not present

## 2017-02-04 DIAGNOSIS — Z5189 Encounter for other specified aftercare: Secondary | ICD-10-CM | POA: Diagnosis not present

## 2017-02-04 DIAGNOSIS — E440 Moderate protein-calorie malnutrition: Secondary | ICD-10-CM | POA: Diagnosis not present

## 2017-02-04 DIAGNOSIS — Z949 Transplanted organ and tissue status, unspecified: Secondary | ICD-10-CM

## 2017-02-04 NOTE — Progress Notes (Signed)
Echocardiogram 2D Echocardiogram has been performed.  Meghan Richardson 02/04/2017, 3:15 PM

## 2017-02-05 DIAGNOSIS — E440 Moderate protein-calorie malnutrition: Secondary | ICD-10-CM | POA: Diagnosis not present

## 2017-02-05 DIAGNOSIS — Z5189 Encounter for other specified aftercare: Secondary | ICD-10-CM | POA: Diagnosis not present

## 2017-02-05 DIAGNOSIS — N186 End stage renal disease: Secondary | ICD-10-CM | POA: Diagnosis not present

## 2017-02-05 DIAGNOSIS — Z79899 Other long term (current) drug therapy: Secondary | ICD-10-CM | POA: Diagnosis not present

## 2017-02-05 DIAGNOSIS — D509 Iron deficiency anemia, unspecified: Secondary | ICD-10-CM | POA: Diagnosis not present

## 2017-02-05 DIAGNOSIS — D630 Anemia in neoplastic disease: Secondary | ICD-10-CM | POA: Diagnosis not present

## 2017-02-06 DIAGNOSIS — D630 Anemia in neoplastic disease: Secondary | ICD-10-CM | POA: Diagnosis not present

## 2017-02-06 DIAGNOSIS — Z5189 Encounter for other specified aftercare: Secondary | ICD-10-CM | POA: Diagnosis not present

## 2017-02-06 DIAGNOSIS — E440 Moderate protein-calorie malnutrition: Secondary | ICD-10-CM | POA: Diagnosis not present

## 2017-02-06 DIAGNOSIS — D509 Iron deficiency anemia, unspecified: Secondary | ICD-10-CM | POA: Diagnosis not present

## 2017-02-06 DIAGNOSIS — Z79899 Other long term (current) drug therapy: Secondary | ICD-10-CM | POA: Diagnosis not present

## 2017-02-06 DIAGNOSIS — N186 End stage renal disease: Secondary | ICD-10-CM | POA: Diagnosis not present

## 2017-02-07 DIAGNOSIS — Z5189 Encounter for other specified aftercare: Secondary | ICD-10-CM | POA: Diagnosis not present

## 2017-02-07 DIAGNOSIS — D509 Iron deficiency anemia, unspecified: Secondary | ICD-10-CM | POA: Diagnosis not present

## 2017-02-07 DIAGNOSIS — E440 Moderate protein-calorie malnutrition: Secondary | ICD-10-CM | POA: Diagnosis not present

## 2017-02-07 DIAGNOSIS — Z79899 Other long term (current) drug therapy: Secondary | ICD-10-CM | POA: Diagnosis not present

## 2017-02-07 DIAGNOSIS — D630 Anemia in neoplastic disease: Secondary | ICD-10-CM | POA: Diagnosis not present

## 2017-02-07 DIAGNOSIS — N186 End stage renal disease: Secondary | ICD-10-CM | POA: Diagnosis not present

## 2017-02-08 DIAGNOSIS — N186 End stage renal disease: Secondary | ICD-10-CM | POA: Diagnosis not present

## 2017-02-08 DIAGNOSIS — E440 Moderate protein-calorie malnutrition: Secondary | ICD-10-CM | POA: Diagnosis not present

## 2017-02-08 DIAGNOSIS — D509 Iron deficiency anemia, unspecified: Secondary | ICD-10-CM | POA: Diagnosis not present

## 2017-02-08 DIAGNOSIS — Z79899 Other long term (current) drug therapy: Secondary | ICD-10-CM | POA: Diagnosis not present

## 2017-02-08 DIAGNOSIS — Z5189 Encounter for other specified aftercare: Secondary | ICD-10-CM | POA: Diagnosis not present

## 2017-02-08 DIAGNOSIS — D630 Anemia in neoplastic disease: Secondary | ICD-10-CM | POA: Diagnosis not present

## 2017-02-09 DIAGNOSIS — Z79899 Other long term (current) drug therapy: Secondary | ICD-10-CM | POA: Diagnosis not present

## 2017-02-09 DIAGNOSIS — E440 Moderate protein-calorie malnutrition: Secondary | ICD-10-CM | POA: Diagnosis not present

## 2017-02-09 DIAGNOSIS — N186 End stage renal disease: Secondary | ICD-10-CM | POA: Diagnosis not present

## 2017-02-09 DIAGNOSIS — D509 Iron deficiency anemia, unspecified: Secondary | ICD-10-CM | POA: Diagnosis not present

## 2017-02-09 DIAGNOSIS — D630 Anemia in neoplastic disease: Secondary | ICD-10-CM | POA: Diagnosis not present

## 2017-02-09 DIAGNOSIS — Z5189 Encounter for other specified aftercare: Secondary | ICD-10-CM | POA: Diagnosis not present

## 2017-02-10 DIAGNOSIS — N186 End stage renal disease: Secondary | ICD-10-CM | POA: Diagnosis not present

## 2017-02-10 DIAGNOSIS — E440 Moderate protein-calorie malnutrition: Secondary | ICD-10-CM | POA: Diagnosis not present

## 2017-02-10 DIAGNOSIS — D509 Iron deficiency anemia, unspecified: Secondary | ICD-10-CM | POA: Diagnosis not present

## 2017-02-10 DIAGNOSIS — Z79899 Other long term (current) drug therapy: Secondary | ICD-10-CM | POA: Diagnosis not present

## 2017-02-10 DIAGNOSIS — D630 Anemia in neoplastic disease: Secondary | ICD-10-CM | POA: Diagnosis not present

## 2017-02-10 DIAGNOSIS — Z5189 Encounter for other specified aftercare: Secondary | ICD-10-CM | POA: Diagnosis not present

## 2017-02-11 DIAGNOSIS — E440 Moderate protein-calorie malnutrition: Secondary | ICD-10-CM | POA: Diagnosis not present

## 2017-02-11 DIAGNOSIS — Z5189 Encounter for other specified aftercare: Secondary | ICD-10-CM | POA: Diagnosis not present

## 2017-02-11 DIAGNOSIS — N186 End stage renal disease: Secondary | ICD-10-CM | POA: Diagnosis not present

## 2017-02-11 DIAGNOSIS — D630 Anemia in neoplastic disease: Secondary | ICD-10-CM | POA: Diagnosis not present

## 2017-02-11 DIAGNOSIS — D509 Iron deficiency anemia, unspecified: Secondary | ICD-10-CM | POA: Diagnosis not present

## 2017-02-11 DIAGNOSIS — Z79899 Other long term (current) drug therapy: Secondary | ICD-10-CM | POA: Diagnosis not present

## 2017-02-12 DIAGNOSIS — D509 Iron deficiency anemia, unspecified: Secondary | ICD-10-CM | POA: Diagnosis not present

## 2017-02-12 DIAGNOSIS — Z5189 Encounter for other specified aftercare: Secondary | ICD-10-CM | POA: Diagnosis not present

## 2017-02-12 DIAGNOSIS — D630 Anemia in neoplastic disease: Secondary | ICD-10-CM | POA: Diagnosis not present

## 2017-02-12 DIAGNOSIS — N186 End stage renal disease: Secondary | ICD-10-CM | POA: Diagnosis not present

## 2017-02-12 DIAGNOSIS — E440 Moderate protein-calorie malnutrition: Secondary | ICD-10-CM | POA: Diagnosis not present

## 2017-02-12 DIAGNOSIS — Z79899 Other long term (current) drug therapy: Secondary | ICD-10-CM | POA: Diagnosis not present

## 2017-02-13 DIAGNOSIS — E440 Moderate protein-calorie malnutrition: Secondary | ICD-10-CM | POA: Diagnosis not present

## 2017-02-13 DIAGNOSIS — Z992 Dependence on renal dialysis: Secondary | ICD-10-CM | POA: Diagnosis not present

## 2017-02-13 DIAGNOSIS — N186 End stage renal disease: Secondary | ICD-10-CM | POA: Diagnosis not present

## 2017-02-13 DIAGNOSIS — D630 Anemia in neoplastic disease: Secondary | ICD-10-CM | POA: Diagnosis not present

## 2017-02-13 DIAGNOSIS — Z79899 Other long term (current) drug therapy: Secondary | ICD-10-CM | POA: Diagnosis not present

## 2017-02-13 DIAGNOSIS — Z5189 Encounter for other specified aftercare: Secondary | ICD-10-CM | POA: Diagnosis not present

## 2017-02-13 DIAGNOSIS — D509 Iron deficiency anemia, unspecified: Secondary | ICD-10-CM | POA: Diagnosis not present

## 2017-02-14 DIAGNOSIS — N2581 Secondary hyperparathyroidism of renal origin: Secondary | ICD-10-CM | POA: Diagnosis not present

## 2017-02-14 DIAGNOSIS — E440 Moderate protein-calorie malnutrition: Secondary | ICD-10-CM | POA: Diagnosis not present

## 2017-02-14 DIAGNOSIS — Z79899 Other long term (current) drug therapy: Secondary | ICD-10-CM | POA: Diagnosis not present

## 2017-02-14 DIAGNOSIS — N2589 Other disorders resulting from impaired renal tubular function: Secondary | ICD-10-CM | POA: Diagnosis not present

## 2017-02-14 DIAGNOSIS — Z5189 Encounter for other specified aftercare: Secondary | ICD-10-CM | POA: Diagnosis not present

## 2017-02-14 DIAGNOSIS — N186 End stage renal disease: Secondary | ICD-10-CM | POA: Diagnosis not present

## 2017-02-14 DIAGNOSIS — D509 Iron deficiency anemia, unspecified: Secondary | ICD-10-CM | POA: Diagnosis not present

## 2017-02-14 DIAGNOSIS — D630 Anemia in neoplastic disease: Secondary | ICD-10-CM | POA: Diagnosis not present

## 2017-02-15 DIAGNOSIS — Z5189 Encounter for other specified aftercare: Secondary | ICD-10-CM | POA: Diagnosis not present

## 2017-02-15 DIAGNOSIS — E440 Moderate protein-calorie malnutrition: Secondary | ICD-10-CM | POA: Diagnosis not present

## 2017-02-15 DIAGNOSIS — N2581 Secondary hyperparathyroidism of renal origin: Secondary | ICD-10-CM | POA: Diagnosis not present

## 2017-02-15 DIAGNOSIS — N186 End stage renal disease: Secondary | ICD-10-CM | POA: Diagnosis not present

## 2017-02-15 DIAGNOSIS — Z79899 Other long term (current) drug therapy: Secondary | ICD-10-CM | POA: Diagnosis not present

## 2017-02-15 DIAGNOSIS — D509 Iron deficiency anemia, unspecified: Secondary | ICD-10-CM | POA: Diagnosis not present

## 2017-02-16 DIAGNOSIS — Z5189 Encounter for other specified aftercare: Secondary | ICD-10-CM | POA: Diagnosis not present

## 2017-02-16 DIAGNOSIS — N2581 Secondary hyperparathyroidism of renal origin: Secondary | ICD-10-CM | POA: Diagnosis not present

## 2017-02-16 DIAGNOSIS — N186 End stage renal disease: Secondary | ICD-10-CM | POA: Diagnosis not present

## 2017-02-16 DIAGNOSIS — E440 Moderate protein-calorie malnutrition: Secondary | ICD-10-CM | POA: Diagnosis not present

## 2017-02-16 DIAGNOSIS — Z79899 Other long term (current) drug therapy: Secondary | ICD-10-CM | POA: Diagnosis not present

## 2017-02-16 DIAGNOSIS — D509 Iron deficiency anemia, unspecified: Secondary | ICD-10-CM | POA: Diagnosis not present

## 2017-02-17 DIAGNOSIS — Z79899 Other long term (current) drug therapy: Secondary | ICD-10-CM | POA: Diagnosis not present

## 2017-02-17 DIAGNOSIS — Z5189 Encounter for other specified aftercare: Secondary | ICD-10-CM | POA: Diagnosis not present

## 2017-02-17 DIAGNOSIS — N2581 Secondary hyperparathyroidism of renal origin: Secondary | ICD-10-CM | POA: Diagnosis not present

## 2017-02-17 DIAGNOSIS — N186 End stage renal disease: Secondary | ICD-10-CM | POA: Diagnosis not present

## 2017-02-17 DIAGNOSIS — D509 Iron deficiency anemia, unspecified: Secondary | ICD-10-CM | POA: Diagnosis not present

## 2017-02-17 DIAGNOSIS — E440 Moderate protein-calorie malnutrition: Secondary | ICD-10-CM | POA: Diagnosis not present

## 2017-02-18 DIAGNOSIS — N186 End stage renal disease: Secondary | ICD-10-CM | POA: Diagnosis not present

## 2017-02-18 DIAGNOSIS — N2581 Secondary hyperparathyroidism of renal origin: Secondary | ICD-10-CM | POA: Diagnosis not present

## 2017-02-18 DIAGNOSIS — Z5189 Encounter for other specified aftercare: Secondary | ICD-10-CM | POA: Diagnosis not present

## 2017-02-18 DIAGNOSIS — Z79899 Other long term (current) drug therapy: Secondary | ICD-10-CM | POA: Diagnosis not present

## 2017-02-18 DIAGNOSIS — E440 Moderate protein-calorie malnutrition: Secondary | ICD-10-CM | POA: Diagnosis not present

## 2017-02-18 DIAGNOSIS — D509 Iron deficiency anemia, unspecified: Secondary | ICD-10-CM | POA: Diagnosis not present

## 2017-02-19 DIAGNOSIS — E440 Moderate protein-calorie malnutrition: Secondary | ICD-10-CM | POA: Diagnosis not present

## 2017-02-19 DIAGNOSIS — D509 Iron deficiency anemia, unspecified: Secondary | ICD-10-CM | POA: Diagnosis not present

## 2017-02-19 DIAGNOSIS — N2581 Secondary hyperparathyroidism of renal origin: Secondary | ICD-10-CM | POA: Diagnosis not present

## 2017-02-19 DIAGNOSIS — Z79899 Other long term (current) drug therapy: Secondary | ICD-10-CM | POA: Diagnosis not present

## 2017-02-19 DIAGNOSIS — Z5189 Encounter for other specified aftercare: Secondary | ICD-10-CM | POA: Diagnosis not present

## 2017-02-19 DIAGNOSIS — N186 End stage renal disease: Secondary | ICD-10-CM | POA: Diagnosis not present

## 2017-02-20 DIAGNOSIS — E440 Moderate protein-calorie malnutrition: Secondary | ICD-10-CM | POA: Diagnosis not present

## 2017-02-20 DIAGNOSIS — D509 Iron deficiency anemia, unspecified: Secondary | ICD-10-CM | POA: Diagnosis not present

## 2017-02-20 DIAGNOSIS — Z5189 Encounter for other specified aftercare: Secondary | ICD-10-CM | POA: Diagnosis not present

## 2017-02-20 DIAGNOSIS — N186 End stage renal disease: Secondary | ICD-10-CM | POA: Diagnosis not present

## 2017-02-20 DIAGNOSIS — Z79899 Other long term (current) drug therapy: Secondary | ICD-10-CM | POA: Diagnosis not present

## 2017-02-20 DIAGNOSIS — N2581 Secondary hyperparathyroidism of renal origin: Secondary | ICD-10-CM | POA: Diagnosis not present

## 2017-02-21 DIAGNOSIS — N2581 Secondary hyperparathyroidism of renal origin: Secondary | ICD-10-CM | POA: Diagnosis not present

## 2017-02-21 DIAGNOSIS — Z79899 Other long term (current) drug therapy: Secondary | ICD-10-CM | POA: Diagnosis not present

## 2017-02-21 DIAGNOSIS — R82998 Other abnormal findings in urine: Secondary | ICD-10-CM | POA: Diagnosis not present

## 2017-02-21 DIAGNOSIS — E440 Moderate protein-calorie malnutrition: Secondary | ICD-10-CM | POA: Diagnosis not present

## 2017-02-21 DIAGNOSIS — N186 End stage renal disease: Secondary | ICD-10-CM | POA: Diagnosis not present

## 2017-02-21 DIAGNOSIS — Z5189 Encounter for other specified aftercare: Secondary | ICD-10-CM | POA: Diagnosis not present

## 2017-02-21 DIAGNOSIS — D509 Iron deficiency anemia, unspecified: Secondary | ICD-10-CM | POA: Diagnosis not present

## 2017-02-22 DIAGNOSIS — N2581 Secondary hyperparathyroidism of renal origin: Secondary | ICD-10-CM | POA: Diagnosis not present

## 2017-02-22 DIAGNOSIS — Z5189 Encounter for other specified aftercare: Secondary | ICD-10-CM | POA: Diagnosis not present

## 2017-02-22 DIAGNOSIS — N186 End stage renal disease: Secondary | ICD-10-CM | POA: Diagnosis not present

## 2017-02-22 DIAGNOSIS — Z79899 Other long term (current) drug therapy: Secondary | ICD-10-CM | POA: Diagnosis not present

## 2017-02-22 DIAGNOSIS — D509 Iron deficiency anemia, unspecified: Secondary | ICD-10-CM | POA: Diagnosis not present

## 2017-02-22 DIAGNOSIS — E440 Moderate protein-calorie malnutrition: Secondary | ICD-10-CM | POA: Diagnosis not present

## 2017-02-23 DIAGNOSIS — Z5189 Encounter for other specified aftercare: Secondary | ICD-10-CM | POA: Diagnosis not present

## 2017-02-23 DIAGNOSIS — Z79899 Other long term (current) drug therapy: Secondary | ICD-10-CM | POA: Diagnosis not present

## 2017-02-23 DIAGNOSIS — D509 Iron deficiency anemia, unspecified: Secondary | ICD-10-CM | POA: Diagnosis not present

## 2017-02-23 DIAGNOSIS — N186 End stage renal disease: Secondary | ICD-10-CM | POA: Diagnosis not present

## 2017-02-23 DIAGNOSIS — N2581 Secondary hyperparathyroidism of renal origin: Secondary | ICD-10-CM | POA: Diagnosis not present

## 2017-02-23 DIAGNOSIS — E440 Moderate protein-calorie malnutrition: Secondary | ICD-10-CM | POA: Diagnosis not present

## 2017-02-24 DIAGNOSIS — D509 Iron deficiency anemia, unspecified: Secondary | ICD-10-CM | POA: Diagnosis not present

## 2017-02-24 DIAGNOSIS — N2581 Secondary hyperparathyroidism of renal origin: Secondary | ICD-10-CM | POA: Diagnosis not present

## 2017-02-24 DIAGNOSIS — Z79899 Other long term (current) drug therapy: Secondary | ICD-10-CM | POA: Diagnosis not present

## 2017-02-24 DIAGNOSIS — Z5189 Encounter for other specified aftercare: Secondary | ICD-10-CM | POA: Diagnosis not present

## 2017-02-24 DIAGNOSIS — N186 End stage renal disease: Secondary | ICD-10-CM | POA: Diagnosis not present

## 2017-02-24 DIAGNOSIS — E440 Moderate protein-calorie malnutrition: Secondary | ICD-10-CM | POA: Diagnosis not present

## 2017-02-25 DIAGNOSIS — N186 End stage renal disease: Secondary | ICD-10-CM | POA: Diagnosis not present

## 2017-02-25 DIAGNOSIS — Z5189 Encounter for other specified aftercare: Secondary | ICD-10-CM | POA: Diagnosis not present

## 2017-02-25 DIAGNOSIS — Z79899 Other long term (current) drug therapy: Secondary | ICD-10-CM | POA: Diagnosis not present

## 2017-02-25 DIAGNOSIS — D509 Iron deficiency anemia, unspecified: Secondary | ICD-10-CM | POA: Diagnosis not present

## 2017-02-25 DIAGNOSIS — N2581 Secondary hyperparathyroidism of renal origin: Secondary | ICD-10-CM | POA: Diagnosis not present

## 2017-02-25 DIAGNOSIS — E440 Moderate protein-calorie malnutrition: Secondary | ICD-10-CM | POA: Diagnosis not present

## 2017-02-26 DIAGNOSIS — N186 End stage renal disease: Secondary | ICD-10-CM | POA: Diagnosis not present

## 2017-02-26 DIAGNOSIS — Z5189 Encounter for other specified aftercare: Secondary | ICD-10-CM | POA: Diagnosis not present

## 2017-02-26 DIAGNOSIS — Z79899 Other long term (current) drug therapy: Secondary | ICD-10-CM | POA: Diagnosis not present

## 2017-02-26 DIAGNOSIS — D509 Iron deficiency anemia, unspecified: Secondary | ICD-10-CM | POA: Diagnosis not present

## 2017-02-26 DIAGNOSIS — E440 Moderate protein-calorie malnutrition: Secondary | ICD-10-CM | POA: Diagnosis not present

## 2017-02-26 DIAGNOSIS — N2581 Secondary hyperparathyroidism of renal origin: Secondary | ICD-10-CM | POA: Diagnosis not present

## 2017-02-27 DIAGNOSIS — N2581 Secondary hyperparathyroidism of renal origin: Secondary | ICD-10-CM | POA: Diagnosis not present

## 2017-02-27 DIAGNOSIS — Z5189 Encounter for other specified aftercare: Secondary | ICD-10-CM | POA: Diagnosis not present

## 2017-02-27 DIAGNOSIS — N186 End stage renal disease: Secondary | ICD-10-CM | POA: Diagnosis not present

## 2017-02-27 DIAGNOSIS — Z79899 Other long term (current) drug therapy: Secondary | ICD-10-CM | POA: Diagnosis not present

## 2017-02-27 DIAGNOSIS — E440 Moderate protein-calorie malnutrition: Secondary | ICD-10-CM | POA: Diagnosis not present

## 2017-02-27 DIAGNOSIS — D509 Iron deficiency anemia, unspecified: Secondary | ICD-10-CM | POA: Diagnosis not present

## 2017-02-28 DIAGNOSIS — D509 Iron deficiency anemia, unspecified: Secondary | ICD-10-CM | POA: Diagnosis not present

## 2017-02-28 DIAGNOSIS — Z5189 Encounter for other specified aftercare: Secondary | ICD-10-CM | POA: Diagnosis not present

## 2017-02-28 DIAGNOSIS — N186 End stage renal disease: Secondary | ICD-10-CM | POA: Diagnosis not present

## 2017-02-28 DIAGNOSIS — N2581 Secondary hyperparathyroidism of renal origin: Secondary | ICD-10-CM | POA: Diagnosis not present

## 2017-02-28 DIAGNOSIS — E440 Moderate protein-calorie malnutrition: Secondary | ICD-10-CM | POA: Diagnosis not present

## 2017-02-28 DIAGNOSIS — Z79899 Other long term (current) drug therapy: Secondary | ICD-10-CM | POA: Diagnosis not present

## 2017-03-01 DIAGNOSIS — D573 Sickle-cell trait: Secondary | ICD-10-CM | POA: Diagnosis present

## 2017-03-01 DIAGNOSIS — Z4902 Encounter for fitting and adjustment of peritoneal dialysis catheter: Secondary | ICD-10-CM | POA: Diagnosis not present

## 2017-03-01 DIAGNOSIS — Z992 Dependence on renal dialysis: Secondary | ICD-10-CM | POA: Diagnosis not present

## 2017-03-01 DIAGNOSIS — E872 Acidosis: Secondary | ICD-10-CM | POA: Diagnosis not present

## 2017-03-01 DIAGNOSIS — I120 Hypertensive chronic kidney disease with stage 5 chronic kidney disease or end stage renal disease: Secondary | ICD-10-CM | POA: Diagnosis not present

## 2017-03-01 DIAGNOSIS — Z4822 Encounter for aftercare following kidney transplant: Secondary | ICD-10-CM | POA: Diagnosis not present

## 2017-03-01 DIAGNOSIS — Z832 Family history of diseases of the blood and blood-forming organs and certain disorders involving the immune mechanism: Secondary | ICD-10-CM | POA: Diagnosis not present

## 2017-03-01 DIAGNOSIS — D631 Anemia in chronic kidney disease: Secondary | ICD-10-CM | POA: Diagnosis present

## 2017-03-01 DIAGNOSIS — Z01818 Encounter for other preprocedural examination: Secondary | ICD-10-CM | POA: Diagnosis not present

## 2017-03-01 DIAGNOSIS — Z940 Kidney transplant status: Secondary | ICD-10-CM | POA: Diagnosis not present

## 2017-03-01 DIAGNOSIS — N041 Nephrotic syndrome with focal and segmental glomerular lesions: Secondary | ICD-10-CM | POA: Diagnosis not present

## 2017-03-01 DIAGNOSIS — N269 Renal sclerosis, unspecified: Secondary | ICD-10-CM | POA: Diagnosis present

## 2017-03-01 DIAGNOSIS — Z7982 Long term (current) use of aspirin: Secondary | ICD-10-CM | POA: Diagnosis not present

## 2017-03-01 DIAGNOSIS — Z5181 Encounter for therapeutic drug level monitoring: Secondary | ICD-10-CM | POA: Diagnosis not present

## 2017-03-01 DIAGNOSIS — Z833 Family history of diabetes mellitus: Secondary | ICD-10-CM | POA: Diagnosis not present

## 2017-03-01 DIAGNOSIS — I129 Hypertensive chronic kidney disease with stage 1 through stage 4 chronic kidney disease, or unspecified chronic kidney disease: Secondary | ICD-10-CM | POA: Diagnosis not present

## 2017-03-01 DIAGNOSIS — N2581 Secondary hyperparathyroidism of renal origin: Secondary | ICD-10-CM | POA: Diagnosis present

## 2017-03-01 DIAGNOSIS — Z803 Family history of malignant neoplasm of breast: Secondary | ICD-10-CM | POA: Diagnosis not present

## 2017-03-01 DIAGNOSIS — N051 Unspecified nephritic syndrome with focal and segmental glomerular lesions: Secondary | ICD-10-CM | POA: Diagnosis not present

## 2017-03-01 DIAGNOSIS — I10 Essential (primary) hypertension: Secondary | ICD-10-CM | POA: Diagnosis present

## 2017-03-01 DIAGNOSIS — D62 Acute posthemorrhagic anemia: Secondary | ICD-10-CM | POA: Diagnosis not present

## 2017-03-01 DIAGNOSIS — Z79899 Other long term (current) drug therapy: Secondary | ICD-10-CM | POA: Diagnosis not present

## 2017-03-01 DIAGNOSIS — N19 Unspecified kidney failure: Secondary | ICD-10-CM | POA: Diagnosis not present

## 2017-03-01 DIAGNOSIS — E878 Other disorders of electrolyte and fluid balance, not elsewhere classified: Secondary | ICD-10-CM | POA: Diagnosis not present

## 2017-03-01 DIAGNOSIS — Z87891 Personal history of nicotine dependence: Secondary | ICD-10-CM | POA: Diagnosis not present

## 2017-03-01 DIAGNOSIS — N186 End stage renal disease: Secondary | ICD-10-CM | POA: Diagnosis present

## 2017-03-07 DIAGNOSIS — Z13220 Encounter for screening for lipoid disorders: Secondary | ICD-10-CM | POA: Diagnosis not present

## 2017-03-07 DIAGNOSIS — N186 End stage renal disease: Secondary | ICD-10-CM | POA: Diagnosis not present

## 2017-03-07 DIAGNOSIS — D649 Anemia, unspecified: Secondary | ICD-10-CM | POA: Diagnosis not present

## 2017-03-07 DIAGNOSIS — Z940 Kidney transplant status: Secondary | ICD-10-CM | POA: Diagnosis not present

## 2017-03-07 DIAGNOSIS — N2581 Secondary hyperparathyroidism of renal origin: Secondary | ICD-10-CM | POA: Diagnosis not present

## 2017-03-07 DIAGNOSIS — Z131 Encounter for screening for diabetes mellitus: Secondary | ICD-10-CM | POA: Diagnosis not present

## 2017-03-07 DIAGNOSIS — I120 Hypertensive chronic kidney disease with stage 5 chronic kidney disease or end stage renal disease: Secondary | ICD-10-CM | POA: Diagnosis not present

## 2017-03-07 DIAGNOSIS — I10 Essential (primary) hypertension: Secondary | ICD-10-CM | POA: Diagnosis not present

## 2017-03-07 DIAGNOSIS — Z79899 Other long term (current) drug therapy: Secondary | ICD-10-CM | POA: Diagnosis not present

## 2017-03-07 DIAGNOSIS — E213 Hyperparathyroidism, unspecified: Secondary | ICD-10-CM | POA: Diagnosis not present

## 2017-03-07 DIAGNOSIS — D8989 Other specified disorders involving the immune mechanism, not elsewhere classified: Secondary | ICD-10-CM | POA: Diagnosis not present

## 2017-03-07 DIAGNOSIS — Z7952 Long term (current) use of systemic steroids: Secondary | ICD-10-CM | POA: Diagnosis not present

## 2017-03-11 DIAGNOSIS — Z13220 Encounter for screening for lipoid disorders: Secondary | ICD-10-CM | POA: Diagnosis not present

## 2017-03-11 DIAGNOSIS — Z131 Encounter for screening for diabetes mellitus: Secondary | ICD-10-CM | POA: Diagnosis not present

## 2017-03-11 DIAGNOSIS — D8989 Other specified disorders involving the immune mechanism, not elsewhere classified: Secondary | ICD-10-CM | POA: Diagnosis not present

## 2017-03-11 DIAGNOSIS — E213 Hyperparathyroidism, unspecified: Secondary | ICD-10-CM | POA: Diagnosis not present

## 2017-03-11 DIAGNOSIS — Z792 Long term (current) use of antibiotics: Secondary | ICD-10-CM | POA: Diagnosis not present

## 2017-03-11 DIAGNOSIS — Z79899 Other long term (current) drug therapy: Secondary | ICD-10-CM | POA: Diagnosis not present

## 2017-03-11 DIAGNOSIS — I10 Essential (primary) hypertension: Secondary | ICD-10-CM | POA: Diagnosis not present

## 2017-03-11 DIAGNOSIS — Z4822 Encounter for aftercare following kidney transplant: Secondary | ICD-10-CM | POA: Diagnosis not present

## 2017-03-11 DIAGNOSIS — N2581 Secondary hyperparathyroidism of renal origin: Secondary | ICD-10-CM | POA: Diagnosis not present

## 2017-03-11 DIAGNOSIS — D649 Anemia, unspecified: Secondary | ICD-10-CM | POA: Diagnosis not present

## 2017-03-11 DIAGNOSIS — Z955 Presence of coronary angioplasty implant and graft: Secondary | ICD-10-CM | POA: Diagnosis not present

## 2017-03-11 DIAGNOSIS — Z940 Kidney transplant status: Secondary | ICD-10-CM | POA: Diagnosis not present

## 2017-03-14 DIAGNOSIS — E213 Hyperparathyroidism, unspecified: Secondary | ICD-10-CM | POA: Diagnosis not present

## 2017-03-14 DIAGNOSIS — N186 End stage renal disease: Secondary | ICD-10-CM | POA: Diagnosis not present

## 2017-03-14 DIAGNOSIS — I120 Hypertensive chronic kidney disease with stage 5 chronic kidney disease or end stage renal disease: Secondary | ICD-10-CM | POA: Diagnosis not present

## 2017-03-14 DIAGNOSIS — D8989 Other specified disorders involving the immune mechanism, not elsewhere classified: Secondary | ICD-10-CM | POA: Diagnosis not present

## 2017-03-14 DIAGNOSIS — Z79899 Other long term (current) drug therapy: Secondary | ICD-10-CM | POA: Diagnosis not present

## 2017-03-14 DIAGNOSIS — Z131 Encounter for screening for diabetes mellitus: Secondary | ICD-10-CM | POA: Diagnosis not present

## 2017-03-14 DIAGNOSIS — I10 Essential (primary) hypertension: Secondary | ICD-10-CM | POA: Diagnosis not present

## 2017-03-14 DIAGNOSIS — D649 Anemia, unspecified: Secondary | ICD-10-CM | POA: Diagnosis not present

## 2017-03-14 DIAGNOSIS — E785 Hyperlipidemia, unspecified: Secondary | ICD-10-CM | POA: Diagnosis not present

## 2017-03-14 DIAGNOSIS — Z940 Kidney transplant status: Secondary | ICD-10-CM | POA: Diagnosis not present

## 2017-03-14 DIAGNOSIS — N2581 Secondary hyperparathyroidism of renal origin: Secondary | ICD-10-CM | POA: Diagnosis not present

## 2017-03-18 DIAGNOSIS — Z940 Kidney transplant status: Secondary | ICD-10-CM | POA: Diagnosis not present

## 2017-03-18 DIAGNOSIS — I10 Essential (primary) hypertension: Secondary | ICD-10-CM | POA: Diagnosis not present

## 2017-03-18 DIAGNOSIS — R61 Generalized hyperhidrosis: Secondary | ICD-10-CM | POA: Diagnosis not present

## 2017-03-18 DIAGNOSIS — N2581 Secondary hyperparathyroidism of renal origin: Secondary | ICD-10-CM | POA: Diagnosis not present

## 2017-03-18 DIAGNOSIS — Z4822 Encounter for aftercare following kidney transplant: Secondary | ICD-10-CM | POA: Diagnosis not present

## 2017-03-18 DIAGNOSIS — Z131 Encounter for screening for diabetes mellitus: Secondary | ICD-10-CM | POA: Diagnosis not present

## 2017-03-18 DIAGNOSIS — D649 Anemia, unspecified: Secondary | ICD-10-CM | POA: Diagnosis not present

## 2017-03-18 DIAGNOSIS — D8989 Other specified disorders involving the immune mechanism, not elsewhere classified: Secondary | ICD-10-CM | POA: Diagnosis not present

## 2017-03-18 DIAGNOSIS — Z79899 Other long term (current) drug therapy: Secondary | ICD-10-CM | POA: Diagnosis not present

## 2017-03-18 DIAGNOSIS — Z792 Long term (current) use of antibiotics: Secondary | ICD-10-CM | POA: Diagnosis not present

## 2017-03-18 DIAGNOSIS — Z13220 Encounter for screening for lipoid disorders: Secondary | ICD-10-CM | POA: Diagnosis not present

## 2017-03-18 DIAGNOSIS — K0889 Other specified disorders of teeth and supporting structures: Secondary | ICD-10-CM | POA: Diagnosis not present

## 2017-03-18 DIAGNOSIS — E213 Hyperparathyroidism, unspecified: Secondary | ICD-10-CM | POA: Diagnosis not present

## 2017-03-21 DIAGNOSIS — Z131 Encounter for screening for diabetes mellitus: Secondary | ICD-10-CM | POA: Diagnosis not present

## 2017-03-21 DIAGNOSIS — Z79899 Other long term (current) drug therapy: Secondary | ICD-10-CM | POA: Diagnosis not present

## 2017-03-21 DIAGNOSIS — Z13220 Encounter for screening for lipoid disorders: Secondary | ICD-10-CM | POA: Diagnosis not present

## 2017-03-21 DIAGNOSIS — Z940 Kidney transplant status: Secondary | ICD-10-CM | POA: Diagnosis not present

## 2017-03-21 DIAGNOSIS — I120 Hypertensive chronic kidney disease with stage 5 chronic kidney disease or end stage renal disease: Secondary | ICD-10-CM | POA: Diagnosis not present

## 2017-03-21 DIAGNOSIS — N186 End stage renal disease: Secondary | ICD-10-CM | POA: Diagnosis not present

## 2017-03-21 DIAGNOSIS — D8989 Other specified disorders involving the immune mechanism, not elsewhere classified: Secondary | ICD-10-CM | POA: Diagnosis not present

## 2017-03-21 DIAGNOSIS — N2581 Secondary hyperparathyroidism of renal origin: Secondary | ICD-10-CM | POA: Diagnosis not present

## 2017-03-21 DIAGNOSIS — D649 Anemia, unspecified: Secondary | ICD-10-CM | POA: Diagnosis not present

## 2017-03-21 DIAGNOSIS — K047 Periapical abscess without sinus: Secondary | ICD-10-CM | POA: Diagnosis not present

## 2017-03-21 DIAGNOSIS — E213 Hyperparathyroidism, unspecified: Secondary | ICD-10-CM | POA: Diagnosis not present

## 2017-03-21 DIAGNOSIS — K0889 Other specified disorders of teeth and supporting structures: Secondary | ICD-10-CM | POA: Diagnosis not present

## 2017-03-21 DIAGNOSIS — I10 Essential (primary) hypertension: Secondary | ICD-10-CM | POA: Diagnosis not present

## 2017-03-24 DIAGNOSIS — B9620 Unspecified Escherichia coli [E. coli] as the cause of diseases classified elsewhere: Secondary | ICD-10-CM | POA: Diagnosis not present

## 2017-03-24 DIAGNOSIS — Z792 Long term (current) use of antibiotics: Secondary | ICD-10-CM | POA: Diagnosis not present

## 2017-03-24 DIAGNOSIS — D649 Anemia, unspecified: Secondary | ICD-10-CM | POA: Diagnosis not present

## 2017-03-24 DIAGNOSIS — N390 Urinary tract infection, site not specified: Secondary | ICD-10-CM | POA: Diagnosis not present

## 2017-03-24 DIAGNOSIS — N2581 Secondary hyperparathyroidism of renal origin: Secondary | ICD-10-CM | POA: Diagnosis not present

## 2017-03-24 DIAGNOSIS — E213 Hyperparathyroidism, unspecified: Secondary | ICD-10-CM | POA: Diagnosis not present

## 2017-03-24 DIAGNOSIS — Z940 Kidney transplant status: Secondary | ICD-10-CM | POA: Diagnosis not present

## 2017-03-24 DIAGNOSIS — D8989 Other specified disorders involving the immune mechanism, not elsewhere classified: Secondary | ICD-10-CM | POA: Diagnosis not present

## 2017-03-24 DIAGNOSIS — Z131 Encounter for screening for diabetes mellitus: Secondary | ICD-10-CM | POA: Diagnosis not present

## 2017-03-24 DIAGNOSIS — Z13220 Encounter for screening for lipoid disorders: Secondary | ICD-10-CM | POA: Diagnosis not present

## 2017-03-24 DIAGNOSIS — Z7952 Long term (current) use of systemic steroids: Secondary | ICD-10-CM | POA: Diagnosis not present

## 2017-03-24 DIAGNOSIS — I10 Essential (primary) hypertension: Secondary | ICD-10-CM | POA: Diagnosis not present

## 2017-03-24 DIAGNOSIS — Z4822 Encounter for aftercare following kidney transplant: Secondary | ICD-10-CM | POA: Diagnosis not present

## 2017-03-24 DIAGNOSIS — K047 Periapical abscess without sinus: Secondary | ICD-10-CM | POA: Diagnosis not present

## 2017-03-24 DIAGNOSIS — Z466 Encounter for fitting and adjustment of urinary device: Secondary | ICD-10-CM | POA: Diagnosis not present

## 2017-03-24 DIAGNOSIS — Z79899 Other long term (current) drug therapy: Secondary | ICD-10-CM | POA: Diagnosis not present

## 2017-03-28 DIAGNOSIS — Z7952 Long term (current) use of systemic steroids: Secondary | ICD-10-CM | POA: Diagnosis not present

## 2017-03-28 DIAGNOSIS — Z13220 Encounter for screening for lipoid disorders: Secondary | ICD-10-CM | POA: Diagnosis not present

## 2017-03-28 DIAGNOSIS — K047 Periapical abscess without sinus: Secondary | ICD-10-CM | POA: Diagnosis not present

## 2017-03-28 DIAGNOSIS — D649 Anemia, unspecified: Secondary | ICD-10-CM | POA: Diagnosis not present

## 2017-03-28 DIAGNOSIS — Z79899 Other long term (current) drug therapy: Secondary | ICD-10-CM | POA: Diagnosis not present

## 2017-03-28 DIAGNOSIS — E785 Hyperlipidemia, unspecified: Secondary | ICD-10-CM | POA: Diagnosis not present

## 2017-03-28 DIAGNOSIS — D72819 Decreased white blood cell count, unspecified: Secondary | ICD-10-CM | POA: Diagnosis not present

## 2017-03-28 DIAGNOSIS — Z792 Long term (current) use of antibiotics: Secondary | ICD-10-CM | POA: Diagnosis not present

## 2017-03-28 DIAGNOSIS — N390 Urinary tract infection, site not specified: Secondary | ICD-10-CM | POA: Diagnosis not present

## 2017-03-28 DIAGNOSIS — B9620 Unspecified Escherichia coli [E. coli] as the cause of diseases classified elsewhere: Secondary | ICD-10-CM | POA: Diagnosis not present

## 2017-03-28 DIAGNOSIS — D8989 Other specified disorders involving the immune mechanism, not elsewhere classified: Secondary | ICD-10-CM | POA: Diagnosis not present

## 2017-03-28 DIAGNOSIS — I10 Essential (primary) hypertension: Secondary | ICD-10-CM | POA: Diagnosis not present

## 2017-03-28 DIAGNOSIS — Z131 Encounter for screening for diabetes mellitus: Secondary | ICD-10-CM | POA: Diagnosis not present

## 2017-03-28 DIAGNOSIS — Z940 Kidney transplant status: Secondary | ICD-10-CM | POA: Diagnosis not present

## 2017-03-28 DIAGNOSIS — Z4822 Encounter for aftercare following kidney transplant: Secondary | ICD-10-CM | POA: Diagnosis not present

## 2017-03-31 DIAGNOSIS — Z79899 Other long term (current) drug therapy: Secondary | ICD-10-CM | POA: Diagnosis not present

## 2017-03-31 DIAGNOSIS — D8989 Other specified disorders involving the immune mechanism, not elsewhere classified: Secondary | ICD-10-CM | POA: Diagnosis not present

## 2017-03-31 DIAGNOSIS — I10 Essential (primary) hypertension: Secondary | ICD-10-CM | POA: Diagnosis not present

## 2017-03-31 DIAGNOSIS — K047 Periapical abscess without sinus: Secondary | ICD-10-CM | POA: Diagnosis not present

## 2017-03-31 DIAGNOSIS — Z131 Encounter for screening for diabetes mellitus: Secondary | ICD-10-CM | POA: Diagnosis not present

## 2017-03-31 DIAGNOSIS — Z7952 Long term (current) use of systemic steroids: Secondary | ICD-10-CM | POA: Diagnosis not present

## 2017-03-31 DIAGNOSIS — N051 Unspecified nephritic syndrome with focal and segmental glomerular lesions: Secondary | ICD-10-CM | POA: Diagnosis not present

## 2017-03-31 DIAGNOSIS — Z792 Long term (current) use of antibiotics: Secondary | ICD-10-CM | POA: Diagnosis not present

## 2017-03-31 DIAGNOSIS — Z940 Kidney transplant status: Secondary | ICD-10-CM | POA: Diagnosis not present

## 2017-03-31 DIAGNOSIS — D649 Anemia, unspecified: Secondary | ICD-10-CM | POA: Diagnosis not present

## 2017-03-31 DIAGNOSIS — Z4822 Encounter for aftercare following kidney transplant: Secondary | ICD-10-CM | POA: Diagnosis not present

## 2017-03-31 DIAGNOSIS — D72819 Decreased white blood cell count, unspecified: Secondary | ICD-10-CM | POA: Diagnosis not present

## 2017-03-31 DIAGNOSIS — E785 Hyperlipidemia, unspecified: Secondary | ICD-10-CM | POA: Diagnosis not present

## 2017-03-31 DIAGNOSIS — D573 Sickle-cell trait: Secondary | ICD-10-CM | POA: Diagnosis not present

## 2017-03-31 DIAGNOSIS — Z13220 Encounter for screening for lipoid disorders: Secondary | ICD-10-CM | POA: Diagnosis not present

## 2017-04-03 DIAGNOSIS — Z8719 Personal history of other diseases of the digestive system: Secondary | ICD-10-CM | POA: Diagnosis not present

## 2017-04-03 DIAGNOSIS — Z7952 Long term (current) use of systemic steroids: Secondary | ICD-10-CM | POA: Diagnosis not present

## 2017-04-03 DIAGNOSIS — N051 Unspecified nephritic syndrome with focal and segmental glomerular lesions: Secondary | ICD-10-CM | POA: Diagnosis not present

## 2017-04-03 DIAGNOSIS — Z5181 Encounter for therapeutic drug level monitoring: Secondary | ICD-10-CM | POA: Diagnosis not present

## 2017-04-03 DIAGNOSIS — Z792 Long term (current) use of antibiotics: Secondary | ICD-10-CM | POA: Diagnosis not present

## 2017-04-03 DIAGNOSIS — N041 Nephrotic syndrome with focal and segmental glomerular lesions: Secondary | ICD-10-CM | POA: Diagnosis not present

## 2017-04-03 DIAGNOSIS — Z87440 Personal history of urinary (tract) infections: Secondary | ICD-10-CM | POA: Diagnosis not present

## 2017-04-03 DIAGNOSIS — Z940 Kidney transplant status: Secondary | ICD-10-CM | POA: Diagnosis not present

## 2017-04-03 DIAGNOSIS — D8989 Other specified disorders involving the immune mechanism, not elsewhere classified: Secondary | ICD-10-CM | POA: Diagnosis not present

## 2017-04-03 DIAGNOSIS — Z79899 Other long term (current) drug therapy: Secondary | ICD-10-CM | POA: Diagnosis not present

## 2017-04-03 DIAGNOSIS — Z4822 Encounter for aftercare following kidney transplant: Secondary | ICD-10-CM | POA: Diagnosis not present

## 2017-04-08 DIAGNOSIS — Z87891 Personal history of nicotine dependence: Secondary | ICD-10-CM | POA: Diagnosis not present

## 2017-04-08 DIAGNOSIS — D72819 Decreased white blood cell count, unspecified: Secondary | ICD-10-CM | POA: Diagnosis not present

## 2017-04-08 DIAGNOSIS — D8989 Other specified disorders involving the immune mechanism, not elsewhere classified: Secondary | ICD-10-CM | POA: Diagnosis not present

## 2017-04-08 DIAGNOSIS — Z131 Encounter for screening for diabetes mellitus: Secondary | ICD-10-CM | POA: Diagnosis not present

## 2017-04-08 DIAGNOSIS — N390 Urinary tract infection, site not specified: Secondary | ICD-10-CM | POA: Diagnosis not present

## 2017-04-08 DIAGNOSIS — Z79899 Other long term (current) drug therapy: Secondary | ICD-10-CM | POA: Diagnosis not present

## 2017-04-08 DIAGNOSIS — Z940 Kidney transplant status: Secondary | ICD-10-CM | POA: Diagnosis not present

## 2017-04-08 DIAGNOSIS — I10 Essential (primary) hypertension: Secondary | ICD-10-CM | POA: Diagnosis not present

## 2017-04-08 DIAGNOSIS — K047 Periapical abscess without sinus: Secondary | ICD-10-CM | POA: Diagnosis not present

## 2017-04-08 DIAGNOSIS — B9620 Unspecified Escherichia coli [E. coli] as the cause of diseases classified elsewhere: Secondary | ICD-10-CM | POA: Diagnosis not present

## 2017-04-08 DIAGNOSIS — D649 Anemia, unspecified: Secondary | ICD-10-CM | POA: Diagnosis not present

## 2017-04-08 DIAGNOSIS — Z7952 Long term (current) use of systemic steroids: Secondary | ICD-10-CM | POA: Diagnosis not present

## 2017-04-08 DIAGNOSIS — Z13220 Encounter for screening for lipoid disorders: Secondary | ICD-10-CM | POA: Diagnosis not present

## 2017-04-08 DIAGNOSIS — I120 Hypertensive chronic kidney disease with stage 5 chronic kidney disease or end stage renal disease: Secondary | ICD-10-CM | POA: Diagnosis not present

## 2017-04-08 DIAGNOSIS — Z4822 Encounter for aftercare following kidney transplant: Secondary | ICD-10-CM | POA: Diagnosis not present

## 2017-04-08 DIAGNOSIS — B9629 Other Escherichia coli [E. coli] as the cause of diseases classified elsewhere: Secondary | ICD-10-CM | POA: Diagnosis not present

## 2017-04-08 DIAGNOSIS — Z792 Long term (current) use of antibiotics: Secondary | ICD-10-CM | POA: Diagnosis not present

## 2017-04-18 DIAGNOSIS — I10 Essential (primary) hypertension: Secondary | ICD-10-CM | POA: Diagnosis not present

## 2017-04-18 DIAGNOSIS — Z4822 Encounter for aftercare following kidney transplant: Secondary | ICD-10-CM | POA: Diagnosis not present

## 2017-04-18 DIAGNOSIS — B9789 Other viral agents as the cause of diseases classified elsewhere: Secondary | ICD-10-CM | POA: Diagnosis not present

## 2017-04-18 DIAGNOSIS — T8619 Other complication of kidney transplant: Secondary | ICD-10-CM | POA: Diagnosis not present

## 2017-04-18 DIAGNOSIS — Z7952 Long term (current) use of systemic steroids: Secondary | ICD-10-CM | POA: Diagnosis not present

## 2017-04-18 DIAGNOSIS — Z79899 Other long term (current) drug therapy: Secondary | ICD-10-CM | POA: Diagnosis not present

## 2017-04-18 DIAGNOSIS — I770 Arteriovenous fistula, acquired: Secondary | ICD-10-CM | POA: Diagnosis not present

## 2017-04-18 DIAGNOSIS — Z940 Kidney transplant status: Secondary | ICD-10-CM | POA: Diagnosis not present

## 2017-04-18 DIAGNOSIS — Z5181 Encounter for therapeutic drug level monitoring: Secondary | ICD-10-CM | POA: Diagnosis not present

## 2017-04-18 DIAGNOSIS — Z792 Long term (current) use of antibiotics: Secondary | ICD-10-CM | POA: Diagnosis not present

## 2017-04-18 DIAGNOSIS — E785 Hyperlipidemia, unspecified: Secondary | ICD-10-CM | POA: Diagnosis not present

## 2017-04-18 DIAGNOSIS — N390 Urinary tract infection, site not specified: Secondary | ICD-10-CM | POA: Diagnosis not present

## 2017-04-18 DIAGNOSIS — D72819 Decreased white blood cell count, unspecified: Secondary | ICD-10-CM | POA: Diagnosis not present

## 2017-04-18 DIAGNOSIS — Z131 Encounter for screening for diabetes mellitus: Secondary | ICD-10-CM | POA: Diagnosis not present

## 2017-04-18 DIAGNOSIS — D8989 Other specified disorders involving the immune mechanism, not elsewhere classified: Secondary | ICD-10-CM | POA: Diagnosis not present

## 2017-04-18 DIAGNOSIS — D649 Anemia, unspecified: Secondary | ICD-10-CM | POA: Diagnosis not present

## 2017-04-21 DIAGNOSIS — Z940 Kidney transplant status: Secondary | ICD-10-CM | POA: Diagnosis not present

## 2017-04-21 DIAGNOSIS — T8691 Unspecified transplanted organ and tissue rejection: Secondary | ICD-10-CM | POA: Diagnosis not present

## 2017-04-21 DIAGNOSIS — Z79899 Other long term (current) drug therapy: Secondary | ICD-10-CM | POA: Diagnosis not present

## 2017-04-21 DIAGNOSIS — N051 Unspecified nephritic syndrome with focal and segmental glomerular lesions: Secondary | ICD-10-CM | POA: Diagnosis not present

## 2017-04-22 DIAGNOSIS — N390 Urinary tract infection, site not specified: Secondary | ICD-10-CM | POA: Diagnosis not present

## 2017-04-22 DIAGNOSIS — Z7952 Long term (current) use of systemic steroids: Secondary | ICD-10-CM | POA: Diagnosis not present

## 2017-04-22 DIAGNOSIS — T8619 Other complication of kidney transplant: Secondary | ICD-10-CM | POA: Diagnosis not present

## 2017-04-22 DIAGNOSIS — Z79899 Other long term (current) drug therapy: Secondary | ICD-10-CM | POA: Diagnosis not present

## 2017-04-22 DIAGNOSIS — D8989 Other specified disorders involving the immune mechanism, not elsewhere classified: Secondary | ICD-10-CM | POA: Diagnosis not present

## 2017-04-22 DIAGNOSIS — K047 Periapical abscess without sinus: Secondary | ICD-10-CM | POA: Diagnosis not present

## 2017-04-22 DIAGNOSIS — B349 Viral infection, unspecified: Secondary | ICD-10-CM | POA: Diagnosis not present

## 2017-04-22 DIAGNOSIS — B9620 Unspecified Escherichia coli [E. coli] as the cause of diseases classified elsewhere: Secondary | ICD-10-CM | POA: Diagnosis not present

## 2017-04-22 DIAGNOSIS — Z4822 Encounter for aftercare following kidney transplant: Secondary | ICD-10-CM | POA: Diagnosis not present

## 2017-04-22 DIAGNOSIS — B9789 Other viral agents as the cause of diseases classified elsewhere: Secondary | ICD-10-CM | POA: Diagnosis not present

## 2017-04-22 DIAGNOSIS — E785 Hyperlipidemia, unspecified: Secondary | ICD-10-CM | POA: Diagnosis not present

## 2017-04-22 DIAGNOSIS — I10 Essential (primary) hypertension: Secondary | ICD-10-CM | POA: Diagnosis not present

## 2017-04-22 DIAGNOSIS — D649 Anemia, unspecified: Secondary | ICD-10-CM | POA: Diagnosis not present

## 2017-04-22 DIAGNOSIS — Z940 Kidney transplant status: Secondary | ICD-10-CM | POA: Diagnosis not present

## 2017-04-22 DIAGNOSIS — Z131 Encounter for screening for diabetes mellitus: Secondary | ICD-10-CM | POA: Diagnosis not present

## 2017-04-22 DIAGNOSIS — D72819 Decreased white blood cell count, unspecified: Secondary | ICD-10-CM | POA: Diagnosis not present

## 2017-04-24 DIAGNOSIS — Z5181 Encounter for therapeutic drug level monitoring: Secondary | ICD-10-CM | POA: Diagnosis not present

## 2017-04-24 DIAGNOSIS — B9620 Unspecified Escherichia coli [E. coli] as the cause of diseases classified elsewhere: Secondary | ICD-10-CM | POA: Diagnosis not present

## 2017-04-24 DIAGNOSIS — Z7952 Long term (current) use of systemic steroids: Secondary | ICD-10-CM | POA: Diagnosis not present

## 2017-04-24 DIAGNOSIS — Z79899 Other long term (current) drug therapy: Secondary | ICD-10-CM | POA: Diagnosis not present

## 2017-04-24 DIAGNOSIS — T8611 Kidney transplant rejection: Secondary | ICD-10-CM | POA: Diagnosis not present

## 2017-04-24 DIAGNOSIS — N390 Urinary tract infection, site not specified: Secondary | ICD-10-CM | POA: Diagnosis not present

## 2017-04-24 DIAGNOSIS — Z4822 Encounter for aftercare following kidney transplant: Secondary | ICD-10-CM | POA: Diagnosis not present

## 2017-04-24 DIAGNOSIS — N051 Unspecified nephritic syndrome with focal and segmental glomerular lesions: Secondary | ICD-10-CM | POA: Diagnosis not present

## 2017-04-28 DIAGNOSIS — Z79899 Other long term (current) drug therapy: Secondary | ICD-10-CM | POA: Diagnosis not present

## 2017-04-28 DIAGNOSIS — N051 Unspecified nephritic syndrome with focal and segmental glomerular lesions: Secondary | ICD-10-CM | POA: Diagnosis not present

## 2017-04-28 DIAGNOSIS — T8611 Kidney transplant rejection: Secondary | ICD-10-CM | POA: Diagnosis not present

## 2017-04-29 DIAGNOSIS — Z940 Kidney transplant status: Secondary | ICD-10-CM | POA: Diagnosis not present

## 2017-04-29 DIAGNOSIS — D649 Anemia, unspecified: Secondary | ICD-10-CM | POA: Diagnosis not present

## 2017-04-29 DIAGNOSIS — N390 Urinary tract infection, site not specified: Secondary | ICD-10-CM | POA: Diagnosis not present

## 2017-04-29 DIAGNOSIS — N051 Unspecified nephritic syndrome with focal and segmental glomerular lesions: Secondary | ICD-10-CM | POA: Diagnosis not present

## 2017-04-29 DIAGNOSIS — T8691 Unspecified transplanted organ and tissue rejection: Secondary | ICD-10-CM | POA: Diagnosis not present

## 2017-04-29 DIAGNOSIS — Z131 Encounter for screening for diabetes mellitus: Secondary | ICD-10-CM | POA: Diagnosis not present

## 2017-04-29 DIAGNOSIS — D899 Disorder involving the immune mechanism, unspecified: Secondary | ICD-10-CM | POA: Diagnosis not present

## 2017-04-29 DIAGNOSIS — D8989 Other specified disorders involving the immune mechanism, not elsewhere classified: Secondary | ICD-10-CM | POA: Diagnosis not present

## 2017-04-29 DIAGNOSIS — D72819 Decreased white blood cell count, unspecified: Secondary | ICD-10-CM | POA: Diagnosis not present

## 2017-04-29 DIAGNOSIS — Z13220 Encounter for screening for lipoid disorders: Secondary | ICD-10-CM | POA: Diagnosis not present

## 2017-04-29 DIAGNOSIS — K047 Periapical abscess without sinus: Secondary | ICD-10-CM | POA: Diagnosis not present

## 2017-04-29 DIAGNOSIS — B9789 Other viral agents as the cause of diseases classified elsewhere: Secondary | ICD-10-CM | POA: Diagnosis not present

## 2017-04-29 DIAGNOSIS — I10 Essential (primary) hypertension: Secondary | ICD-10-CM | POA: Diagnosis not present

## 2017-04-29 DIAGNOSIS — B9620 Unspecified Escherichia coli [E. coli] as the cause of diseases classified elsewhere: Secondary | ICD-10-CM | POA: Diagnosis not present

## 2017-04-29 DIAGNOSIS — Z79899 Other long term (current) drug therapy: Secondary | ICD-10-CM | POA: Diagnosis not present

## 2017-05-01 DIAGNOSIS — Z940 Kidney transplant status: Secondary | ICD-10-CM | POA: Diagnosis not present

## 2017-05-01 DIAGNOSIS — N051 Unspecified nephritic syndrome with focal and segmental glomerular lesions: Secondary | ICD-10-CM | POA: Diagnosis not present

## 2017-05-01 DIAGNOSIS — T8691 Unspecified transplanted organ and tissue rejection: Secondary | ICD-10-CM | POA: Diagnosis not present

## 2017-05-01 DIAGNOSIS — Z79899 Other long term (current) drug therapy: Secondary | ICD-10-CM | POA: Diagnosis not present

## 2017-05-05 DIAGNOSIS — Z4822 Encounter for aftercare following kidney transplant: Secondary | ICD-10-CM | POA: Diagnosis not present

## 2017-05-05 DIAGNOSIS — N051 Unspecified nephritic syndrome with focal and segmental glomerular lesions: Secondary | ICD-10-CM | POA: Diagnosis not present

## 2017-05-05 DIAGNOSIS — Z79899 Other long term (current) drug therapy: Secondary | ICD-10-CM | POA: Diagnosis not present

## 2017-05-05 DIAGNOSIS — T8691 Unspecified transplanted organ and tissue rejection: Secondary | ICD-10-CM | POA: Diagnosis not present

## 2017-05-06 DIAGNOSIS — Z7952 Long term (current) use of systemic steroids: Secondary | ICD-10-CM | POA: Diagnosis not present

## 2017-05-06 DIAGNOSIS — N051 Unspecified nephritic syndrome with focal and segmental glomerular lesions: Secondary | ICD-10-CM | POA: Diagnosis not present

## 2017-05-06 DIAGNOSIS — Z4822 Encounter for aftercare following kidney transplant: Secondary | ICD-10-CM | POA: Diagnosis not present

## 2017-05-06 DIAGNOSIS — D649 Anemia, unspecified: Secondary | ICD-10-CM | POA: Diagnosis not present

## 2017-05-06 DIAGNOSIS — Z792 Long term (current) use of antibiotics: Secondary | ICD-10-CM | POA: Diagnosis not present

## 2017-05-06 DIAGNOSIS — I10 Essential (primary) hypertension: Secondary | ICD-10-CM | POA: Diagnosis not present

## 2017-05-06 DIAGNOSIS — B349 Viral infection, unspecified: Secondary | ICD-10-CM | POA: Diagnosis not present

## 2017-05-06 DIAGNOSIS — Z87440 Personal history of urinary (tract) infections: Secondary | ICD-10-CM | POA: Diagnosis not present

## 2017-05-06 DIAGNOSIS — Z79899 Other long term (current) drug therapy: Secondary | ICD-10-CM | POA: Diagnosis not present

## 2017-05-06 DIAGNOSIS — T8611 Kidney transplant rejection: Secondary | ICD-10-CM | POA: Diagnosis not present

## 2017-05-06 DIAGNOSIS — D72819 Decreased white blood cell count, unspecified: Secondary | ICD-10-CM | POA: Diagnosis not present

## 2017-05-06 DIAGNOSIS — Z7982 Long term (current) use of aspirin: Secondary | ICD-10-CM | POA: Diagnosis not present

## 2017-05-06 DIAGNOSIS — E785 Hyperlipidemia, unspecified: Secondary | ICD-10-CM | POA: Diagnosis not present

## 2017-05-06 DIAGNOSIS — E649 Sequelae of unspecified nutritional deficiency: Secondary | ICD-10-CM | POA: Diagnosis not present

## 2017-05-06 DIAGNOSIS — B338 Other specified viral diseases: Secondary | ICD-10-CM | POA: Diagnosis not present

## 2017-05-06 DIAGNOSIS — Z940 Kidney transplant status: Secondary | ICD-10-CM | POA: Diagnosis not present

## 2017-05-08 DIAGNOSIS — N051 Unspecified nephritic syndrome with focal and segmental glomerular lesions: Secondary | ICD-10-CM | POA: Diagnosis not present

## 2017-05-08 DIAGNOSIS — Z79899 Other long term (current) drug therapy: Secondary | ICD-10-CM | POA: Diagnosis not present

## 2017-05-08 DIAGNOSIS — Z940 Kidney transplant status: Secondary | ICD-10-CM | POA: Diagnosis not present

## 2017-05-08 DIAGNOSIS — T8691 Unspecified transplanted organ and tissue rejection: Secondary | ICD-10-CM | POA: Diagnosis not present

## 2017-05-12 DIAGNOSIS — Z940 Kidney transplant status: Secondary | ICD-10-CM | POA: Diagnosis not present

## 2017-05-12 DIAGNOSIS — Z7952 Long term (current) use of systemic steroids: Secondary | ICD-10-CM | POA: Diagnosis not present

## 2017-05-12 DIAGNOSIS — N051 Unspecified nephritic syndrome with focal and segmental glomerular lesions: Secondary | ICD-10-CM | POA: Diagnosis not present

## 2017-05-12 DIAGNOSIS — D72819 Decreased white blood cell count, unspecified: Secondary | ICD-10-CM | POA: Diagnosis not present

## 2017-05-12 DIAGNOSIS — Z79899 Other long term (current) drug therapy: Secondary | ICD-10-CM | POA: Diagnosis not present

## 2017-05-12 DIAGNOSIS — D899 Disorder involving the immune mechanism, unspecified: Secondary | ICD-10-CM | POA: Diagnosis not present

## 2017-05-12 DIAGNOSIS — N390 Urinary tract infection, site not specified: Secondary | ICD-10-CM | POA: Diagnosis not present

## 2017-05-12 DIAGNOSIS — B349 Viral infection, unspecified: Secondary | ICD-10-CM | POA: Diagnosis not present

## 2017-05-12 DIAGNOSIS — D649 Anemia, unspecified: Secondary | ICD-10-CM | POA: Diagnosis not present

## 2017-05-12 DIAGNOSIS — E872 Acidosis: Secondary | ICD-10-CM | POA: Diagnosis not present

## 2017-05-12 DIAGNOSIS — Z09 Encounter for follow-up examination after completed treatment for conditions other than malignant neoplasm: Secondary | ICD-10-CM | POA: Diagnosis not present

## 2017-05-12 DIAGNOSIS — T8691 Unspecified transplanted organ and tissue rejection: Secondary | ICD-10-CM | POA: Diagnosis not present

## 2017-05-12 DIAGNOSIS — I10 Essential (primary) hypertension: Secondary | ICD-10-CM | POA: Diagnosis not present

## 2017-05-12 DIAGNOSIS — E785 Hyperlipidemia, unspecified: Secondary | ICD-10-CM | POA: Diagnosis not present

## 2017-05-13 DIAGNOSIS — E872 Acidosis: Secondary | ICD-10-CM | POA: Diagnosis not present

## 2017-05-13 DIAGNOSIS — N390 Urinary tract infection, site not specified: Secondary | ICD-10-CM | POA: Diagnosis not present

## 2017-05-13 DIAGNOSIS — Z792 Long term (current) use of antibiotics: Secondary | ICD-10-CM | POA: Diagnosis not present

## 2017-05-13 DIAGNOSIS — R509 Fever, unspecified: Secondary | ICD-10-CM | POA: Diagnosis not present

## 2017-05-13 DIAGNOSIS — I10 Essential (primary) hypertension: Secondary | ICD-10-CM | POA: Diagnosis not present

## 2017-05-13 DIAGNOSIS — D649 Anemia, unspecified: Secondary | ICD-10-CM | POA: Diagnosis not present

## 2017-05-15 DIAGNOSIS — Z79899 Other long term (current) drug therapy: Secondary | ICD-10-CM | POA: Diagnosis not present

## 2017-05-15 DIAGNOSIS — T8691 Unspecified transplanted organ and tissue rejection: Secondary | ICD-10-CM | POA: Diagnosis not present

## 2017-05-15 DIAGNOSIS — N051 Unspecified nephritic syndrome with focal and segmental glomerular lesions: Secondary | ICD-10-CM | POA: Diagnosis not present

## 2017-05-16 DIAGNOSIS — N261 Atrophy of kidney (terminal): Secondary | ICD-10-CM | POA: Diagnosis not present

## 2017-05-16 DIAGNOSIS — N269 Renal sclerosis, unspecified: Secondary | ICD-10-CM | POA: Diagnosis not present

## 2017-05-20 DIAGNOSIS — D649 Anemia, unspecified: Secondary | ICD-10-CM | POA: Diagnosis not present

## 2017-05-20 DIAGNOSIS — I10 Essential (primary) hypertension: Secondary | ICD-10-CM | POA: Diagnosis not present

## 2017-05-20 DIAGNOSIS — N390 Urinary tract infection, site not specified: Secondary | ICD-10-CM | POA: Diagnosis not present

## 2017-05-20 DIAGNOSIS — D72819 Decreased white blood cell count, unspecified: Secondary | ICD-10-CM | POA: Diagnosis not present

## 2017-05-20 DIAGNOSIS — D8989 Other specified disorders involving the immune mechanism, not elsewhere classified: Secondary | ICD-10-CM | POA: Diagnosis not present

## 2017-05-20 DIAGNOSIS — B9789 Other viral agents as the cause of diseases classified elsewhere: Secondary | ICD-10-CM | POA: Diagnosis not present

## 2017-05-20 DIAGNOSIS — E872 Acidosis: Secondary | ICD-10-CM | POA: Diagnosis not present

## 2017-05-20 DIAGNOSIS — Z13220 Encounter for screening for lipoid disorders: Secondary | ICD-10-CM | POA: Diagnosis not present

## 2017-05-20 DIAGNOSIS — Z940 Kidney transplant status: Secondary | ICD-10-CM | POA: Diagnosis not present

## 2017-05-20 DIAGNOSIS — Z131 Encounter for screening for diabetes mellitus: Secondary | ICD-10-CM | POA: Diagnosis not present

## 2017-05-20 DIAGNOSIS — Z4822 Encounter for aftercare following kidney transplant: Secondary | ICD-10-CM | POA: Diagnosis not present

## 2017-05-20 DIAGNOSIS — T8619 Other complication of kidney transplant: Secondary | ICD-10-CM | POA: Diagnosis not present

## 2017-05-27 DIAGNOSIS — Z4822 Encounter for aftercare following kidney transplant: Secondary | ICD-10-CM | POA: Diagnosis not present

## 2017-05-27 DIAGNOSIS — Z79899 Other long term (current) drug therapy: Secondary | ICD-10-CM | POA: Diagnosis not present

## 2017-05-27 DIAGNOSIS — Z940 Kidney transplant status: Secondary | ICD-10-CM | POA: Diagnosis not present

## 2017-05-27 DIAGNOSIS — D899 Disorder involving the immune mechanism, unspecified: Secondary | ICD-10-CM | POA: Diagnosis not present

## 2017-05-27 DIAGNOSIS — I10 Essential (primary) hypertension: Secondary | ICD-10-CM | POA: Diagnosis not present

## 2017-05-27 DIAGNOSIS — Z5181 Encounter for therapeutic drug level monitoring: Secondary | ICD-10-CM | POA: Diagnosis not present

## 2017-05-27 DIAGNOSIS — T8691 Unspecified transplanted organ and tissue rejection: Secondary | ICD-10-CM | POA: Diagnosis not present

## 2017-06-03 DIAGNOSIS — Z4822 Encounter for aftercare following kidney transplant: Secondary | ICD-10-CM | POA: Diagnosis not present

## 2017-06-03 DIAGNOSIS — T8691 Unspecified transplanted organ and tissue rejection: Secondary | ICD-10-CM | POA: Diagnosis not present

## 2017-06-03 DIAGNOSIS — T8611 Kidney transplant rejection: Secondary | ICD-10-CM | POA: Diagnosis not present

## 2017-06-03 DIAGNOSIS — Z79899 Other long term (current) drug therapy: Secondary | ICD-10-CM | POA: Diagnosis not present

## 2017-06-03 DIAGNOSIS — Z5181 Encounter for therapeutic drug level monitoring: Secondary | ICD-10-CM | POA: Diagnosis not present

## 2017-06-03 DIAGNOSIS — R8271 Bacteriuria: Secondary | ICD-10-CM | POA: Diagnosis not present

## 2017-06-03 DIAGNOSIS — Z940 Kidney transplant status: Secondary | ICD-10-CM | POA: Diagnosis not present

## 2017-06-03 DIAGNOSIS — N390 Urinary tract infection, site not specified: Secondary | ICD-10-CM | POA: Diagnosis not present

## 2017-06-03 DIAGNOSIS — I10 Essential (primary) hypertension: Secondary | ICD-10-CM | POA: Diagnosis not present

## 2017-06-03 DIAGNOSIS — N051 Unspecified nephritic syndrome with focal and segmental glomerular lesions: Secondary | ICD-10-CM | POA: Diagnosis not present

## 2017-06-03 DIAGNOSIS — D899 Disorder involving the immune mechanism, unspecified: Secondary | ICD-10-CM | POA: Diagnosis not present

## 2017-06-08 ENCOUNTER — Emergency Department
Admission: EM | Admit: 2017-06-08 | Discharge: 2017-06-08 | Disposition: A | Discharge status: Home / Self Care | Payer: Medicare Other | Attending: Emergency Medicine | Admitting: Emergency Medicine

## 2017-06-08 ENCOUNTER — Encounter: Payer: Self-pay | Admitting: Emergency Medicine

## 2017-06-08 DIAGNOSIS — I120 Hypertensive chronic kidney disease with stage 5 chronic kidney disease or end stage renal disease: Secondary | ICD-10-CM | POA: Insufficient documentation

## 2017-06-08 DIAGNOSIS — N3000 Acute cystitis without hematuria: Secondary | ICD-10-CM | POA: Insufficient documentation

## 2017-06-08 DIAGNOSIS — N186 End stage renal disease: Secondary | ICD-10-CM | POA: Diagnosis not present

## 2017-06-08 DIAGNOSIS — Z87891 Personal history of nicotine dependence: Secondary | ICD-10-CM | POA: Diagnosis not present

## 2017-06-08 DIAGNOSIS — R300 Dysuria: Secondary | ICD-10-CM | POA: Diagnosis not present

## 2017-06-08 DIAGNOSIS — R350 Frequency of micturition: Secondary | ICD-10-CM | POA: Diagnosis present

## 2017-06-08 LAB — CBC
HEMATOCRIT: 37 % (ref 35.0–47.0)
Hemoglobin: 11.9 g/dL — ABNORMAL LOW (ref 12.0–16.0)
MCH: 26.1 pg (ref 26.0–34.0)
MCHC: 32.3 g/dL (ref 32.0–36.0)
MCV: 80.9 fL (ref 80.0–100.0)
Platelets: 192 10*3/uL (ref 150–440)
RBC: 4.57 MIL/uL (ref 3.80–5.20)
RDW: 16.1 % — ABNORMAL HIGH (ref 11.5–14.5)
WBC: 9.1 10*3/uL (ref 3.6–11.0)

## 2017-06-08 LAB — URINALYSIS, COMPLETE (UACMP) WITH MICROSCOPIC
BILIRUBIN URINE: NEGATIVE
Glucose, UA: NEGATIVE mg/dL
KETONES UR: NEGATIVE mg/dL
Nitrite: POSITIVE — AB
Protein, ur: 100 mg/dL — AB
Specific Gravity, Urine: 1.01 (ref 1.005–1.030)
pH: 7 (ref 5.0–8.0)

## 2017-06-08 LAB — BASIC METABOLIC PANEL
Anion gap: 5 (ref 5–15)
BUN: 15 mg/dL (ref 6–20)
CALCIUM: 9.5 mg/dL (ref 8.9–10.3)
CO2: 23 mmol/L (ref 22–32)
CREATININE: 0.98 mg/dL (ref 0.44–1.00)
Chloride: 112 mmol/L — ABNORMAL HIGH (ref 101–111)
GFR calc non Af Amer: >60 mL/min (ref >60)
Glucose, Bld: 82 mg/dL (ref 65–99)
Potassium: 3.5 mmol/L (ref 3.5–5.1)
SODIUM: 140 mmol/L (ref 135–145)

## 2017-06-08 LAB — PREGNANCY, URINE: PREG TEST UR: NEGATIVE

## 2017-06-08 MED ORDER — CEFPODOXIME PROXETIL 200 MG PO TABS: 200.0000 mg | ORAL_TABLET | Freq: Two times a day (BID) | ORAL | 0 refills | Status: DC

## 2017-06-08 NOTE — ED Triage Notes (Addendum)
Pt comes into the ED via POV c/o urgency to urinate and other symptoms of a UTI.  Patient states she gets them frequently and they always feel this way.  Denies any discharge at this time.  Patient has suprapubic pain and mild lower back pain.  Patient has h/o right kidney transplant. Her MD at [facility] wants a urine culture sent off as well due to her h/o of UTIs in the past and her surgical history.

## 2017-06-08 NOTE — ED Provider Notes (Signed)
[facility] Emergency Department Provider Note       Time seen: ----------------------------------------- 1:04 PM on 06/08/2017 -----------------------------------------   I have reviewed the triage vital signs and the nursing notes.  HISTORY   Chief Complaint Urinary Frequency    HPI Meghan Richardson is a 37 y.o. female with a history of FSGS, hypertension, renal failure status post renal transplant who presents to the ED for urinary urgency and dysuria with recent UTI.  Patient states she gets them frequently and the always feel this way.  She denies any vaginal discharge or sexual activity in the last 3 years.  She does have some suprapubic pain but no lower back pain.  She has no tenderness over her transplant kidney, no fevers or chills.  Past Medical History:  Diagnosis Date  . FSGS (focal segmental glomerulosclerosis)    Reports Kidney Bx at age 65  . Hypertension     Patient Active Problem List   Diagnosis Date Noted  . Peritonitis associated with peritoneal dialysis ([place]) 10/03/2014  . Hypertension due to end stage renal disease on dialysis ([place]) 10/03/2014  . Anemia in chronic kidney disease (CKD) 10/03/2014    Past Surgical History:  Procedure Laterality Date  . AV FISTULA PLACEMENT      Allergies Patient has no known allergies.  Social History Social History   Tobacco Use  . Smoking status: Former Research scientist (life sciences)  . Smokeless tobacco: Never Used  Substance Use Topics  . Alcohol use: No    Alcohol/week: 0.0 oz  . Drug use: No   Review of Systems Constitutional: Negative for fever. Cardiovascular: Negative for chest pain. Respiratory: Negative for shortness of breath. Gastrointestinal: Negative for abdominal pain, vomiting and diarrhea. Genitourinary: Positive for dysuria Musculoskeletal: Negative for back pain. Skin: Negative for rash. Neurological: Negative for headaches, focal weakness or numbness.  All systems  negative/normal/unremarkable except as stated in the HPI  ____________________________________________   PHYSICAL EXAM:  VITAL SIGNS: ED Triage Vitals  Enc Vitals Group     BP 06/08/17 1109 131/74     Pulse Rate 06/08/17 1109 95     Resp 06/08/17 1109 16     Temp 06/08/17 1109 99 F (37.2 C)     Temp Source 06/08/17 1109 Oral     SpO2 06/08/17 1109 100 %     Weight 06/08/17 1110 135 lb (61.2 kg)     Height 06/08/17 1110 5\' 6"  (1.676 m)     Head Circumference --      Peak Flow --      Pain Score 06/08/17 1117 4     Pain Loc --      Pain Edu? --      Excl. in [place]? --    Constitutional: Alert and oriented. Well appearing and in no distress. Eyes: Conjunctivae are normal. Normal extraocular movements. ENT   Head: Normocephalic and atraumatic.   Nose: No congestion/rhinnorhea.   Mouth/Throat: Mucous membranes are moist.   Neck: No stridor. Cardiovascular: Normal rate, regular rhythm. No murmurs, rubs, or gallops. Respiratory: Normal respiratory effort without tachypnea nor retractions. Breath sounds are clear and equal bilaterally. No wheezes/rales/rhonchi. Gastrointestinal: Soft and nontender. Normal bowel sounds, transplant kidney site is nontender and unremarkable.  No CVA tenderness Musculoskeletal: Nontender with normal range of motion in extremities. No lower extremity tenderness nor edema. Neurologic:  Normal speech and language. No gross focal neurologic deficits are appreciated.  Skin:  Skin is warm, dry and intact. No rash noted. Psychiatric: Mood and  affect are normal. Speech and behavior are normal.  ____________________________________________  ED COURSE:  As part of my medical decision making, I reviewed the following data within the [place] History obtained from family if available, nursing notes, old chart and ekg, as well as notes from prior ED visits. Patient presented for dysuria, we will assess with labs as indicated at this  time.   Procedures ____________________________________________   LABS (pertinent positives/negatives)  Labs Reviewed  URINALYSIS, COMPLETE (UACMP) WITH MICROSCOPIC - Abnormal; Notable for the following components:      Result Value   Color, Urine YELLOW (*)    APPearance CLOUDY (*)    Hgb urine dipstick SMALL (*)    Protein, ur 100 (*)    Nitrite POSITIVE (*)    Leukocytes, UA LARGE (*)    WBC, UA >50 (*)    Bacteria, UA MANY (*)    Non Squamous Epithelial 0-5 (*)    All other components within normal limits  BASIC METABOLIC PANEL - Abnormal; Notable for the following components:   Chloride 112 (*)    All other components within normal limits  CBC - Abnormal; Notable for the following components:   Hemoglobin 11.9 (*)    RDW 16.1 (*)    All other components within normal limits  URINE CULTURE  PREGNANCY, URINE  ____________________________________________  DIFFERENTIAL DIAGNOSIS   UTI, polynephritis, graft rejection, sepsis  FINAL ASSESSMENT AND PLAN  UTI   Plan: The patient had presented for dysuria. Patient's labs did reflect urinary tract infection without other concerning findings.  I do not think she warrants admission or transfer as this appears to be a urinary tract infection, although complicated.  She was recently on Vantin.  I will attempt contact with her transplant team.   Laurence Aly, MD   Note: This note was generated in part or whole with voice recognition software. Voice recognition is usually quite accurate but there are transcription errors that can and very often do occur. I apologize for any typographical errors that were not detected and corrected.     Earleen Newport, MD 06/08/17 [phone]

## 2017-06-08 NOTE — ED Notes (Signed)
First Nurse Note: Pt to ED stating that she thinks that she may have a UTI. Pt is in NAD at this time.

## 2017-06-11 LAB — URINE CULTURE
Culture: >=100000 — AB
Special Requests: NORMAL

## 2017-06-17 DIAGNOSIS — Z4822 Encounter for aftercare following kidney transplant: Secondary | ICD-10-CM | POA: Diagnosis not present

## 2017-06-17 DIAGNOSIS — E871 Hypo-osmolality and hyponatremia: Secondary | ICD-10-CM | POA: Diagnosis not present

## 2017-06-17 DIAGNOSIS — D899 Disorder involving the immune mechanism, unspecified: Secondary | ICD-10-CM | POA: Diagnosis not present

## 2017-06-17 DIAGNOSIS — B9789 Other viral agents as the cause of diseases classified elsewhere: Secondary | ICD-10-CM | POA: Diagnosis not present

## 2017-06-17 DIAGNOSIS — I10 Essential (primary) hypertension: Secondary | ICD-10-CM | POA: Diagnosis not present

## 2017-06-17 DIAGNOSIS — B259 Cytomegaloviral disease, unspecified: Secondary | ICD-10-CM | POA: Diagnosis not present

## 2017-06-17 DIAGNOSIS — Z79899 Other long term (current) drug therapy: Secondary | ICD-10-CM | POA: Diagnosis not present

## 2017-06-17 DIAGNOSIS — Z940 Kidney transplant status: Secondary | ICD-10-CM | POA: Diagnosis not present

## 2017-06-17 DIAGNOSIS — Z5181 Encounter for therapeutic drug level monitoring: Secondary | ICD-10-CM | POA: Diagnosis not present

## 2017-06-17 DIAGNOSIS — N390 Urinary tract infection, site not specified: Secondary | ICD-10-CM | POA: Diagnosis not present

## 2017-07-02 DIAGNOSIS — T8611 Kidney transplant rejection: Secondary | ICD-10-CM | POA: Diagnosis not present

## 2017-07-02 DIAGNOSIS — Z79899 Other long term (current) drug therapy: Secondary | ICD-10-CM | POA: Diagnosis not present

## 2017-07-02 DIAGNOSIS — Z4822 Encounter for aftercare following kidney transplant: Secondary | ICD-10-CM | POA: Diagnosis not present

## 2017-07-02 DIAGNOSIS — Z792 Long term (current) use of antibiotics: Secondary | ICD-10-CM | POA: Diagnosis not present

## 2017-07-02 DIAGNOSIS — Z7952 Long term (current) use of systemic steroids: Secondary | ICD-10-CM | POA: Diagnosis not present

## 2017-07-02 DIAGNOSIS — Z87440 Personal history of urinary (tract) infections: Secondary | ICD-10-CM | POA: Diagnosis not present

## 2017-07-14 DIAGNOSIS — D899 Disorder involving the immune mechanism, unspecified: Secondary | ICD-10-CM | POA: Diagnosis not present

## 2017-07-14 DIAGNOSIS — Z940 Kidney transplant status: Secondary | ICD-10-CM | POA: Diagnosis not present

## 2017-07-14 DIAGNOSIS — N390 Urinary tract infection, site not specified: Secondary | ICD-10-CM | POA: Diagnosis not present

## 2017-07-14 DIAGNOSIS — Z3141 Encounter for fertility testing: Secondary | ICD-10-CM | POA: Diagnosis not present

## 2017-07-15 DIAGNOSIS — Z4822 Encounter for aftercare following kidney transplant: Secondary | ICD-10-CM | POA: Diagnosis not present

## 2017-07-15 DIAGNOSIS — Z5181 Encounter for therapeutic drug level monitoring: Secondary | ICD-10-CM | POA: Diagnosis not present

## 2017-07-15 DIAGNOSIS — T8619 Other complication of kidney transplant: Secondary | ICD-10-CM | POA: Diagnosis not present

## 2017-07-15 DIAGNOSIS — D8989 Other specified disorders involving the immune mechanism, not elsewhere classified: Secondary | ICD-10-CM | POA: Diagnosis not present

## 2017-07-15 DIAGNOSIS — Z131 Encounter for screening for diabetes mellitus: Secondary | ICD-10-CM | POA: Diagnosis not present

## 2017-07-15 DIAGNOSIS — I10 Essential (primary) hypertension: Secondary | ICD-10-CM | POA: Diagnosis not present

## 2017-07-15 DIAGNOSIS — D899 Disorder involving the immune mechanism, unspecified: Secondary | ICD-10-CM | POA: Diagnosis not present

## 2017-07-15 DIAGNOSIS — B9789 Other viral agents as the cause of diseases classified elsewhere: Secondary | ICD-10-CM | POA: Diagnosis not present

## 2017-07-15 DIAGNOSIS — Z79899 Other long term (current) drug therapy: Secondary | ICD-10-CM | POA: Diagnosis not present

## 2017-07-15 DIAGNOSIS — B258 Other cytomegaloviral diseases: Secondary | ICD-10-CM | POA: Diagnosis not present

## 2017-07-15 DIAGNOSIS — Z13220 Encounter for screening for lipoid disorders: Secondary | ICD-10-CM | POA: Diagnosis not present

## 2017-07-15 DIAGNOSIS — N390 Urinary tract infection, site not specified: Secondary | ICD-10-CM | POA: Diagnosis not present

## 2017-07-15 DIAGNOSIS — Z940 Kidney transplant status: Secondary | ICD-10-CM | POA: Diagnosis not present

## 2017-07-15 DIAGNOSIS — T8611 Kidney transplant rejection: Secondary | ICD-10-CM | POA: Diagnosis not present

## 2017-07-29 DIAGNOSIS — Z131 Encounter for screening for diabetes mellitus: Secondary | ICD-10-CM | POA: Diagnosis not present

## 2017-07-29 DIAGNOSIS — Z940 Kidney transplant status: Secondary | ICD-10-CM | POA: Diagnosis not present

## 2017-07-29 DIAGNOSIS — Z5181 Encounter for therapeutic drug level monitoring: Secondary | ICD-10-CM | POA: Diagnosis not present

## 2017-07-29 DIAGNOSIS — Z792 Long term (current) use of antibiotics: Secondary | ICD-10-CM | POA: Diagnosis not present

## 2017-07-29 DIAGNOSIS — B9789 Other viral agents as the cause of diseases classified elsewhere: Secondary | ICD-10-CM | POA: Diagnosis not present

## 2017-07-29 DIAGNOSIS — B349 Viral infection, unspecified: Secondary | ICD-10-CM | POA: Diagnosis not present

## 2017-07-29 DIAGNOSIS — Z87440 Personal history of urinary (tract) infections: Secondary | ICD-10-CM | POA: Diagnosis not present

## 2017-07-29 DIAGNOSIS — Z4822 Encounter for aftercare following kidney transplant: Secondary | ICD-10-CM | POA: Diagnosis not present

## 2017-07-29 DIAGNOSIS — D649 Anemia, unspecified: Secondary | ICD-10-CM | POA: Diagnosis not present

## 2017-07-29 DIAGNOSIS — T8619 Other complication of kidney transplant: Secondary | ICD-10-CM | POA: Diagnosis not present

## 2017-07-29 DIAGNOSIS — D8989 Other specified disorders involving the immune mechanism, not elsewhere classified: Secondary | ICD-10-CM | POA: Diagnosis not present

## 2017-07-29 DIAGNOSIS — B258 Other cytomegaloviral diseases: Secondary | ICD-10-CM | POA: Diagnosis not present

## 2017-07-29 DIAGNOSIS — Z79899 Other long term (current) drug therapy: Secondary | ICD-10-CM | POA: Diagnosis not present

## 2017-07-29 DIAGNOSIS — Z7952 Long term (current) use of systemic steroids: Secondary | ICD-10-CM | POA: Diagnosis not present

## 2017-07-29 DIAGNOSIS — I10 Essential (primary) hypertension: Secondary | ICD-10-CM | POA: Diagnosis not present

## 2017-08-12 DIAGNOSIS — Z7952 Long term (current) use of systemic steroids: Secondary | ICD-10-CM | POA: Diagnosis not present

## 2017-08-12 DIAGNOSIS — N051 Unspecified nephritic syndrome with focal and segmental glomerular lesions: Secondary | ICD-10-CM | POA: Diagnosis not present

## 2017-08-12 DIAGNOSIS — B349 Viral infection, unspecified: Secondary | ICD-10-CM | POA: Diagnosis not present

## 2017-08-12 DIAGNOSIS — Z792 Long term (current) use of antibiotics: Secondary | ICD-10-CM | POA: Diagnosis not present

## 2017-08-12 DIAGNOSIS — B259 Cytomegaloviral disease, unspecified: Secondary | ICD-10-CM | POA: Diagnosis not present

## 2017-08-12 DIAGNOSIS — D649 Anemia, unspecified: Secondary | ICD-10-CM | POA: Diagnosis not present

## 2017-08-12 DIAGNOSIS — Z87440 Personal history of urinary (tract) infections: Secondary | ICD-10-CM | POA: Diagnosis not present

## 2017-08-12 DIAGNOSIS — Z4822 Encounter for aftercare following kidney transplant: Secondary | ICD-10-CM | POA: Diagnosis not present

## 2017-08-12 DIAGNOSIS — Z5181 Encounter for therapeutic drug level monitoring: Secondary | ICD-10-CM | POA: Diagnosis not present

## 2017-08-12 DIAGNOSIS — D899 Disorder involving the immune mechanism, unspecified: Secondary | ICD-10-CM | POA: Diagnosis not present

## 2017-08-12 DIAGNOSIS — N390 Urinary tract infection, site not specified: Secondary | ICD-10-CM | POA: Diagnosis not present

## 2017-08-12 DIAGNOSIS — I10 Essential (primary) hypertension: Secondary | ICD-10-CM | POA: Diagnosis not present

## 2017-08-12 DIAGNOSIS — Z79899 Other long term (current) drug therapy: Secondary | ICD-10-CM | POA: Diagnosis not present

## 2017-08-12 DIAGNOSIS — Z3009 Encounter for other general counseling and advice on contraception: Secondary | ICD-10-CM | POA: Diagnosis not present

## 2017-08-12 DIAGNOSIS — Z940 Kidney transplant status: Secondary | ICD-10-CM | POA: Diagnosis not present

## 2017-08-19 ENCOUNTER — Emergency Department (HOSPITAL_COMMUNITY): Payer: No Typology Code available for payment source

## 2017-08-19 ENCOUNTER — Emergency Department (HOSPITAL_COMMUNITY)
Admission: EM | Admit: 2017-08-19 | Discharge: 2017-08-19 | Disposition: A | Discharge status: Home / Self Care | Payer: No Typology Code available for payment source | Attending: Emergency Medicine | Admitting: Emergency Medicine

## 2017-08-19 ENCOUNTER — Encounter (HOSPITAL_COMMUNITY): Payer: Self-pay

## 2017-08-19 DIAGNOSIS — Y999 Unspecified external cause status: Secondary | ICD-10-CM | POA: Diagnosis not present

## 2017-08-19 DIAGNOSIS — Z79899 Other long term (current) drug therapy: Secondary | ICD-10-CM | POA: Diagnosis not present

## 2017-08-19 DIAGNOSIS — Z7982 Long term (current) use of aspirin: Secondary | ICD-10-CM | POA: Diagnosis not present

## 2017-08-19 DIAGNOSIS — S161XXA Strain of muscle, fascia and tendon at neck level, initial encounter: Secondary | ICD-10-CM | POA: Diagnosis not present

## 2017-08-19 DIAGNOSIS — Y9389 Activity, other specified: Secondary | ICD-10-CM | POA: Insufficient documentation

## 2017-08-19 DIAGNOSIS — N186 End stage renal disease: Secondary | ICD-10-CM | POA: Insufficient documentation

## 2017-08-19 DIAGNOSIS — Z87891 Personal history of nicotine dependence: Secondary | ICD-10-CM | POA: Diagnosis not present

## 2017-08-19 DIAGNOSIS — Y92410 Unspecified street and highway as the place of occurrence of the external cause: Secondary | ICD-10-CM | POA: Diagnosis not present

## 2017-08-19 DIAGNOSIS — Z992 Dependence on renal dialysis: Secondary | ICD-10-CM | POA: Insufficient documentation

## 2017-08-19 DIAGNOSIS — I120 Hypertensive chronic kidney disease with stage 5 chronic kidney disease or end stage renal disease: Secondary | ICD-10-CM | POA: Insufficient documentation

## 2017-08-19 DIAGNOSIS — S199XXA Unspecified injury of neck, initial encounter: Secondary | ICD-10-CM | POA: Diagnosis present

## 2017-08-19 DIAGNOSIS — S39012A Strain of muscle, fascia and tendon of lower back, initial encounter: Secondary | ICD-10-CM | POA: Diagnosis not present

## 2017-08-19 DIAGNOSIS — M546 Pain in thoracic spine: Secondary | ICD-10-CM | POA: Diagnosis not present

## 2017-08-19 MED ORDER — ORPHENADRINE CITRATE ER 100 MG PO TB12: 100.0000 mg | ORAL_TABLET | Freq: Two times a day (BID) | ORAL | 0 refills | Status: AC

## 2017-08-19 MED ORDER — ACETAMINOPHEN 325 MG PO TABS
650.0000 mg | ORAL_TABLET | Freq: Once | ORAL | Status: DC
  Filled 2017-08-19: qty 2
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
## 2017-08-19 NOTE — Discharge Instructions (Addendum)
Warm compresses to sore muscles for 20 minutes at a time. You may take Tylenol as directed as needed for pain. You have a prescription for Norflex which is a muscle relaxer.  Contact your transplant team tomorrow to ask if this may be taken.

## 2017-08-19 NOTE — ED Notes (Signed)
Declined W/C at D/C and was escorted to lobby by RN. 

## 2017-09-05 DIAGNOSIS — D649 Anemia, unspecified: Secondary | ICD-10-CM | POA: Diagnosis not present

## 2017-09-05 DIAGNOSIS — N390 Urinary tract infection, site not specified: Secondary | ICD-10-CM | POA: Diagnosis not present

## 2017-09-05 DIAGNOSIS — B9789 Other viral agents as the cause of diseases classified elsewhere: Secondary | ICD-10-CM | POA: Diagnosis not present

## 2017-09-05 DIAGNOSIS — Z940 Kidney transplant status: Secondary | ICD-10-CM | POA: Diagnosis not present

## 2017-09-05 DIAGNOSIS — E871 Hypo-osmolality and hyponatremia: Secondary | ICD-10-CM | POA: Diagnosis not present

## 2017-09-05 DIAGNOSIS — D72819 Decreased white blood cell count, unspecified: Secondary | ICD-10-CM | POA: Diagnosis not present

## 2017-09-05 DIAGNOSIS — D8989 Other specified disorders involving the immune mechanism, not elsewhere classified: Secondary | ICD-10-CM | POA: Diagnosis not present

## 2017-09-05 DIAGNOSIS — Z7952 Long term (current) use of systemic steroids: Secondary | ICD-10-CM | POA: Diagnosis not present

## 2017-09-05 DIAGNOSIS — I10 Essential (primary) hypertension: Secondary | ICD-10-CM | POA: Diagnosis not present

## 2017-09-05 DIAGNOSIS — Z4822 Encounter for aftercare following kidney transplant: Secondary | ICD-10-CM | POA: Diagnosis not present

## 2017-09-05 DIAGNOSIS — B259 Cytomegaloviral disease, unspecified: Secondary | ICD-10-CM | POA: Diagnosis not present

## 2017-09-05 DIAGNOSIS — Z131 Encounter for screening for diabetes mellitus: Secondary | ICD-10-CM | POA: Diagnosis not present

## 2017-09-05 DIAGNOSIS — Z79899 Other long term (current) drug therapy: Secondary | ICD-10-CM | POA: Diagnosis not present

## 2017-09-19 DIAGNOSIS — Z940 Kidney transplant status: Secondary | ICD-10-CM | POA: Diagnosis not present

## 2017-09-19 DIAGNOSIS — I10 Essential (primary) hypertension: Secondary | ICD-10-CM | POA: Diagnosis not present

## 2017-09-19 DIAGNOSIS — E785 Hyperlipidemia, unspecified: Secondary | ICD-10-CM | POA: Diagnosis not present

## 2017-09-19 DIAGNOSIS — B349 Viral infection, unspecified: Secondary | ICD-10-CM | POA: Diagnosis not present

## 2017-09-19 DIAGNOSIS — Z79899 Other long term (current) drug therapy: Secondary | ICD-10-CM | POA: Diagnosis not present

## 2017-09-19 DIAGNOSIS — Z5181 Encounter for therapeutic drug level monitoring: Secondary | ICD-10-CM | POA: Diagnosis not present

## 2017-09-19 DIAGNOSIS — Z7983 Long term (current) use of bisphosphonates: Secondary | ICD-10-CM | POA: Diagnosis not present

## 2017-09-19 DIAGNOSIS — D72819 Decreased white blood cell count, unspecified: Secondary | ICD-10-CM | POA: Diagnosis not present

## 2017-09-19 DIAGNOSIS — B9789 Other viral agents as the cause of diseases classified elsewhere: Secondary | ICD-10-CM | POA: Diagnosis not present

## 2017-09-19 DIAGNOSIS — Z4822 Encounter for aftercare following kidney transplant: Secondary | ICD-10-CM | POA: Diagnosis not present

## 2017-09-19 DIAGNOSIS — Z87440 Personal history of urinary (tract) infections: Secondary | ICD-10-CM | POA: Diagnosis not present

## 2017-09-19 DIAGNOSIS — D8989 Other specified disorders involving the immune mechanism, not elsewhere classified: Secondary | ICD-10-CM | POA: Diagnosis not present

## 2017-09-19 DIAGNOSIS — D649 Anemia, unspecified: Secondary | ICD-10-CM | POA: Diagnosis not present

## 2017-09-19 DIAGNOSIS — Z792 Long term (current) use of antibiotics: Secondary | ICD-10-CM | POA: Diagnosis not present

## 2017-09-19 DIAGNOSIS — Z7952 Long term (current) use of systemic steroids: Secondary | ICD-10-CM | POA: Diagnosis not present

## 2017-09-19 DIAGNOSIS — B259 Cytomegaloviral disease, unspecified: Secondary | ICD-10-CM | POA: Diagnosis not present

## 2017-09-19 DIAGNOSIS — E871 Hypo-osmolality and hyponatremia: Secondary | ICD-10-CM | POA: Diagnosis not present

## 2017-09-26 DIAGNOSIS — D631 Anemia in chronic kidney disease: Secondary | ICD-10-CM | POA: Diagnosis not present

## 2017-09-26 DIAGNOSIS — Z940 Kidney transplant status: Secondary | ICD-10-CM | POA: Diagnosis not present

## 2017-09-26 DIAGNOSIS — Z7952 Long term (current) use of systemic steroids: Secondary | ICD-10-CM | POA: Diagnosis not present

## 2017-09-26 DIAGNOSIS — N200 Calculus of kidney: Secondary | ICD-10-CM | POA: Diagnosis not present

## 2017-09-26 DIAGNOSIS — N390 Urinary tract infection, site not specified: Secondary | ICD-10-CM | POA: Diagnosis not present

## 2017-09-26 DIAGNOSIS — N186 End stage renal disease: Secondary | ICD-10-CM | POA: Diagnosis not present

## 2017-09-26 DIAGNOSIS — Z4822 Encounter for aftercare following kidney transplant: Secondary | ICD-10-CM | POA: Diagnosis not present

## 2017-09-26 DIAGNOSIS — Z79899 Other long term (current) drug therapy: Secondary | ICD-10-CM | POA: Diagnosis not present

## 2017-09-26 DIAGNOSIS — T8611 Kidney transplant rejection: Secondary | ICD-10-CM | POA: Diagnosis not present

## 2017-09-26 DIAGNOSIS — R945 Abnormal results of liver function studies: Secondary | ICD-10-CM | POA: Diagnosis not present

## 2017-09-26 DIAGNOSIS — I120 Hypertensive chronic kidney disease with stage 5 chronic kidney disease or end stage renal disease: Secondary | ICD-10-CM | POA: Diagnosis not present

## 2017-09-26 DIAGNOSIS — N041 Nephrotic syndrome with focal and segmental glomerular lesions: Secondary | ICD-10-CM | POA: Diagnosis not present

## 2017-10-01 IMAGING — XA IR REMOVAL TUNNELED CV CATH
1 series · 1 of 1 positions shown · non-contrast
Comparison: none

INDICATION: Peritoneal dialysis

[Series 1: fl (-) angio · 1 of 1 slices shown]
[im 1/1]
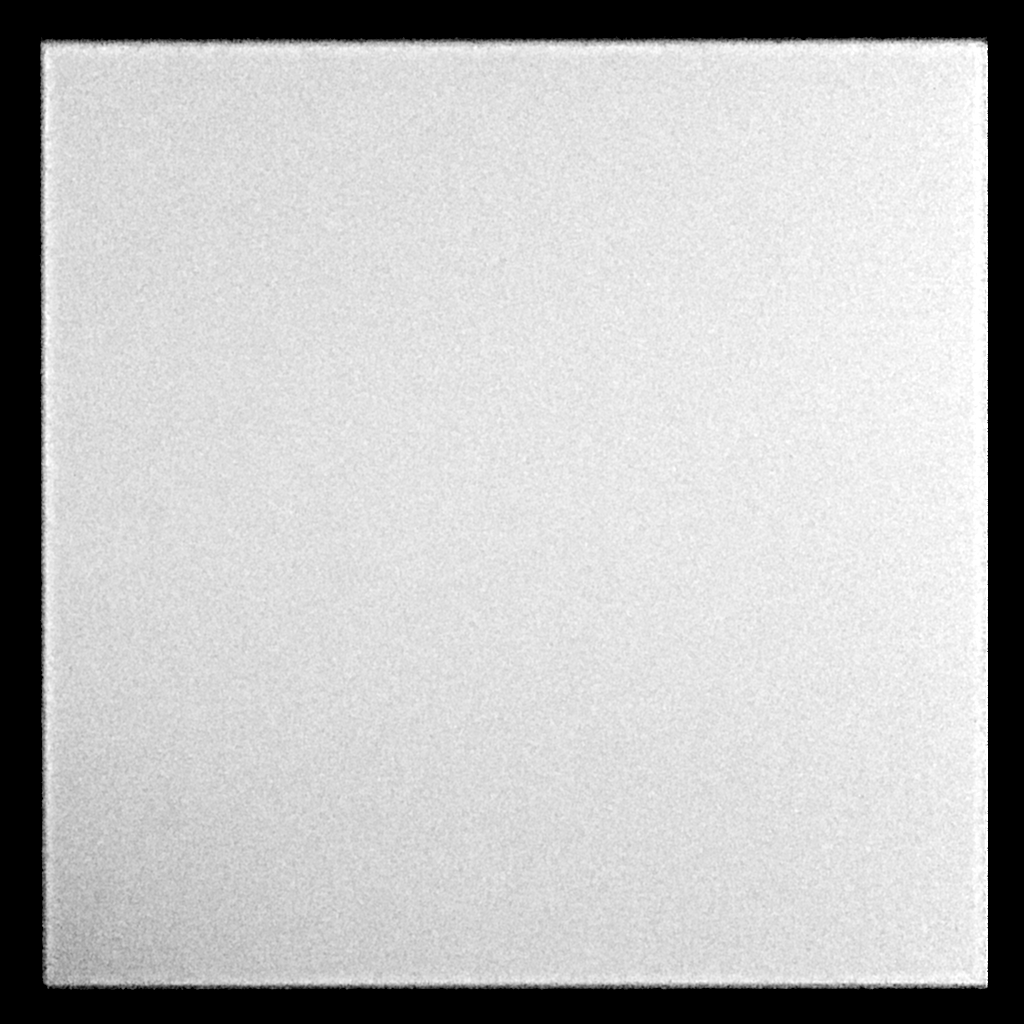

[1 of 1 positions shown; findings below may reference images not displayed]

EXAM:
REMOVAL TUNNELED CENTRAL VENOUS CATHETER

MEDICATIONS:
; The antibiotic was administered within an appropriate time
interval prior to skin puncture.

ANESTHESIA/SEDATION:
Versed  mg IV; Fentanyl  mcg IV;

Moderate Sedation Time:

The patient was continuously monitored during the procedure by the
interventional radiology nurse under my direct supervision.

FLUOROSCOPY TIME:  Fluoroscopy Time:  minutes  seconds ( mGy).

COMPLICATIONS:
None immediate.

PROCEDURE:
Informed written consent was obtained from the patient after a
thorough discussion of the procedural risks, benefits and
alternatives. All questions were addressed. Maximal Sterile Barrier
Technique was utilized including caps, mask, sterile gowns, sterile
gloves, sterile drape, hand hygiene and skin antiseptic. A timeout
was performed prior to the initiation of the procedure.

The right chest was prepped with ChloraPrep in a sterile fashion,
and a sterile drape was applied covering the operative field. A
sterile gown and sterile gloves were used for the procedure.

1% lidocaine was utilized for local anesthesia. Utilizing blunt
dissection, the cuff of the catheter was freed from the underlying
subcutaneous tissue. The catheter was removed in its entirety.
Hemostasis was achieved with direct pressure.
IMPRESSION: Successful tunneled dialysis catheter removal.

## 2017-10-02 DIAGNOSIS — Z79899 Other long term (current) drug therapy: Secondary | ICD-10-CM | POA: Diagnosis not present

## 2017-10-02 DIAGNOSIS — Z5181 Encounter for therapeutic drug level monitoring: Secondary | ICD-10-CM | POA: Diagnosis not present

## 2017-10-02 DIAGNOSIS — Z792 Long term (current) use of antibiotics: Secondary | ICD-10-CM | POA: Diagnosis not present

## 2017-10-02 DIAGNOSIS — Z4822 Encounter for aftercare following kidney transplant: Secondary | ICD-10-CM | POA: Diagnosis not present

## 2017-10-02 DIAGNOSIS — Z7952 Long term (current) use of systemic steroids: Secondary | ICD-10-CM | POA: Diagnosis not present

## 2017-10-08 DIAGNOSIS — Z5320 Procedure and treatment not carried out because of patient's decision for unspecified reasons: Secondary | ICD-10-CM | POA: Diagnosis not present

## 2017-10-08 DIAGNOSIS — Z3202 Encounter for pregnancy test, result negative: Secondary | ICD-10-CM | POA: Diagnosis not present

## 2017-10-08 DIAGNOSIS — N186 End stage renal disease: Secondary | ICD-10-CM | POA: Diagnosis not present

## 2017-10-08 DIAGNOSIS — Z940 Kidney transplant status: Secondary | ICD-10-CM | POA: Diagnosis not present

## 2017-10-08 DIAGNOSIS — R1031 Right lower quadrant pain: Secondary | ICD-10-CM | POA: Diagnosis not present

## 2017-10-13 DIAGNOSIS — N390 Urinary tract infection, site not specified: Secondary | ICD-10-CM | POA: Diagnosis not present

## 2017-10-13 DIAGNOSIS — Z940 Kidney transplant status: Secondary | ICD-10-CM | POA: Diagnosis not present

## 2017-10-16 DIAGNOSIS — D899 Disorder involving the immune mechanism, unspecified: Secondary | ICD-10-CM | POA: Diagnosis not present

## 2017-10-16 DIAGNOSIS — Z4822 Encounter for aftercare following kidney transplant: Secondary | ICD-10-CM | POA: Diagnosis not present

## 2017-10-16 DIAGNOSIS — D649 Anemia, unspecified: Secondary | ICD-10-CM | POA: Diagnosis not present

## 2017-10-16 DIAGNOSIS — I10 Essential (primary) hypertension: Secondary | ICD-10-CM | POA: Diagnosis not present

## 2017-10-16 DIAGNOSIS — Z940 Kidney transplant status: Secondary | ICD-10-CM | POA: Diagnosis not present

## 2017-10-16 DIAGNOSIS — Z792 Long term (current) use of antibiotics: Secondary | ICD-10-CM | POA: Diagnosis not present

## 2017-10-16 DIAGNOSIS — Z5181 Encounter for therapeutic drug level monitoring: Secondary | ICD-10-CM | POA: Diagnosis not present

## 2017-10-16 DIAGNOSIS — Z79899 Other long term (current) drug therapy: Secondary | ICD-10-CM | POA: Diagnosis not present

## 2017-10-16 DIAGNOSIS — B349 Viral infection, unspecified: Secondary | ICD-10-CM | POA: Diagnosis not present

## 2017-10-16 DIAGNOSIS — Z7952 Long term (current) use of systemic steroids: Secondary | ICD-10-CM | POA: Diagnosis not present

## 2017-10-16 DIAGNOSIS — R894 Abnormal immunological findings in specimens from other organs, systems and tissues: Secondary | ICD-10-CM | POA: Diagnosis not present

## 2017-10-16 DIAGNOSIS — Z87440 Personal history of urinary (tract) infections: Secondary | ICD-10-CM | POA: Diagnosis not present

## 2017-10-16 DIAGNOSIS — N200 Calculus of kidney: Secondary | ICD-10-CM | POA: Diagnosis not present

## 2017-10-16 DIAGNOSIS — N390 Urinary tract infection, site not specified: Secondary | ICD-10-CM | POA: Diagnosis not present

## 2017-10-22 DIAGNOSIS — Z9889 Other specified postprocedural states: Secondary | ICD-10-CM | POA: Diagnosis not present

## 2017-10-22 DIAGNOSIS — Z940 Kidney transplant status: Secondary | ICD-10-CM | POA: Diagnosis not present

## 2017-10-22 DIAGNOSIS — N1370 Vesicoureteral-reflux, unspecified: Secondary | ICD-10-CM | POA: Diagnosis not present

## 2017-11-06 ENCOUNTER — Emergency Department (HOSPITAL_COMMUNITY): Payer: No Typology Code available for payment source

## 2017-11-06 ENCOUNTER — Other Ambulatory Visit: Payer: Self-pay

## 2017-11-06 ENCOUNTER — Emergency Department (HOSPITAL_COMMUNITY)
Admission: EM | Admit: 2017-11-06 | Discharge: 2017-11-06 | Disposition: A | Discharge status: Home / Self Care | Payer: No Typology Code available for payment source | Attending: Emergency Medicine | Admitting: Emergency Medicine

## 2017-11-06 DIAGNOSIS — Y939 Activity, unspecified: Secondary | ICD-10-CM | POA: Diagnosis not present

## 2017-11-06 DIAGNOSIS — Z7982 Long term (current) use of aspirin: Secondary | ICD-10-CM | POA: Insufficient documentation

## 2017-11-06 DIAGNOSIS — Z87891 Personal history of nicotine dependence: Secondary | ICD-10-CM | POA: Insufficient documentation

## 2017-11-06 DIAGNOSIS — G4489 Other headache syndrome: Secondary | ICD-10-CM | POA: Diagnosis not present

## 2017-11-06 DIAGNOSIS — S0990XA Unspecified injury of head, initial encounter: Secondary | ICD-10-CM | POA: Diagnosis not present

## 2017-11-06 DIAGNOSIS — Z79899 Other long term (current) drug therapy: Secondary | ICD-10-CM | POA: Insufficient documentation

## 2017-11-06 DIAGNOSIS — Y998 Other external cause status: Secondary | ICD-10-CM | POA: Insufficient documentation

## 2017-11-06 DIAGNOSIS — S161XXA Strain of muscle, fascia and tendon at neck level, initial encounter: Secondary | ICD-10-CM | POA: Insufficient documentation

## 2017-11-06 DIAGNOSIS — I10 Essential (primary) hypertension: Secondary | ICD-10-CM | POA: Diagnosis not present

## 2017-11-06 DIAGNOSIS — Y92410 Unspecified street and highway as the place of occurrence of the external cause: Secondary | ICD-10-CM | POA: Diagnosis not present

## 2017-11-06 DIAGNOSIS — R51 Headache: Secondary | ICD-10-CM | POA: Insufficient documentation

## 2017-11-06 DIAGNOSIS — S199XXA Unspecified injury of neck, initial encounter: Secondary | ICD-10-CM | POA: Diagnosis present

## 2017-11-06 DIAGNOSIS — R519 Headache, unspecified: Secondary | ICD-10-CM

## 2017-11-06 DIAGNOSIS — M542 Cervicalgia: Secondary | ICD-10-CM | POA: Diagnosis not present

## 2017-11-06 DIAGNOSIS — R52 Pain, unspecified: Secondary | ICD-10-CM | POA: Diagnosis not present

## 2017-11-06 MED ORDER — CYCLOBENZAPRINE HCL 10 MG PO TABS
10.0000 mg | ORAL_TABLET | Freq: Once | ORAL | Status: AC
  Administered 2017-11-06: 10 mg via ORAL
  Filled 2017-11-06: qty 1

## 2017-11-06 MED ORDER — CYCLOBENZAPRINE HCL 10 MG PO TABS: 10.0000 mg | ORAL_TABLET | Freq: Two times a day (BID) | ORAL | 0 refills | Status: DC | PRN

## 2017-11-06 MED ORDER — CYCLOBENZAPRINE HCL 10 MG PO TABS: 10.0000 mg | ORAL_TABLET | Freq: Two times a day (BID) | ORAL | 0 refills | Status: AC | PRN

## 2017-11-06 MED ORDER — ACETAMINOPHEN 325 MG PO TABS
650.0000 mg | ORAL_TABLET | Freq: Once | ORAL | Status: AC
  Administered 2017-11-06: 650 mg via ORAL
  Filled 2017-11-06: qty 2
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
## 2017-11-06 NOTE — Discharge Instructions (Addendum)
Follow up with your primary care provider in the next week. Return to the ER for worsening or concerning symptoms.  Take Tylenol as needed as directed for pain. Take Flexeril as needed as prescribed for pain. Warm compresses to sore muscles for 20 minutes at a time.
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
## 2017-11-07 DIAGNOSIS — Z940 Kidney transplant status: Secondary | ICD-10-CM | POA: Diagnosis not present

## 2017-11-07 DIAGNOSIS — R51 Headache: Secondary | ICD-10-CM | POA: Diagnosis not present

## 2017-11-07 DIAGNOSIS — Z131 Encounter for screening for diabetes mellitus: Secondary | ICD-10-CM | POA: Diagnosis not present

## 2017-11-07 DIAGNOSIS — Z5181 Encounter for therapeutic drug level monitoring: Secondary | ICD-10-CM | POA: Diagnosis not present

## 2017-11-07 DIAGNOSIS — Z79899 Other long term (current) drug therapy: Secondary | ICD-10-CM | POA: Diagnosis not present

## 2017-11-07 DIAGNOSIS — Z4822 Encounter for aftercare following kidney transplant: Secondary | ICD-10-CM | POA: Diagnosis not present

## 2017-11-07 DIAGNOSIS — B258 Other cytomegaloviral diseases: Secondary | ICD-10-CM | POA: Diagnosis not present

## 2017-11-07 DIAGNOSIS — N200 Calculus of kidney: Secondary | ICD-10-CM | POA: Diagnosis not present

## 2017-11-07 DIAGNOSIS — E785 Hyperlipidemia, unspecified: Secondary | ICD-10-CM | POA: Diagnosis not present

## 2017-11-07 DIAGNOSIS — Z7952 Long term (current) use of systemic steroids: Secondary | ICD-10-CM | POA: Diagnosis not present

## 2017-11-07 DIAGNOSIS — M25512 Pain in left shoulder: Secondary | ICD-10-CM | POA: Diagnosis not present

## 2017-11-07 DIAGNOSIS — E872 Acidosis: Secondary | ICD-10-CM | POA: Diagnosis not present

## 2017-11-07 DIAGNOSIS — I10 Essential (primary) hypertension: Secondary | ICD-10-CM | POA: Diagnosis not present

## 2017-11-07 DIAGNOSIS — D8989 Other specified disorders involving the immune mechanism, not elsewhere classified: Secondary | ICD-10-CM | POA: Diagnosis not present

## 2017-11-07 DIAGNOSIS — N390 Urinary tract infection, site not specified: Secondary | ICD-10-CM | POA: Diagnosis not present

## 2017-11-07 DIAGNOSIS — T1490XD Injury, unspecified, subsequent encounter: Secondary | ICD-10-CM | POA: Diagnosis not present

## 2017-11-07 DIAGNOSIS — Z792 Long term (current) use of antibiotics: Secondary | ICD-10-CM | POA: Diagnosis not present

## 2017-11-07 DIAGNOSIS — T8619 Other complication of kidney transplant: Secondary | ICD-10-CM | POA: Diagnosis not present

## 2017-11-07 DIAGNOSIS — D72819 Decreased white blood cell count, unspecified: Secondary | ICD-10-CM | POA: Diagnosis not present

## 2017-11-07 DIAGNOSIS — B9789 Other viral agents as the cause of diseases classified elsewhere: Secondary | ICD-10-CM | POA: Diagnosis not present

## 2017-11-12 ENCOUNTER — Other Ambulatory Visit: Payer: Self-pay | Admitting: Chiropractor

## 2017-11-12 DIAGNOSIS — M25512 Pain in left shoulder: Secondary | ICD-10-CM

## 2017-11-17 DIAGNOSIS — D899 Disorder involving the immune mechanism, unspecified: Secondary | ICD-10-CM | POA: Diagnosis not present

## 2017-11-17 DIAGNOSIS — N390 Urinary tract infection, site not specified: Secondary | ICD-10-CM | POA: Diagnosis not present

## 2017-11-17 DIAGNOSIS — Z940 Kidney transplant status: Secondary | ICD-10-CM | POA: Diagnosis not present

## 2017-11-21 DIAGNOSIS — B258 Other cytomegaloviral diseases: Secondary | ICD-10-CM | POA: Diagnosis not present

## 2017-11-21 DIAGNOSIS — N189 Chronic kidney disease, unspecified: Secondary | ICD-10-CM | POA: Diagnosis not present

## 2017-11-21 DIAGNOSIS — D631 Anemia in chronic kidney disease: Secondary | ICD-10-CM | POA: Diagnosis not present

## 2017-11-21 DIAGNOSIS — Z5181 Encounter for therapeutic drug level monitoring: Secondary | ICD-10-CM | POA: Diagnosis not present

## 2017-11-21 DIAGNOSIS — Z79899 Other long term (current) drug therapy: Secondary | ICD-10-CM | POA: Diagnosis not present

## 2017-11-21 DIAGNOSIS — Z87440 Personal history of urinary (tract) infections: Secondary | ICD-10-CM | POA: Diagnosis not present

## 2017-11-21 DIAGNOSIS — Z4822 Encounter for aftercare following kidney transplant: Secondary | ICD-10-CM | POA: Diagnosis not present

## 2017-11-21 DIAGNOSIS — Z792 Long term (current) use of antibiotics: Secondary | ICD-10-CM | POA: Diagnosis not present

## 2017-11-21 DIAGNOSIS — D8989 Other specified disorders involving the immune mechanism, not elsewhere classified: Secondary | ICD-10-CM | POA: Diagnosis not present

## 2017-11-21 DIAGNOSIS — B349 Viral infection, unspecified: Secondary | ICD-10-CM | POA: Diagnosis not present

## 2017-11-21 DIAGNOSIS — Z7952 Long term (current) use of systemic steroids: Secondary | ICD-10-CM | POA: Diagnosis not present

## 2017-11-21 DIAGNOSIS — I10 Essential (primary) hypertension: Secondary | ICD-10-CM | POA: Diagnosis not present

## 2017-11-21 DIAGNOSIS — Z940 Kidney transplant status: Secondary | ICD-10-CM | POA: Diagnosis not present

## 2017-11-22 ENCOUNTER — Other Ambulatory Visit: Payer: Medicare Other

## 2017-11-26 ENCOUNTER — Ambulatory Visit
Admission: RE | Admit: 2017-11-26 | Discharge: 2017-11-26 | Disposition: A | Discharge status: Home / Self Care | Payer: PRIVATE HEALTH INSURANCE | Source: Ambulatory Visit | Attending: Chiropractor | Admitting: Chiropractor

## 2017-11-26 DIAGNOSIS — M25512 Pain in left shoulder: Secondary | ICD-10-CM | POA: Diagnosis not present

## 2017-11-26 DIAGNOSIS — S4992XA Unspecified injury of left shoulder and upper arm, initial encounter: Secondary | ICD-10-CM | POA: Diagnosis not present

## 2017-12-05 DIAGNOSIS — Z940 Kidney transplant status: Secondary | ICD-10-CM | POA: Diagnosis not present

## 2017-12-05 DIAGNOSIS — Z4822 Encounter for aftercare following kidney transplant: Secondary | ICD-10-CM | POA: Diagnosis not present

## 2017-12-05 DIAGNOSIS — Z79899 Other long term (current) drug therapy: Secondary | ICD-10-CM | POA: Diagnosis not present

## 2018-01-20 ENCOUNTER — Other Ambulatory Visit: Payer: Self-pay | Admitting: Obstetrics & Gynecology

## 2018-01-20 DIAGNOSIS — N644 Mastodynia: Secondary | ICD-10-CM

## 2018-01-23 ENCOUNTER — Ambulatory Visit: Payer: Medicare Other

## 2018-01-23 ENCOUNTER — Ambulatory Visit
Admission: RE | Admit: 2018-01-23 | Discharge: 2018-01-23 | Disposition: A | Discharge status: Home / Self Care | Payer: Medicare Other | Source: Ambulatory Visit | Attending: Obstetrics & Gynecology | Admitting: Obstetrics & Gynecology

## 2018-01-23 DIAGNOSIS — N644 Mastodynia: Secondary | ICD-10-CM

## 2018-07-26 ENCOUNTER — Other Ambulatory Visit: Payer: Self-pay

## 2018-07-26 ENCOUNTER — Ambulatory Visit (INDEPENDENT_AMBULATORY_CARE_PROVIDER_SITE_OTHER): Payer: Medicare Other

## 2018-07-26 ENCOUNTER — Ambulatory Visit (HOSPITAL_COMMUNITY)
Admission: EM | Admit: 2018-07-26 | Discharge: 2018-07-26 | Disposition: A | Discharge status: Home / Self Care | Payer: Medicare Other | Attending: Emergency Medicine | Admitting: Emergency Medicine

## 2018-07-26 ENCOUNTER — Encounter (HOSPITAL_COMMUNITY): Payer: Self-pay

## 2018-07-26 DIAGNOSIS — S161XXA Strain of muscle, fascia and tendon at neck level, initial encounter: Secondary | ICD-10-CM

## 2018-07-26 DIAGNOSIS — M25532 Pain in left wrist: Secondary | ICD-10-CM

## 2018-07-26 MED ORDER — CYCLOBENZAPRINE HCL 5 MG PO TABS: 5.0000 mg | ORAL_TABLET | Freq: Two times a day (BID) | ORAL | 0 refills | Status: DC | PRN
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
## 2018-07-26 NOTE — Discharge Instructions (Signed)
Tylenol [phone] mg every 4-6 hours for pain You may use flexeril as needed to help with pain. This is a muscle relaxer and causes sedation- please use only at bedtime or when you will be home and not have to drive/work  Ice wrist Gentle neck stretching- see attached  Follow up if not improving or worsening

## 2018-07-29 ENCOUNTER — Encounter: Payer: Self-pay | Admitting: Family

## 2018-07-29 ENCOUNTER — Other Ambulatory Visit: Payer: Self-pay

## 2018-07-29 ENCOUNTER — Ambulatory Visit (INDEPENDENT_AMBULATORY_CARE_PROVIDER_SITE_OTHER): Payer: Medicare Other | Admitting: Family

## 2018-07-29 VITALS — BP 110/78 | HR 60 | Temp 97.6°F | Resp 12 | Ht 67.0 in | Wt 174.7 lb

## 2018-07-29 DIAGNOSIS — Z940 Kidney transplant status: Secondary | ICD-10-CM

## 2018-07-29 DIAGNOSIS — I770 Arteriovenous fistula, acquired: Secondary | ICD-10-CM | POA: Diagnosis not present

## 2018-07-29 NOTE — Progress Notes (Signed)
Referred by:  Lavonia Dana, MD [address] [place],   [id number]  Reason for referral: Aneurysmal AVF  History of Present Illness  Meghan Richardson is a 38 y.o. (07/14/1980) female who states that she was referred here by her nephrologist to have her left arm AVF removed. She states it had never been used, her AVF is not painful. She denies any steal type symptoms in her left upper extremity.  She had a kidney transplant from a cadaver in February 2019, followed by [facility]. She states that her transplanted kidney is working well.    Past Medical History:  Diagnosis Date  . FSGS (focal segmental glomerulosclerosis)    Reports Kidney Bx at age 4  . Hypertension     Past Surgical History:  Procedure Laterality Date  . AV FISTULA PLACEMENT      Social History   Socioeconomic History  . Marital status: Single    Spouse name: Not on file  . Number of children: Not on file  . Years of education: Not on file  . Highest education level: Not on file  Occupational History  . Not on file  Social Needs  . Financial resource strain: Not on file  . Food insecurity    Worry: Not on file    Inability: Not on file  . Transportation needs    Medical: Not on file    Non-medical: Not on file  Tobacco Use  . Smoking status: Former Research scientist (life sciences)  . Smokeless tobacco: Never Used  Substance and Sexual Activity  . Alcohol use: No    Alcohol/week: 0.0 standard drinks  . Drug use: No  . Sexual activity: Yes    Partners: Male  Lifestyle  . Physical activity    Days per week: Not on file    Minutes per session: Not on file  . Stress: Not on file  Relationships  . Social Herbalist on phone: Not on file    Gets together: Not on file    Attends religious service: Not on file    Active member of club or organization: Not on file    Attends meetings of clubs or organizations: Not on file    Relationship status: Not on file  .  Intimate partner violence    Fear of current or ex partner: Not on file    Emotionally abused: Not on file    Physically abused: Not on file    Forced sexual activity: Not on file  Other Topics Concern  . Not on file  Social History Narrative   Lives at home with 46 year old daughter    Family History  Problem Relation Age of Onset  . Kidney disease Brother        FSGS  . Breast cancer Maternal Aunt   . Breast cancer Maternal Grandmother     Current Outpatient Medications on File Prior to Visit  Medication Sig Dispense Refill  . aspirin EC 81 MG tablet Take 81 mg by mouth daily.    . cefpodoxime (VANTIN) 200 MG tablet Take 1 tablet (200 mg total) by mouth 2 (two) times daily. 20 tablet 0  . Cinacalcet HCl (SENSIPAR PO) Take by mouth.    . cyclobenzaprine (FLEXERIL) 5 MG tablet Take 1-2 tablets (5-10 mg total) by mouth 2 (two) times daily as needed for muscle spasms. 24 tablet 0  . Labetalol HCl (NORMODYNE PO) Take by mouth.    Marland Kitchen  lisinopril (PRINIVIL,ZESTRIL) 10 MG tablet Take 10 mg by mouth 2 (two) times daily.    . Mycophenolate Sodium (MYFORTIC PO) Take by mouth.    . predniSONE (DELTASONE PO) Take by mouth.    . Sulfamethoxazole-Trimethoprim (SEPTRA PO) Take by mouth.    . Tacrolimus (PROGRAF PO) Take by mouth.     No current facility-administered medications on file prior to visit.     No Known Allergies   REVIEW OF SYSTEMS: Cardiovascular: No chest pain, chest pressure, palpitations, orthopnea, or dyspnea on exertion. No claudication or rest pain,  No history of DVT or phlebitis. Pulmonary: No productive cough, asthma or wheezing. Neurologic: No weakness, paresthesias, aphasia, or amaurosis. No dizziness. Hematologic: No bleeding problems or clotting disorders. Musculoskeletal: No joint pain or joint swelling. Gastrointestinal: No blood in stool or hematemesis Genitourinary: No dysuria or hematuria. Psychiatric:: No history of major depression. Integumentary: No  rashes or ulcers. Constitutional: No fever or chills  For VQI Use Only  PRE-ADM LIVING: Home  AMB STATUS: Ambulatory  CAD Sx: None  PRIOR CHF: None  STRESS TEST: pt denies any cardiac problems     Physical Examination  Vitals:   07/29/18 1231  BP: 110/78  Pulse: 60  Resp: 12  Temp: 97.6 F (36.4 C)  TempSrc: Temporal  SpO2: 100%  Weight: 174 lb 11.2 oz (79.2 kg)  Height: 5\' 7"  (1.702 m)   Body mass index is 27.36 kg/m.  PHYSICAL EXAMINATION: General: The patient appears her stated age.   HEENT:  No gross abnormalities Pulmonary: Respirations are non-labored, CTAB Abdomen: Soft and non-tender  Musculoskeletal: There are no major deformities.   Neurologic: No focal weakness or paresthesias are detected Skin: There are no ulcer or rashes noted. Psychiatric: The patient has normal affect. Cardiovascular: There is a regular rate and rhythm without significant murmur appreciated.   Left arm AVF, palpable thrill. Left radial pulse is 2+ palpable    Medical Decision Making  Meghan Richardson is a 38 y.o. female who presents requesting that her left arm AVF be removed.   Her left arm AV fistula is asymptomatic: no pain, no left upper extremity steal symptoms, palpable thrill at AVF, 2+ palpable left radial pulse.   Dr. Oneida Alar spoke with and examined pt.  He advised pt that the risk to benefit ratio argues against ligation of AVF or plication. And pt may need this AVF at some point.   Follow up as needed.   Clemon Chambers, RN, MSN, FNP-C [facility] Office: [phone]  07/29/2018, 12:47 PM  Clinic MD: Oneida Alar

## 2018-10-19 ENCOUNTER — Telehealth: Payer: PRIVATE HEALTH INSURANCE | Admitting: Nurse Practitioner

## 2018-10-19 DIAGNOSIS — Z20828 Contact with and (suspected) exposure to other viral communicable diseases: Secondary | ICD-10-CM

## 2018-10-19 DIAGNOSIS — R0981 Nasal congestion: Secondary | ICD-10-CM

## 2018-10-19 DIAGNOSIS — R05 Cough: Secondary | ICD-10-CM

## 2018-10-19 DIAGNOSIS — R059 Cough, unspecified: Secondary | ICD-10-CM

## 2018-10-19 DIAGNOSIS — Z20822 Contact with and (suspected) exposure to covid-19: Secondary | ICD-10-CM

## 2018-10-19 MED ORDER — BENZONATATE 100 MG PO CAPS: 100.0000 mg | ORAL_CAPSULE | Freq: Three times a day (TID) | ORAL | 0 refills | Status: DC | PRN

## 2018-10-19 NOTE — Progress Notes (Signed)
E-Visit for Corona Virus Screening   Your current symptoms could be consistent with the coronavirus.  Many health care providers can now test patients at their office but not all are.  [facility] has multiple testing sites. For information on our COVID testing locations and hours go to [email or web address]  Please quarantine yourself while awaiting your test results.  We are enrolling you in our MyChart Home [facility] for COVID19 . Daily you will receive a questionnaire within the MyChart website. Our COVID 19 response team willl be monitoriing your responses daily.    COVID-19 is a respiratory illness with symptoms that are similar to the flu. Symptoms are typically mild to moderate, but there have been cases of severe illness and death due to the virus. The following symptoms may appear 2-14 days after exposure: . Fever . Cough . Shortness of breath or difficulty breathing . Chills . Repeated shaking with chills . Muscle pain . Headache . Sore throat . New loss of taste or smell . Fatigue . Congestion or runny nose . Nausea or vomiting . Diarrhea  It is vitally important that if you feel that you have an infection such as this virus or any other virus that you stay home and away from places where you may spread it to others.  You should self-quarantine for 14 days if you have symptoms that could potentially be coronavirus or have been in close contact a with a person diagnosed with COVID-19 within the last 2 weeks. You should avoid contact with people age 65 and older.   You should wear a mask or cloth face covering over your nose and mouth if you must be around other people or animals, including pets (even at home). Try to stay at least 6 feet away from other people. This will protect the people around you.  You can use medication such as A prescription cough medication called Tessalon Perles 100 mg. You may take 1-2 capsules every 8 hours as needed for  cough  You may also take acetaminophen (Tylenol) as needed for fever.   Reduce your risk of any infection by using the same precautions used for avoiding the common cold or flu:  . Wash your hands often with soap and warm water for at least 20 seconds.  If soap and water are not readily available, use an alcohol-based hand sanitizer with at least 60% alcohol.  . If coughing or sneezing, cover your mouth and nose by coughing or sneezing into the elbow areas of your shirt or coat, into a tissue or into your sleeve (not your hands). . Avoid shaking hands with others and consider head nods or verbal greetings only. . Avoid touching your eyes, nose, or mouth with unwashed hands.  . Avoid close contact with people who are sick. . Avoid places or events with large numbers of people in one location, like concerts or sporting events. . Carefully consider travel plans you have or are making. . If you are planning any travel outside or inside the [place], visit the CDC's Travelers' Health webpage for the latest health notices. . If you have some symptoms but not all symptoms, continue to monitor at home and seek medical attention if your symptoms worsen. . If you are having a medical emergency, call 911.  HOME CARE . Only take medications as instructed by your medical team. . Drink plenty of fluids and get plenty of rest. . A steam or ultrasonic humidifier can help if you have congestion.     GET HELP RIGHT AWAY IF YOU HAVE EMERGENCY WARNING SIGNS** FOR COVID-19. If you or someone is showing any of these signs seek emergency medical care immediately. Call 911 or proceed to your closest emergency facility if: . You develop worsening high fever. . Trouble breathing . Bluish lips or face . Persistent pain or pressure in the chest . New confusion . Inability to wake or stay awake . You cough up blood. . Your symptoms become more severe  **This list is not all possible symptoms. Contact your medical  provider for any symptoms that are sever or concerning to you.   MAKE SURE YOU   Understand these instructions.  Will watch your condition.  Will get help right away if you are not doing well or get worse.  Your e-visit answers were reviewed by a board certified advanced clinical practitioner to complete your personal care plan.  Depending on the condition, your plan could have included both over the counter or prescription medications.  If there is a problem please reply once you have received a response from your provider.  Your safety is important to us.  If you have drug allergies check your prescription carefully.    You can use MyChart to ask questions about today's visit, request a non-urgent call back, or ask for a work or school excuse for 24 hours related to this e-Visit. If it has been greater than 24 hours you will need to follow up with your provider, or enter a new e-Visit to address those concerns. You will get an e-mail in the next two days asking about your experience.  I hope that your e-visit has been valuable and will speed your recovery. Thank you for using e-visits.  5-10 minutes spent reviewing and documenting in chart.   

## 2019-03-05 ENCOUNTER — Ambulatory Visit: Payer: PRIVATE HEALTH INSURANCE

## 2019-07-04 ENCOUNTER — Other Ambulatory Visit: Payer: Self-pay

## 2019-07-04 ENCOUNTER — Encounter (HOSPITAL_COMMUNITY): Payer: Self-pay

## 2019-07-04 ENCOUNTER — Ambulatory Visit (HOSPITAL_COMMUNITY)
Admission: RE | Admit: 2019-07-04 | Discharge: 2019-07-04 | Disposition: A | Discharge status: Home / Self Care | Payer: Medicare Other | Source: Ambulatory Visit | Attending: Family Medicine | Admitting: Family Medicine

## 2019-07-04 VITALS — BP 112/72 | HR 85 | Temp 98.5°F | Resp 16 | Ht 67.0 in | Wt 180.0 lb

## 2019-07-04 DIAGNOSIS — N760 Acute vaginitis: Secondary | ICD-10-CM | POA: Insufficient documentation

## 2019-07-04 MED ORDER — METRONIDAZOLE 500 MG PO TABS: 500.0000 mg | ORAL_TABLET | Freq: Two times a day (BID) | ORAL | 0 refills | Status: DC

## 2019-07-04 MED ORDER — FLUCONAZOLE 150 MG PO TABS: 150.0000 mg | ORAL_TABLET | Freq: Once | ORAL | 0 refills | Status: AC

## 2019-07-04 NOTE — ED Provider Notes (Signed)
[place]    CSN: [id number] Arrival date & time: 07/04/19  1058      History   Chief Complaint Chief Complaint  Patient presents with  . vaginal irritation    HPI Meghan Richardson is a 39 y.o. female.   HPI  Patient with a history of renal transplant presents today with a few days of vaginal irritation.  Patient has a history of recurrent BV however has not had an episode in some time.  Patient recently spent the weekend away from home and endorses some recent swimming in a pool.  She describes her symptoms as thick vaginal discharge with some external vulvar irritation.  She denies any urinary symptoms or any other associated symptoms precipitating the changes in vaginal discharge.  Her vital signs are stable today.  She has previously achieve relief with the symptoms in the past with metronidazole.  She denies a knowledge of previous yeast infections.  She is on chronic Prograf and prednisone for antirejection therapy related to renal transplant.  Past Medical History:  Diagnosis Date  . FSGS (focal segmental glomerulosclerosis)    Reports Kidney Bx at age 62  . Hypertension     Patient Active Problem List   Diagnosis Date Noted  . Peritonitis associated with peritoneal dialysis ([place]) 10/03/2014  . Hypertension due to end stage renal disease on dialysis ([place]) 10/03/2014  . Anemia in chronic kidney disease (CKD) 10/03/2014    Past Surgical History:  Procedure Laterality Date  . AV FISTULA PLACEMENT    . NEPHRECTOMY TRANSPLANTED ORGAN      OB History   No obstetric history on file.      Home Medications    Prior to Admission medications   Medication Sig Start Date End Date Taking? Authorizing Provider  aspirin EC 81 MG tablet Take 81 mg by mouth daily.    [provider]  benzonatate (TESSALON PERLES) 100 MG capsule Take 1 capsule (100 mg total) by mouth 3 (three) times daily as needed. 10/19/18   Hassell Done, Mary-Margaret, FNP  cefpodoxime (VANTIN)  200 MG tablet Take 1 tablet (200 mg total) by mouth 2 (two) times daily. 06/08/17   Earleen Newport, MD  Cinacalcet HCl (SENSIPAR PO) Take by mouth.    [provider]  cyclobenzaprine (FLEXERIL) 5 MG tablet Take 1-2 tablets (5-10 mg total) by mouth 2 (two) times daily as needed for muscle spasms. 07/26/18   Wieters, Hallie C, PA-C  fluconazole (DIFLUCAN) 150 MG tablet Take 1 tablet (150 mg total) by mouth once for 1 dose. Repeat if needed 07/04/19 07/04/19  Scot Jun, FNP  Labetalol HCl (NORMODYNE PO) Take by mouth.    [provider]  lisinopril (PRINIVIL,ZESTRIL) 10 MG tablet Take 10 mg by mouth 2 (two) times daily.    [provider]  metroNIDAZOLE (FLAGYL) 500 MG tablet Take 1 tablet (500 mg total) by mouth 2 (two) times daily. 07/04/19   Scot Jun, FNP  Mycophenolate Sodium (MYFORTIC PO) Take by mouth.    [provider]  predniSONE (DELTASONE PO) Take by mouth.    [provider]  Sulfamethoxazole-Trimethoprim (SEPTRA PO) Take by mouth.    [provider]  Tacrolimus (PROGRAF PO) Take by mouth.    [provider]    Family History Family History  Problem Relation Age of Onset  . Kidney disease Brother        FSGS  . Breast cancer Maternal Aunt   . Breast cancer Maternal  Grandmother     Social History Social History   Tobacco Use  . Smoking status: Former Research scientist (life sciences)  . Smokeless tobacco: Never Used  Substance Use Topics  . Alcohol use: No    Alcohol/week: 0.0 standard drinks  . Drug use: No     Allergies   Patient has no known allergies.   Review of Systems Review of Systems Pertinent negatives listed in HPI Physical Exam Triage Vital Signs ED Triage Vitals [07/04/19 1112]  Enc Vitals Group     BP 112/72     Pulse Rate 85     Resp 16     Temp 98.5 F (36.9 C)     Temp Source Oral     SpO2 98 %     Weight 180 lb (81.6 kg)     Height 5\' 7"  (1.702 m)     Head Circumference      Peak  Flow      Pain Score 8     Pain Loc      Pain Edu?      Excl. in [place]?    No data found.  Updated Vital Signs BP 112/72   Pulse 85   Temp 98.5 F (36.9 C) (Oral)   Resp 16   Ht 5\' 7"  (1.702 m)   Wt 180 lb (81.6 kg)   SpO2 98%   BMI 28.19 kg/m   Visual Acuity Right Eye Distance:   Left Eye Distance:   Bilateral Distance:    Right Eye Near:   Left Eye Near:    Bilateral Near:     Physical Exam General appearance: alert, well developed, well nourished Head: Normocephalic, without obvious abnormality, atraumatic Respiratory: Respirations even and unlabored, normal respiratory rate Heart: Rate and rhythm normal.  Skin: Skin color, texture, turgor normal. No rashes seen   UC Treatments / Results  Labs (all labs ordered are listed, but only abnormal results are displayed) Labs Reviewed  CERVICOVAGINAL ANCILLARY ONLY    EKG   Radiology No results found.  Procedures Procedures (including critical care time)  Medications Ordered in UC Medications - No data to display  Initial Impression / Assessment and Plan / UC Course  I have reviewed the triage vital signs and the nursing notes.  Pertinent labs & imaging results that were available during my care of the patient were reviewed by me and considered in my medical decision making (see chart for details).    Vaginal cytology pending.  Treating for acute vaginitis with metronidazole 500 mg twice daily x7 days.  Also prescribed Diflucan 150 mg once given patient is having some symptoms consistent with a candidal infection.  Patient advised that cytology will not be available for approximately 3 days or so however if any treatment changes with receipt of the vaginal specimen she will be notified via phone.  No changes in treatment patient will have access to the results via [place].  Final Clinical Impressions(s) / UC Diagnoses   Final diagnoses:  Vaginitis and vulvovaginitis   Discharge Instructions   None    ED  Prescriptions    Medication Sig Dispense Auth. Provider   fluconazole (DIFLUCAN) 150 MG tablet Take 1 tablet (150 mg total) by mouth once for 1 dose. Repeat if needed 1 tablet Scot Jun, FNP   metroNIDAZOLE (FLAGYL) 500 MG tablet Take 1 tablet (500 mg total) by mouth 2 (two) times daily. 14 tablet Scot Jun, FNP     PDMP not reviewed this encounter.  Scot Jun, FNP 07/04/19 1145

## 2019-07-04 NOTE — ED Triage Notes (Signed)
Pt c/o vaginal itching, white cottage cheese vaginal dischargex2 days.

## 2019-07-05 LAB — CERVICOVAGINAL ANCILLARY ONLY
Bacterial Vaginitis (gardnerella): POSITIVE — AB
Candida Glabrata: NEGATIVE
Candida Vaginitis: POSITIVE — AB
Chlamydia: NEGATIVE
Comment: NEGATIVE
Comment: NEGATIVE
Comment: NEGATIVE
Comment: NEGATIVE
Comment: NEGATIVE
Comment: NORMAL
Neisseria Gonorrhea: NEGATIVE
Trichomonas: NEGATIVE

## 2019-10-27 ENCOUNTER — Ambulatory Visit (HOSPITAL_COMMUNITY): Payer: Self-pay

## 2019-10-27 ENCOUNTER — Other Ambulatory Visit: Payer: Self-pay

## 2019-10-27 ENCOUNTER — Emergency Department (HOSPITAL_COMMUNITY)
Admission: EM | Admit: 2019-10-27 | Discharge: 2019-10-28 | Disposition: A | Discharge status: Home / Self Care | Payer: Medicare Other | Attending: Emergency Medicine | Admitting: Emergency Medicine

## 2019-10-27 ENCOUNTER — Encounter (HOSPITAL_COMMUNITY): Payer: Self-pay | Admitting: Emergency Medicine

## 2019-10-27 DIAGNOSIS — I129 Hypertensive chronic kidney disease with stage 1 through stage 4 chronic kidney disease, or unspecified chronic kidney disease: Secondary | ICD-10-CM | POA: Insufficient documentation

## 2019-10-27 DIAGNOSIS — Z992 Dependence on renal dialysis: Secondary | ICD-10-CM | POA: Diagnosis not present

## 2019-10-27 DIAGNOSIS — Z7982 Long term (current) use of aspirin: Secondary | ICD-10-CM | POA: Insufficient documentation

## 2019-10-27 DIAGNOSIS — R109 Unspecified abdominal pain: Secondary | ICD-10-CM | POA: Diagnosis not present

## 2019-10-27 DIAGNOSIS — N189 Chronic kidney disease, unspecified: Secondary | ICD-10-CM | POA: Insufficient documentation

## 2019-10-27 DIAGNOSIS — Z79899 Other long term (current) drug therapy: Secondary | ICD-10-CM | POA: Insufficient documentation

## 2019-10-27 DIAGNOSIS — N186 End stage renal disease: Secondary | ICD-10-CM | POA: Diagnosis not present

## 2019-10-27 LAB — URINALYSIS, ROUTINE W REFLEX MICROSCOPIC
Bilirubin Urine: NEGATIVE
Glucose, UA: NEGATIVE mg/dL
Hgb urine dipstick: NEGATIVE
Ketones, ur: NEGATIVE mg/dL
Leukocytes,Ua: NEGATIVE
Nitrite: NEGATIVE
Protein, ur: NEGATIVE mg/dL
Specific Gravity, Urine: 1.017 (ref 1.005–1.030)
pH: 7 (ref 5.0–8.0)

## 2019-10-27 LAB — CBC
HCT: 43.7 % (ref 36.0–46.0)
Hemoglobin: 14.4 g/dL (ref 12.0–15.0)
MCH: 25.9 pg — ABNORMAL LOW (ref 26.0–34.0)
MCHC: 33 g/dL (ref 30.0–36.0)
MCV: 78.5 fL — ABNORMAL LOW (ref 80.0–100.0)
Platelets: 234 10*3/uL (ref 150–400)
RBC: 5.57 MIL/uL — ABNORMAL HIGH (ref 3.87–5.11)
RDW: 14.7 % (ref 11.5–15.5)
WBC: 13.3 10*3/uL — ABNORMAL HIGH (ref 4.0–10.5)
nRBC: 0 % (ref 0.0–0.2)

## 2019-10-27 LAB — BASIC METABOLIC PANEL
Anion gap: 10 (ref 5–15)
BUN: 16 mg/dL (ref 6–20)
CO2: 23 mmol/L (ref 22–32)
Calcium: 9.4 mg/dL (ref 8.9–10.3)
Chloride: 108 mmol/L (ref 98–111)
Creatinine, Ser: 1.31 mg/dL — ABNORMAL HIGH (ref 0.44–1.00)
GFR, Estimated: 52 mL/min — ABNORMAL LOW (ref >60)
Glucose, Bld: 108 mg/dL — ABNORMAL HIGH (ref 70–99)
Potassium: 4.4 mmol/L (ref 3.5–5.1)
Sodium: 141 mmol/L (ref 135–145)

## 2019-10-27 LAB — I-STAT BETA HCG BLOOD, ED (MC, WL, AP ONLY): I-stat hCG, quantitative: <5 m[IU]/mL (ref <5)

## 2019-10-27 MED ORDER — ONDANSETRON HCL 4 MG/2ML IJ SOLN
4.0000 mg | Freq: Once | INTRAMUSCULAR | Status: AC
  Administered 2019-10-27: 4 mg via INTRAVENOUS
  Filled 2019-10-27: qty 2

## 2019-10-27 MED ORDER — OXYCODONE-ACETAMINOPHEN 5-325 MG PO TABS: 2.0000 | ORAL_TABLET | Freq: Four times a day (QID) | ORAL | 0 refills | Status: DC | PRN

## 2019-10-27 MED ORDER — ONDANSETRON 4 MG PO TBDP: 4.0000 mg | ORAL_TABLET | Freq: Three times a day (TID) | ORAL | 0 refills | Status: DC | PRN

## 2019-10-27 MED ORDER — FENTANYL CITRATE (PF) 100 MCG/2ML IJ SOLN
50.0000 ug | INTRAMUSCULAR | Status: AC
  Administered 2019-10-27: 50 ug via INTRAVENOUS
  Filled 2019-10-27: qty 2

## 2019-10-27 MED ORDER — FENTANYL CITRATE (PF) 100 MCG/2ML IJ SOLN
100.0000 ug | INTRAMUSCULAR | Status: DC | PRN
  Filled 2019-10-27: qty 2

## 2019-10-27 NOTE — Discharge Instructions (Signed)
Please go to [facility] in [place] last night to pick up your pain medicine and your nausea medicine.  Please drink plenty of water.  Please follow-up with your primary care doctor and your transplant doctor within the next 2 days.  You may return to ER for any new or concerning symptoms.

## 2019-10-27 NOTE — ED Provider Notes (Signed)
[facility] DEPT Provider Note   CSN: [id number] Arrival date & time: 10/27/19  1739     History Chief Complaint  Patient presents with  . Flank Pain    Meghan Richardson is a 39 y.o. female.  HPI Patient is a 39 year old female with past medical history of hypertension and kidney transplant.  Patient states that for the past 24 hours she has had severe burning sharp right side pain.  She states that it feels exactly like it did when she had shingles several years ago.  Patient states that the pain is 10/10, burning, has no aggravating or mitigating factors.  Came on gradually and progressively worsened.  She states that she had her fistula in her left arm removed on Friday.  She states that when she had the surgery to have this placed she had her last shingles outbreak.  She denies any fevers, chills, nausea, vomiting, changes in bowel movements and denies any urinary symptoms.  She has no history of kidney stones and states that she has had CT scans and ultrasounds without any renal stones in the past.      Past Medical History:  Diagnosis Date  . FSGS (focal segmental glomerulosclerosis)    Reports Kidney Bx at age 8  . Hypertension     Patient Active Problem List   Diagnosis Date Noted  . Peritonitis associated with peritoneal dialysis ([place]) 10/03/2014  . Hypertension due to end stage renal disease on dialysis ([place]) 10/03/2014  . Anemia in chronic kidney disease (CKD) 10/03/2014    Past Surgical History:  Procedure Laterality Date  . AV FISTULA PLACEMENT    . NEPHRECTOMY TRANSPLANTED ORGAN       OB History   No obstetric history on file.     Family History  Problem Relation Age of Onset  . Kidney disease Brother        FSGS  . Breast cancer Maternal Aunt   . Breast cancer Maternal Grandmother     Social History   Tobacco Use  . Smoking status: Former Research scientist (life sciences)  . Smokeless tobacco: Never Used  Substance Use Topics  . Alcohol  use: No    Alcohol/week: 0.0 standard drinks  . Drug use: No    Home Medications Prior to Admission medications   Medication Sig Start Date End Date Taking? Authorizing Provider  aspirin EC 81 MG tablet Take 81 mg by mouth daily.   Yes [provider]  cinacalcet (SENSIPAR) 30 MG tablet Take 30 mg by mouth daily. 07/20/19  Yes [provider]  ibuprofen (ADVIL) 600 MG tablet Take 600 mg by mouth every 6 (six) hours as needed for moderate pain.  09/12/19  Yes [provider]  labetalol (NORMODYNE) 100 MG tablet Take 100 mg by mouth 2 (two) times daily. 05/20/19  Yes [provider]  magnesium oxide (MAG-OX) 400 MG tablet Take 400 mg by mouth 2 (two) times daily.   Yes [provider]  mycophenolate (MYFORTIC) 180 MG EC tablet Take 720 mg by mouth 2 (two) times daily. 07/20/19  Yes [provider]  predniSONE (DELTASONE) 5 MG tablet Take 5 mg by mouth daily with breakfast.   Yes [provider]  PROGRAF 5 MG capsule Take 5 mg by mouth 2 (two) times daily. 07/20/19  Yes [provider]  sodium bicarbonate 650 MG tablet Take 1,300 mg by mouth 2 (two) times daily.  07/20/19  Yes [provider]  tacrolimus (PROGRAF) 1 MG capsule  Take 3 mg by mouth 2 (two) times daily.    Yes [provider]  benzonatate (TESSALON PERLES) 100 MG capsule Take 1 capsule (100 mg total) by mouth 3 (three) times daily as needed. 10/19/18   Hassell Done, Mary-Margaret, FNP  cefpodoxime (VANTIN) 200 MG tablet Take 1 tablet (200 mg total) by mouth 2 (two) times daily. Patient not taking: Reported on 10/27/2019 06/08/17   Earleen Newport, MD  cyclobenzaprine (FLEXERIL) 5 MG tablet Take 1-2 tablets (5-10 mg total) by mouth 2 (two) times daily as needed for muscle spasms. 07/26/18   Wieters, Hallie C, PA-C  metroNIDAZOLE (FLAGYL) 500 MG tablet Take 1 tablet (500 mg total) by mouth 2 (two) times daily. 07/04/19   Scot Jun, FNP  ondansetron  (ZOFRAN ODT) 4 MG disintegrating tablet Take 1 tablet (4 mg total) by mouth every 8 (eight) hours as needed for nausea or vomiting. 10/27/19   Tedd Sias, PA  oxyCODONE-acetaminophen (PERCOCET/ROXICET) 5-325 MG tablet Take 2 tablets by mouth every 6 (six) hours as needed for severe pain. 10/27/19   Tedd Sias, PA    Allergies    Patient has no known allergies.  Review of Systems   Review of Systems  Constitutional: Negative for chills and fever.  HENT: Negative for congestion.   Eyes: Negative for pain.  Respiratory: Negative for cough and shortness of breath.   Cardiovascular: Negative for chest pain and leg swelling.  Gastrointestinal: Negative for abdominal pain and vomiting.  Genitourinary: Negative for dysuria.  Musculoskeletal: Negative for myalgias.  Skin: Negative for rash.       Burning stinging pain along the right side of her torso  Neurological: Negative for dizziness and headaches.    Physical Exam Updated Vital Signs BP (!) 150/99   Pulse 71   Temp 99.4 F (37.4 C)   Resp 20   Ht 5\' 7"  (1.702 m)   Wt 82 kg   SpO2 99%   BMI 28.31 kg/m   Physical Exam Vitals and nursing note reviewed.  Constitutional:      General: She is in acute distress.     Comments: Patient is in severe pain.  Initial evaluation stalled so that fentanyl can be given.  Much improved after.  Patient now pleasant, cooperative, able to answer questions appropriately follow commands.  HENT:     Head: Normocephalic and atraumatic.     Nose: Nose normal.     Mouth/Throat:     Mouth: Mucous membranes are moist.  Eyes:     General: No scleral icterus. Cardiovascular:     Rate and Rhythm: Normal rate and regular rhythm.     Pulses: Normal pulses.     Heart sounds: Normal heart sounds.  Pulmonary:     Effort: Pulmonary effort is normal. No respiratory distress.     Breath sounds: No wheezing.  Abdominal:     Palpations: Abdomen is soft.     Tenderness: There is no abdominal  tenderness. There is no right CVA tenderness, left CVA tenderness, guarding or rebound.  Musculoskeletal:     Cervical back: Normal range of motion.     Right lower leg: No edema.     Left lower leg: No edema.     Comments: No tenderness to palpation of the chest or back/midline vertebra.  Skin:    General: Skin is warm and dry.     Capillary Refill: Capillary refill takes less than 2 seconds.     Comments: No rashes  or lesions to the thorax or abdomen.  Small laparoscopic well-healed surgical scars to the abdomen.  Neurological:     Mental Status: She is alert. Mental status is at baseline.  Psychiatric:        Mood and Affect: Mood normal.        Behavior: Behavior normal.     ED Results / Procedures / Treatments   Labs (all labs ordered are listed, but only abnormal results are displayed) Labs Reviewed  URINALYSIS, ROUTINE W REFLEX MICROSCOPIC - Abnormal; Notable for the following components:      Result Value   APPearance HAZY (*)    All other components within normal limits  CBC - Abnormal; Notable for the following components:   WBC 13.3 (*)    RBC 5.57 (*)    MCV 78.5 (*)    MCH 25.9 (*)    All other components within normal limits  BASIC METABOLIC PANEL - Abnormal; Notable for the following components:   Glucose, Bld 108 (*)    Creatinine, Ser 1.31 (*)    GFR, Estimated 52 (*)    All other components within normal limits  I-STAT BETA HCG BLOOD, ED ([facility], [facility], [facility] ONLY)    EKG None  Radiology No results found.  Procedures Procedures (including critical care time)  Medications Ordered in ED Medications  ondansetron (ZOFRAN) injection 4 mg (4 mg Intravenous Given 10/27/19 2337)  fentaNYL (SUBLIMAZE) injection 50 mcg (50 mcg Intravenous Given 10/27/19 2342)    ED Course  I have reviewed the triage vital signs and the nursing notes.  Pertinent labs & imaging results that were available during my care of the patient were reviewed by me and considered in my  medical decision making (see chart for details).    MDM Rules/Calculators/A&P                          Patient 39 year old female with a history of renal transplant at [facility] presented today with severe right side pain he states this is identical to her symptoms that she had last time she had shingles.  Physical exam is notable for no rash no tenderness of the abdomen or thorax.  She is in severe pain initially and required 100 mcg of fentanyl before I could evaluate her.  After dose of pain medicine she is significantly improved.  IV patient's lab work her BMP shows some elevation in creatinine at 1.31 she follows with transplant clinic and states that she will be able to have follow-up blood work done tomorrow morning.  CBC has mild leukocytosis likely secondary to prednisone she is chronically on.  No anemia.  Urinalysis without any hematuria.  No evidence of infection.  Vital signs within normal limits apart from elevated blood pressure likely secondary to pain.  Blood pressure did improve with pain control.  I discussed this case with my attending physician who cosigned this note including patient's presenting symptoms, physical exam, and planned diagnostics and interventions. Attending physician stated agreement with plan or made changes to plan which were implemented.   Patient agreeable to plan to hold off on treating with antivirals given that this could be an issue of her kidney function will instead provide patient with pain medicine and have her closely follow-up with her transplant team and PCP.  She will have blood work drawn tomorrow and be reevaluated.  Given return precautions.  She is tolerating p.o.  Discharged with Zofran and Percocet.  Final  Clinical Impression(s) / ED Diagnoses Final diagnoses:  Flank pain  Side pain    Rx / DC Orders ED Discharge Orders         Ordered    oxyCODONE-acetaminophen (PERCOCET/ROXICET) 5-325 MG tablet  Every 6 hours PRN        10/27/19 2325     ondansetron (ZOFRAN ODT) 4 MG disintegrating tablet  Every 8 hours PRN        10/27/19 2356           Pati Gallo [place], [place] 10/28/19 0102    Milton Ferguson, MD 10/29/19 1114

## 2019-10-27 NOTE — ED Triage Notes (Signed)
Arrives via EMS from home, C/C R side pain.Patient just had her fistula removed 5 days ago. Hx of R sided kidney transplant 2 years ago (2019). Hx of shingles in 2016. EMS reports HTN, pt reports compliance with home medication regimine. EMS gave 2 doses of 50 mcg of Fentanyl, 15 min apart, 4 mg Zofran. [place]

## 2019-10-28 ENCOUNTER — Other Ambulatory Visit: Payer: Self-pay

## 2019-10-28 ENCOUNTER — Emergency Department: Payer: Medicare Other

## 2019-10-28 ENCOUNTER — Encounter: Payer: Self-pay | Admitting: *Deleted

## 2019-10-28 DIAGNOSIS — Z20822 Contact with and (suspected) exposure to covid-19: Secondary | ICD-10-CM | POA: Insufficient documentation

## 2019-10-28 DIAGNOSIS — N186 End stage renal disease: Secondary | ICD-10-CM | POA: Insufficient documentation

## 2019-10-28 DIAGNOSIS — Z79899 Other long term (current) drug therapy: Secondary | ICD-10-CM | POA: Insufficient documentation

## 2019-10-28 DIAGNOSIS — Z7982 Long term (current) use of aspirin: Secondary | ICD-10-CM | POA: Insufficient documentation

## 2019-10-28 DIAGNOSIS — N179 Acute kidney failure, unspecified: Secondary | ICD-10-CM | POA: Diagnosis not present

## 2019-10-28 DIAGNOSIS — Z87891 Personal history of nicotine dependence: Secondary | ICD-10-CM | POA: Diagnosis not present

## 2019-10-28 DIAGNOSIS — J181 Lobar pneumonia, unspecified organism: Secondary | ICD-10-CM | POA: Insufficient documentation

## 2019-10-28 DIAGNOSIS — I120 Hypertensive chronic kidney disease with stage 5 chronic kidney disease or end stage renal disease: Secondary | ICD-10-CM | POA: Insufficient documentation

## 2019-10-28 DIAGNOSIS — R0781 Pleurodynia: Secondary | ICD-10-CM | POA: Diagnosis present

## 2019-10-28 LAB — CBC
HCT: 40.1 % (ref 36.0–46.0)
Hemoglobin: 13.4 g/dL (ref 12.0–15.0)
MCH: 26 pg (ref 26.0–34.0)
MCHC: 33.4 g/dL (ref 30.0–36.0)
MCV: 77.7 fL — ABNORMAL LOW (ref 80.0–100.0)
Platelets: 213 10*3/uL (ref 150–400)
RBC: 5.16 MIL/uL — ABNORMAL HIGH (ref 3.87–5.11)
RDW: 14.6 % (ref 11.5–15.5)
WBC: 12.6 10*3/uL — ABNORMAL HIGH (ref 4.0–10.5)
nRBC: 0 % (ref 0.0–0.2)

## 2019-10-28 LAB — BASIC METABOLIC PANEL
Anion gap: 9 (ref 5–15)
BUN: 20 mg/dL (ref 6–20)
CO2: 25 mmol/L (ref 22–32)
Calcium: 9.4 mg/dL (ref 8.9–10.3)
Chloride: 106 mmol/L (ref 98–111)
Creatinine, Ser: 1.87 mg/dL — ABNORMAL HIGH (ref 0.44–1.00)
GFR, Estimated: 34 mL/min — ABNORMAL LOW (ref >60)
Glucose, Bld: 89 mg/dL (ref 70–99)
Potassium: 3.9 mmol/L (ref 3.5–5.1)
Sodium: 140 mmol/L (ref 135–145)

## 2019-10-28 LAB — PROTIME-INR
INR: 1 (ref 0.8–1.2)
Prothrombin Time: 12.8 seconds (ref 11.4–15.2)

## 2019-10-28 LAB — RESPIRATORY PANEL BY RT PCR (FLU A&B, COVID)
Influenza A by PCR: NEGATIVE
Influenza B by PCR: NEGATIVE
SARS Coronavirus 2 by RT PCR: NEGATIVE

## 2019-10-28 LAB — TROPONIN I (HIGH SENSITIVITY)
Troponin I (High Sensitivity): 5 ng/L (ref <18)
Troponin I (High Sensitivity): 4 ng/L (ref <18)

## 2019-10-28 NOTE — ED Triage Notes (Signed)
Pt c/o R chest and R rib pain x 2 days, worsening today. Pt has low grade fever in triage. Pt denies n/v/d. 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 Pt s/p surgery on L arm 10/22/19 and had negative covid tests up until day of surgery.

## 2019-10-28 NOTE — ED Notes (Signed)
Discharged with no concerns  

## 2019-10-29 ENCOUNTER — Emergency Department
Admission: EM | Admit: 2019-10-29 | Discharge: 2019-10-29 | Disposition: A | Discharge status: Home / Self Care | Payer: Medicare Other | Attending: Emergency Medicine | Admitting: Emergency Medicine

## 2019-10-29 DIAGNOSIS — R091 Pleurisy: Secondary | ICD-10-CM

## 2019-10-29 DIAGNOSIS — I10 Essential (primary) hypertension: Secondary | ICD-10-CM

## 2019-10-29 DIAGNOSIS — J189 Pneumonia, unspecified organism: Secondary | ICD-10-CM

## 2019-10-29 DIAGNOSIS — N179 Acute kidney failure, unspecified: Secondary | ICD-10-CM

## 2019-10-29 LAB — POC URINE PREG, ED: Preg Test, Ur: NEGATIVE

## 2019-10-29 LAB — LACTIC ACID, PLASMA: Lactic Acid, Venous: 0.7 mmol/L (ref 0.5–1.9)

## 2019-10-29 MED ORDER — LABETALOL HCL 5 MG/ML IV SOLN
10.0000 mg | Freq: Once | INTRAVENOUS | Status: AC
  Administered 2019-10-29: 10 mg via INTRAVENOUS

## 2019-10-29 MED ORDER — LABETALOL HCL 5 MG/ML IV SOLN
INTRAVENOUS | Status: AC
  Filled 2019-10-29: qty 4

## 2019-10-29 MED ORDER — SODIUM CHLORIDE 0.9 % IV BOLUS
1000.0000 mL | Freq: Once | INTRAVENOUS | Status: AC
  Administered 2019-10-29: 1000 mL via INTRAVENOUS

## 2019-10-29 MED ORDER — ONDANSETRON HCL 4 MG/2ML IJ SOLN
INTRAMUSCULAR | Status: AC
  Filled 2019-10-29: qty 2

## 2019-10-29 MED ORDER — ONDANSETRON HCL 4 MG/2ML IJ SOLN
4.0000 mg | Freq: Once | INTRAMUSCULAR | Status: AC
  Administered 2019-10-29: 4 mg via INTRAVENOUS

## 2019-10-29 MED ORDER — SODIUM CHLORIDE 0.9 % IV SOLN
500.0000 mg | Freq: Once | INTRAVENOUS | Status: AC
  Administered 2019-10-29: 500 mg via INTRAVENOUS
  Filled 2019-10-29: qty 500

## 2019-10-29 MED ORDER — SODIUM CHLORIDE 0.9 % IV SOLN
1.0000 g | Freq: Once | INTRAVENOUS | Status: AC
  Administered 2019-10-29: 1 g via INTRAVENOUS
  Filled 2019-10-29: qty 10

## 2019-10-29 MED ORDER — AMOXICILLIN-POT CLAVULANATE 875-125 MG PO TABS: 1.0000 | ORAL_TABLET | Freq: Two times a day (BID) | ORAL | 0 refills | Status: DC

## 2019-10-29 NOTE — Discharge Instructions (Addendum)
Take antibiotic as prescribed (Augmentin 875mg  twice daily x 7 days). Return to the ER for worsening symptoms, persistent vomiting, difficulty breathing or other concerns.

## 2019-10-29 NOTE — ED Notes (Signed)
Pt starts feeling sick to her stomach at this time. Pt notifies this RN, Dr. Beather Arbour notified and zithromax stopped due to potential reaction. Zofran ordered

## 2019-10-29 NOTE — ED Provider Notes (Signed)
[facility] Emergency Department Provider Note   ____________________________________________   First MD Initiated Contact with Patient 10/29/19 [phone]     (approximate)  I have reviewed the triage vital signs and the nursing notes.   HISTORY  Chief Complaint Pleurisy    HPI Meghan Richardson is a 39 y.o. female who presents to the ED from home with a chief complaint of right chest and rib pain x2 days.  Patient has a history of hypertension, FSGS status post kidney transplant, recent LUE AV fistula removal on 10/22/2019.  Patient was seen at [facility] for same yesterday, evaluated for shingles as she presented similarly in the past with shingles after surgery; found to not have shingles and discharged home with analgesics.  Denies fever, chills, shortness of breath, abdominal pain, nausea, vomiting or dizziness.  Denies COVID-19 exposure.     Past Medical History:  Diagnosis Date  . FSGS (focal segmental glomerulosclerosis)    Reports Kidney Bx at age 81  . Hypertension     Patient Active Problem List   Diagnosis Date Noted  . Peritonitis associated with peritoneal dialysis ([place]) 10/03/2014  . Hypertension due to end stage renal disease on dialysis ([place]) 10/03/2014  . Anemia in chronic kidney disease (CKD) 10/03/2014    Past Surgical History:  Procedure Laterality Date  . AV FISTULA PLACEMENT    . NEPHRECTOMY TRANSPLANTED ORGAN      Prior to Admission medications   Medication Sig Start Date End Date Taking? Authorizing Provider  amoxicillin-clavulanate (AUGMENTIN) 875-125 MG tablet Take 1 tablet by mouth 2 (two) times daily. 10/29/19   Paulette Blanch, MD  aspirin EC 81 MG tablet Take 81 mg by mouth daily.    [provider]  benzonatate (TESSALON PERLES) 100 MG capsule Take 1 capsule (100 mg total) by mouth 3 (three) times daily as needed. 10/19/18   Hassell Done, Mary-Margaret, FNP  cefpodoxime (VANTIN) 200 MG tablet Take 1 tablet (200 mg total) by  mouth 2 (two) times daily. Patient not taking: Reported on 10/27/2019 06/08/17   Earleen Newport, MD  cinacalcet (SENSIPAR) 30 MG tablet Take 30 mg by mouth daily. 07/20/19   [provider]  cyclobenzaprine (FLEXERIL) 5 MG tablet Take 1-2 tablets (5-10 mg total) by mouth 2 (two) times daily as needed for muscle spasms. 07/26/18   Wieters, Hallie C, PA-C  ibuprofen (ADVIL) 600 MG tablet Take 600 mg by mouth every 6 (six) hours as needed for moderate pain.  09/12/19   [provider]  labetalol (NORMODYNE) 100 MG tablet Take 100 mg by mouth 2 (two) times daily. 05/20/19   [provider]  magnesium oxide (MAG-OX) 400 MG tablet Take 400 mg by mouth 2 (two) times daily.    [provider]  metroNIDAZOLE (FLAGYL) 500 MG tablet Take 1 tablet (500 mg total) by mouth 2 (two) times daily. 07/04/19   Scot Jun, FNP  mycophenolate (MYFORTIC) 180 MG EC tablet Take 720 mg by mouth 2 (two) times daily. 07/20/19   [provider]  ondansetron (ZOFRAN ODT) 4 MG disintegrating tablet Take 1 tablet (4 mg total) by mouth every 8 (eight) hours as needed for nausea or vomiting. 10/27/19   Tedd Sias, PA  oxyCODONE-acetaminophen (PERCOCET/ROXICET) 5-325 MG tablet Take 2 tablets by mouth every 6 (six) hours as needed for severe pain. 10/27/19   Tedd Sias, PA  predniSONE (DELTASONE) 5 MG tablet Take 5 mg by mouth daily with breakfast.  [provider]  PROGRAF 5 MG capsule Take 5 mg by mouth 2 (two) times daily. 07/20/19   [provider]  sodium bicarbonate 650 MG tablet Take 1,300 mg by mouth 2 (two) times daily.  07/20/19   [provider]  tacrolimus (PROGRAF) 1 MG capsule Take 3 mg by mouth 2 (two) times daily.     [provider]    Allergies Patient has no known allergies.  Family History  Problem Relation Age of Onset  . Kidney disease Brother        FSGS  . Breast cancer Maternal Aunt   . Breast cancer Maternal  Grandmother     Social History Social History   Tobacco Use  . Smoking status: Former Research scientist (life sciences)  . Smokeless tobacco: Never Used  Substance Use Topics  . Alcohol use: No    Alcohol/week: 0.0 standard drinks  . Drug use: No    Review of Systems  Constitutional: No fever/chills Eyes: No visual changes. ENT: No sore throat. Cardiovascular: Positive for chest pain. Respiratory: Denies shortness of breath. Gastrointestinal: No abdominal pain.  No nausea, no vomiting.  No diarrhea.  No constipation. Genitourinary: Negative for dysuria. Musculoskeletal: Negative for back pain. Skin: Negative for rash. Neurological: Negative for headaches, focal weakness or numbness.   ____________________________________________   PHYSICAL EXAM:  VITAL SIGNS: ED Triage Vitals  Enc Vitals Group     BP 10/28/19 2016 (!) 152/99     Pulse Rate 10/28/19 2016 81     Resp 10/28/19 2016 20     Temp 10/28/19 2016 100.1 F (37.8 C)     Temp Source 10/28/19 2016 Oral     SpO2 10/28/19 2016 95 %     Weight 10/28/19 2018 190 lb (86.2 kg)     Height 10/28/19 2018 _0  (1.702 m)     Head Circumference --      Peak Flow --      Pain Score 10/28/19 2017 10     Pain Loc --      Pain Edu? --      Excl. in [place]? --     Constitutional: Alert and oriented. Well appearing and in no acute distress. Eyes: Conjunctivae are normal. PERRL. EOMI. Head: Atraumatic. Nose: No congestion/rhinnorhea. Mouth/Throat: Mucous membranes are mildly dry.   Neck: No stridor.   Cardiovascular: Normal rate, regular rhythm. Grossly normal heart sounds.  Good peripheral circulation. Respiratory: Normal respiratory effort.  No retractions. Lungs with faint rales in the right lower lobe. Gastrointestinal: Soft and nontender. No distention. No abdominal bruits. No CVA tenderness. Musculoskeletal: No lower extremity tenderness nor edema.  No calf tenderness.  No joint effusions. Neurologic:  Normal speech and language. No gross  focal neurologic deficits are appreciated. No gait instability. Skin:  Skin is warm, dry and intact. No rash noted. Psychiatric: Mood and affect are normal. Speech and behavior are normal.  ____________________________________________   LABS (all labs ordered are listed, but only abnormal results are displayed)  Labs Reviewed  BASIC METABOLIC PANEL - Abnormal; Notable for the following components:      Result Value   Creatinine, Ser 1.87 (*)    GFR, Estimated 34 (*)    All other components within normal limits  CBC - Abnormal; Notable for the following components:   WBC 12.6 (*)    RBC 5.16 (*)    MCV 77.7 (*)    All other components within normal limits  POC URINE PREG, ED - Normal  RESPIRATORY PANEL BY RT PCR (FLU A&B, COVID)  CULTURE, BLOOD (ROUTINE X 2)  CULTURE, BLOOD (ROUTINE X 2)  PROTIME-INR  LACTIC ACID, PLASMA  TROPONIN I (HIGH SENSITIVITY)  TROPONIN I (HIGH SENSITIVITY)   ____________________________________________  EKG  ED ECG REPORT I, Linas Stepter J, the attending physician, personally viewed and interpreted this ECG.   Date: 10/29/2019  EKG Time: 2030  Rate: 78  Rhythm: normal EKG, normal sinus rhythm  Axis: Normal  Intervals:none  ST&T Change: Nonspecific  ____________________________________________  RADIOLOGY I, Josephine Rudnick J, personally viewed and evaluated these images (plain radiographs) as part of my medical decision making, as well as reviewing the written report by the radiologist.  ED MD interpretation: Right lower lobe pneumonia  Official radiology report(s): DG Chest 2 View  Result Date: 10/28/2019 CLINICAL DATA:  39 year old female with right side chest pain for 2 days, low-grade fever. No known COVID-19. Exposure. EXAM: CHEST - 2 VIEW COMPARISON:  Chest radiographs 05/18/2016. FINDINGS: Mildly lower lung volumes. Mediastinal contours remain normal. The left lung appears clear. No pneumothorax. But there is confluent abnormal right  lateral lung base opacity on the PA view, which partially projects over the posterior heart border on the lateral. This could be in the lateral segment of the middle lobe or the lateral basal segment of the lower lobe. No definite pleural effusion. No acute osseous abnormality identified. Negative visible bowel gas pattern. IMPRESSION: Right lung base pneumonia. Followup PA and lateral chest X-ray is recommended in 3-4 weeks following trial of antibiotic therapy to ensure resolution and exclude underlying malignancy. Electronically Signed   By: Genevie Ann M.D.   On: 10/28/2019 20:52    ____________________________________________   PROCEDURES  Procedure(s) performed (including Critical Care):  .1-3 Lead EKG Interpretation Performed by: Paulette Blanch, MD Authorized by: Paulette Blanch, MD     Interpretation: normal     ECG rate:  80   ECG rate assessment: normal     Rhythm: sinus rhythm     Ectopy: none     Conduction: normal   Comments:     Patient placed on cardiac monitor to evaluate for arrhythmias    ____________________________________________   INITIAL IMPRESSION / ASSESSMENT AND PLAN / ED COURSE  As part of my medical decision making, I reviewed the following data within the [facility] notes reviewed and incorporated, Labs reviewed, EKG interpreted, Old chart reviewed, Radiograph reviewed and Notes from prior ED visits     39 year old female status post renal transplant with recent AV fistula removal presenting with right-sided pleurisy. Differential diagnosis includes, but is not limited to, ACS, aortic dissection, pulmonary embolism, cardiac tamponade, pneumothorax, pneumonia, pericarditis, myocarditis, GI-related causes including esophagitis/gastritis, and musculoskeletal chest wall pain.    Laboratory results demonstrate mild leukocytosis, creatinine elevated from baseline.  Right lower lobe pneumonia seen on chest x-ray.  Will check blood cultures,  lactic acid, initiate IV fluid hydration, IV antibiotics.  Patient is PERC negative; she had MAC anesthesia rather than general anesthesia.  Given patient is not tachycardic, tachypneic nor hypoxic, feel risk of CTA chest to evaluate for PE would be greater then its benefit due to patient's kidney disease/transplant/elevated creatinine.   Clinical Course as of Oct 28 653  Fri Oct 29, 2019  0527 Patient began to vomit with the administration of IV azithromycin.  No rash at IV site.  No angioedema.  Azithromycin was discontinued.  Will administer Zofran and PO challenge.   [JS]  0554 Small dose  labetalol given for elevated blood pressure.  Patient will take her morning dose as usual.   [JS]  0654 Blood pressure improved, patient felt better, tolerated PO and was discharged in good and stable condition with Augmentin.  She will contact her transplant coordinator to repeat creatinine.  Strict return precautions were given.  Patient verbalized understanding and agree with plan of care.   [JS]    Clinical Course User Index [JS] Paulette Blanch, MD     ____________________________________________   FINAL CLINICAL IMPRESSION(S) / ED DIAGNOSES  Final diagnoses:  Community acquired pneumonia of right lower lobe of lung  Pleurisy  AKI (acute kidney injury) ([place])  Hypertension, unspecified type     ED Discharge Orders         Ordered    amoxicillin-clavulanate (AUGMENTIN) 875-125 MG tablet  2 times daily        10/29/19 0607          *Please note:  Meghan Richardson was evaluated in Emergency Department on 10/29/2019 for the symptoms described in the history of present illness. She was evaluated in the context of the global COVID-19 pandemic, which necessitated consideration that the patient might be at risk for infection with the SARS-CoV-2 virus that causes COVID-19. Institutional protocols and algorithms that pertain to the evaluation of patients at risk for COVID-19 are in a state of rapid  change based on information released by regulatory bodies including the CDC and federal and state organizations. These policies and algorithms were followed during the patient's care in the ED.  Some ED evaluations and interventions may be delayed as a result of limited staffing during and the pandemic.*   Note:  This document was prepared using Dragon voice recognition software and may include unintentional dictation errors.   Paulette Blanch, MD 10/29/19 [phone]

## 2019-11-03 LAB — CULTURE, BLOOD (ROUTINE X 2)
Culture: NO GROWTH
Culture: NO GROWTH
Special Requests: ADEQUATE
Special Requests: ADEQUATE

## 2019-12-17 ENCOUNTER — Other Ambulatory Visit: Payer: Self-pay

## 2019-12-17 ENCOUNTER — Ambulatory Visit
Admission: RE | Admit: 2019-12-17 | Discharge: 2019-12-17 | Disposition: A | Discharge status: Home / Self Care | Payer: PRIVATE HEALTH INSURANCE | Source: Ambulatory Visit | Attending: Family Medicine | Admitting: Family Medicine

## 2019-12-17 ENCOUNTER — Telehealth: Payer: Self-pay

## 2019-12-17 VITALS — BP 119/87 | HR 81 | Temp 98.6°F | Resp 16

## 2019-12-17 DIAGNOSIS — L03032 Cellulitis of left toe: Secondary | ICD-10-CM | POA: Diagnosis not present

## 2019-12-17 MED ORDER — DOXYCYCLINE HYCLATE 100 MG PO CAPS: 100.0000 mg | ORAL_CAPSULE | Freq: Two times a day (BID) | ORAL | 0 refills | Status: DC

## 2019-12-17 MED ORDER — DOXYCYCLINE HYCLATE 100 MG PO CAPS
100.0000 mg | ORAL_CAPSULE | Freq: Two times a day (BID) | ORAL | 0 refills | Status: DC
Start: 2019-12-17 — End: 2022-12-21

## 2019-12-17 NOTE — ED Triage Notes (Signed)
Pt said in grown toe nail out about 1 month ago and every since then she has been having pain in the nail bed. No redness no swelling. Pt has tried all types of antifungal and soaking nothing works.

## 2019-12-17 NOTE — ED Provider Notes (Signed)
[name]    CSN: [id number] Arrival date & time: 12/17/19  1334      History   Chief Complaint Chief Complaint  Patient presents with  . Toe Pain  . appt 2    HPI Meghan Richardson is a 39 y.o. female.   HPI  Patient presents with left great toe pain.  Patient has been visiting a nail shop who has been performing her pedicures on her toe.  She reports about 4 weeks ago she noticed an ingrown toe nail hanging in the nail technician attempted to remove it since that time she has had increasing tenderness and pain localized to the nailbed.  She is also having  irregularity in the growth pattern of her toenail. Nailbed is nondraining. She is negative of fever, nausea, or vomiting  Past Medical History:  Diagnosis Date  . FSGS (focal segmental glomerulosclerosis)    Reports Kidney Bx at age 30  . Hypertension     Patient Active Problem List   Diagnosis Date Noted  . Peritonitis associated with peritoneal dialysis ([place]) 10/03/2014  . Hypertension due to end stage renal disease on dialysis ([place]) 10/03/2014  . Anemia in chronic kidney disease (CKD) 10/03/2014    Past Surgical History:  Procedure Laterality Date  . AV FISTULA PLACEMENT    . NEPHRECTOMY TRANSPLANTED ORGAN      OB History   No obstetric history on file.      Home Medications    Prior to Admission medications   Medication Sig Start Date End Date Taking? Authorizing Provider  amoxicillin-clavulanate (AUGMENTIN) 875-125 MG tablet Take 1 tablet by mouth 2 (two) times daily. 10/29/19   Paulette Blanch, MD  aspirin EC 81 MG tablet Take 81 mg by mouth daily.    [provider]  benzonatate (TESSALON PERLES) 100 MG capsule Take 1 capsule (100 mg total) by mouth 3 (three) times daily as needed. 10/19/18   Hassell Done, Mary-Margaret, FNP  cefpodoxime (VANTIN) 200 MG tablet Take 1 tablet (200 mg total) by mouth 2 (two) times daily. Patient not taking: Reported on 10/27/2019 06/08/17   Earleen Newport, MD  cinacalcet (SENSIPAR) 30 MG tablet Take 30 mg by mouth daily. 07/20/19   [provider]  cyclobenzaprine (FLEXERIL) 5 MG tablet Take 1-2 tablets (5-10 mg total) by mouth 2 (two) times daily as needed for muscle spasms. 07/26/18   Wieters, Hallie C, PA-C  ibuprofen (ADVIL) 600 MG tablet Take 600 mg by mouth every 6 (six) hours as needed for moderate pain.  09/12/19   [provider]  labetalol (NORMODYNE) 100 MG tablet Take 100 mg by mouth 2 (two) times daily. 05/20/19   [provider]  magnesium oxide (MAG-OX) 400 MG tablet Take 400 mg by mouth 2 (two) times daily.    [provider]  metroNIDAZOLE (FLAGYL) 500 MG tablet Take 1 tablet (500 mg total) by mouth 2 (two) times daily. 07/04/19   Scot Jun, FNP  mycophenolate (MYFORTIC) 180 MG EC tablet Take 720 mg by mouth 2 (two) times daily. 07/20/19   [provider]  ondansetron (ZOFRAN ODT) 4 MG disintegrating tablet Take 1 tablet (4 mg total) by mouth every 8 (eight) hours as needed for nausea or vomiting. 10/27/19   Tedd Sias, PA  oxyCODONE-acetaminophen (PERCOCET/ROXICET) 5-325 MG tablet Take 2 tablets by mouth every 6 (six) hours as needed for severe pain. 10/27/19   Tedd Sias, PA  predniSONE (DELTASONE) 5 MG tablet Take  5 mg by mouth daily with breakfast.    [provider]  PROGRAF 5 MG capsule Take 5 mg by mouth 2 (two) times daily. 07/20/19   [provider]  sodium bicarbonate 650 MG tablet Take 1,300 mg by mouth 2 (two) times daily.  07/20/19   [provider]  tacrolimus (PROGRAF) 1 MG capsule Take 3 mg by mouth 2 (two) times daily.     [provider]    Family History Family History  Problem Relation Age of Onset  . Kidney disease Brother        FSGS  . Breast cancer Maternal Aunt   . Breast cancer Maternal Grandmother     Social History Social History   Tobacco Use  . Smoking status: Former Research scientist (life sciences)  . Smokeless tobacco: Never  Used  Substance Use Topics  . Alcohol use: No    Alcohol/week: 0.0 standard drinks  . Drug use: No     Allergies   Zithromax [azithromycin]   Review of Systems Review of Systems Pertinent negatives listed in HPI   Physical Exam Triage Vital Signs ED Triage Vitals  Enc Vitals Group     BP 12/17/19 1354 119/87     Pulse Rate 12/17/19 1354 81     Resp 12/17/19 1354 16     Temp 12/17/19 1354 98.6 F (37 C)     Temp Source 12/17/19 1354 Oral     SpO2 12/17/19 1354 97 %     Weight --      Height --      Head Circumference --      Peak Flow --      Pain Score 12/17/19 1352 8     Pain Loc --      Pain Edu? --      Excl. in [place]? --    No data found.  Updated Vital Signs BP 119/87 (BP Location: Right Arm)   Pulse 81   Temp 98.6 F (37 C) (Oral)   Resp 16   LMP 12/15/2019   SpO2 97%   Visual Acuity Right Eye Distance:   Left Eye Distance:   Bilateral Distance:    Right Eye Near:   Left Eye Near:    Bilateral Near:     Physical Exam Constitutional:      Appearance: Normal appearance.  Cardiovascular:     Rate and Rhythm: Normal rate and regular rhythm.  Pulmonary:     Effort: Pulmonary effort is normal.     Breath sounds: Normal breath sounds.  Musculoskeletal:     Cervical back: Normal range of motion.       Feet:  Neurological:     General: No focal deficit present.     Mental Status: She is alert.  Psychiatric:        Attention and Perception: Attention and perception normal.        Mood and Affect: Mood normal.        Behavior: Behavior normal.        Thought Content: Thought content normal.        Cognition and Memory: Cognition and memory normal.        Judgment: Judgment normal.    UC Treatments / Results  Labs (all labs ordered are listed, but only abnormal results are displayed) Labs Reviewed - No data to display  EKG   Radiology No results found.  Procedures Procedures (including critical care time)  Medications Ordered in  UC Medications - No  data to display  Initial Impression / Assessment and Plan / UC Course  I have reviewed the triage vital signs and the nursing notes.  Pertinent labs & imaging results that were available during my care of the patient were reviewed by me and considered in my medical decision making (see chart for details).    Infected great toe at nailbed. I&D not indicated. Recommended follow-up with podiatry as toe nail growth pattern likely will need to be removed. Doxycyline as prescribed. Monitor for signs of worsening infection. Final Clinical Impressions(s) / UC Diagnoses   Final diagnoses:  Infection of nail bed of toe of left foot   Discharge Instructions   None    ED Prescriptions    Medication Sig Dispense Auth. Provider   doxycycline (VIBRAMYCIN) 100 MG capsule Take 1 capsule (100 mg total) by mouth 2 (two) times daily. 20 capsule Scot Jun, FNP     PDMP not reviewed this encounter.   Scot Jun, [place] 12/22/19 2202

## 2019-12-23 ENCOUNTER — Other Ambulatory Visit: Payer: Self-pay

## 2019-12-23 ENCOUNTER — Encounter: Payer: Self-pay | Admitting: Podiatry

## 2019-12-23 ENCOUNTER — Ambulatory Visit (INDEPENDENT_AMBULATORY_CARE_PROVIDER_SITE_OTHER): Payer: Medicare Other | Admitting: Podiatry

## 2019-12-23 DIAGNOSIS — L600 Ingrowing nail: Secondary | ICD-10-CM | POA: Diagnosis not present

## 2019-12-23 NOTE — Patient Instructions (Signed)
Place 1/4 cup of epsom salts in a quart of warm tap water.  Submerge your foot or feet in the solution and soak for 20 minutes.  This soak should be done twice a day.  Next, remove your foot or feet from solution, blot dry the affected area. Apply ointment and cover if instructed by your doctor.   IF YOUR SKIN BECOMES IRRITATED WHILE USING THESE INSTRUCTIONS, IT IS OKAY TO SWITCH TO  WHITE VINEGAR AND WATER.  As another alternative soak, you may use antibacterial soap and water.  Monitor for any signs/symptoms of infection. Call the office immediately if any occur or go directly to the emergency room. Call with any questions/concerns.  Ingrown Toenail An ingrown toenail occurs when the corner or sides of a toenail grow into the surrounding skin. This causes discomfort and pain. The big toe is most commonly affected, but any of the toes can be affected. If an ingrown toenail is not treated, it can become infected. What are the causes? This condition may be caused by:  Wearing shoes that are too small or tight.  An injury, such as stubbing your toe or having your toe stepped on.  Improper cutting or care of your toenails.  Having nail or foot abnormalities that were present from birth (congenital abnormalities), such as having a nail that is too big for your toe. What increases the risk? The following factors may make you more likely to develop ingrown toenails:  Age. Nails tend to get thicker with age, so ingrown nails are more common among older people.  Cutting your toenails incorrectly, such as cutting them very short or cutting them unevenly. An ingrown toenail is more likely to get infected if you have:  Diabetes.  Blood flow (circulation) problems. What are the signs or symptoms? Symptoms of an ingrown toenail may include:  Pain, soreness, or tenderness.  Redness.  Swelling.  Hardening of the skin that surrounds the toenail. Signs that an ingrown toenail may be infected  include:  Fluid or pus.  Symptoms that get worse instead of better. How is this diagnosed? An ingrown toenail may be diagnosed based on your medical history, your symptoms, and a physical exam. If you have fluid or blood coming from your toenail, a sample may be collected to test for the specific type of bacteria that is causing the infection. How is this treated? Treatment depends on how severe your ingrown toenail is. You may be able to care for your toenail at home.  If you have an infection, you may be prescribed antibiotic medicines.  If you have fluid or pus draining from your toenail, your health care provider may drain it.  If you have trouble walking, you may be given crutches to use.  If you have a severe or infected ingrown toenail, you may need a procedure to remove part or all of the nail. Follow these instructions at home: Foot care   Do not pick at your toenail or try to remove it yourself.  Soak your foot in warm, soapy water. Do this for 20 minutes, 3 times a day, or as often as told by your health care provider. This helps to keep your toe clean and keep your skin soft.  Wear shoes that fit well and are not too tight. Your health care provider may recommend that you wear open-toed shoes while you heal.  Trim your toenails regularly and carefully. Cut your toenails straight across to prevent injury to the skin at the   corners of the toenail. Do not cut your nails in a curved shape.  Keep your feet clean and dry to help prevent infection. Medicines  Take over-the-counter and prescription medicines only as told by your health care provider.  If you were prescribed an antibiotic, take it as told by your health care provider. Do not stop taking the antibiotic even if you start to feel better. Activity  Return to your normal activities as told by your health care provider. Ask your health care provider what activities are safe for you.  Avoid activities that cause  pain. General instructions  If your health care provider told you to use crutches to help you move around, use them as instructed.  Keep all follow-up visits as told by your health care provider. This is important. Contact a health care provider if:  You have more redness, swelling, pain, or other symptoms that do not improve with treatment.  You have fluid, blood, or pus coming from your toenail. Get help right away if:  You have a red streak on your skin that starts at your foot and spreads up your leg.  You have a fever. Summary  An ingrown toenail occurs when the corner or sides of a toenail grow into the surrounding skin. This causes discomfort and pain. The big toe is most commonly affected, but any of the toes can be affected.  If an ingrown toenail is not treated, it can become infected.  Fluid or pus draining from your toenail is a sign of infection. Your health care provider may need to drain it. You may be given antibiotics to treat the infection.  Trimming your toenails regularly and properly can help you prevent an ingrown toenail. This information is not intended to replace advice given to you by your health care provider. Make sure you discuss any questions you have with your health care provider. Document Revised: 04/24/2018 Document Reviewed: 09/18/2016 Elsevier Patient Education  2020 [employer]  

## 2019-12-24 NOTE — Progress Notes (Signed)
Subjective:   Patient ID: Meghan Richardson, female   DOB: 39 y.o.   MRN: [id number]   HPI Patient states she has had chronic ingrown toenail on her right big toe and states that she has had to have it trimmed and cut out a number of times and only gets temporary relief and did have a previous infection which responded to antibiotic but she needs to have this fixed.  Patient does not smoke likes to be active   Review of Systems  All other systems reviewed and are negative.       Objective:  Physical Exam Vitals and nursing note reviewed.  Constitutional:      Appearance: She is well-developed and well-nourished.  Cardiovascular:     Pulses: Intact distal pulses.  Pulmonary:     Effort: Pulmonary effort is normal.  Musculoskeletal:        General: Normal range of motion.  Skin:    General: Skin is warm.  Neurological:     Mental Status: She is alert.     Neurovascular status intact muscle strength found to be adequate range of motion adequate.  Patient had previous kidney transplant but is done well as incurvated right hallux medial border painful when pressed with some thickening of the nail with probably history of trauma of this bed and slight looseness.  It is painful on the medial side and also somewhat on the dorsal surface but not to the same degree.  Good digital perfusion well oriented x3     Assessment:  Ingrown toenail deformity right hallux with possible traumatized damage with overall damage to the nailbed itself     Plan:  H&P reviewed condition discussed the overall structure of the nail and that we can try to work on the border but that it is possible someday the entire nail may need to be removed depending on pain or deformity.  She understands this and wants just the corner at this time and I allowed her to read and then signed consent form.  I remove the medial border after anesthetizing the toe 60 mg like Marcaine mixture and doing sterile prep.  Using sterile  instrumentation this was removed and the matrix excised on the medial side and I applied phenol 3 applications 30 seconds followed by alcohol lavage sterile dressing.  I gave instructions on soaks and to leave dressing on 24 hours but take it off earlier if any throbbing were to occur and encouraged her to call with questions concerns which may arise
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
## 2020-02-15 ENCOUNTER — Other Ambulatory Visit: Payer: Self-pay | Admitting: Obstetrics & Gynecology

## 2020-02-15 DIAGNOSIS — N946 Dysmenorrhea, unspecified: Secondary | ICD-10-CM

## 2020-02-21 ENCOUNTER — Other Ambulatory Visit: Payer: PRIVATE HEALTH INSURANCE

## 2020-03-07 ENCOUNTER — Ambulatory Visit
Admission: RE | Admit: 2020-03-07 | Discharge: 2020-03-07 | Disposition: A | Discharge status: Home / Self Care | Payer: Medicare Other | Source: Ambulatory Visit | Attending: Obstetrics & Gynecology | Admitting: Obstetrics & Gynecology

## 2020-03-07 ENCOUNTER — Other Ambulatory Visit: Payer: Self-pay

## 2020-03-07 DIAGNOSIS — N946 Dysmenorrhea, unspecified: Secondary | ICD-10-CM

## 2021-02-17 ENCOUNTER — Ambulatory Visit
Admission: RE | Admit: 2021-02-17 | Discharge: 2021-02-17 | Disposition: A | Discharge status: Home / Self Care | Payer: Medicare Other | Source: Ambulatory Visit

## 2021-02-17 ENCOUNTER — Telehealth: Payer: Self-pay | Admitting: Emergency Medicine

## 2021-02-17 ENCOUNTER — Other Ambulatory Visit: Payer: Self-pay

## 2021-02-17 VITALS — BP 162/110 | HR 82 | Temp 98.0°F | Resp 18

## 2021-02-17 DIAGNOSIS — N946 Dysmenorrhea, unspecified: Secondary | ICD-10-CM

## 2021-02-17 MED ORDER — HYDROCODONE-ACETAMINOPHEN 5-325 MG PO TABS
1.0000 | ORAL_TABLET | Freq: Four times a day (QID) | ORAL | 0 refills | Status: AC | PRN
Start: 2021-02-17 — End: 2021-02-20

## 2021-02-17 NOTE — ED Provider Notes (Signed)
[name]    CSN: [id number] Arrival date & time: 02/17/21  0855      History   Chief Complaint Chief Complaint  Patient presents with   Appointment    0900   Abdominal Cramping    HPI Meghan Richardson is a 41 y.o. female.   Patient is here today for menstrual cramping. She reports that cramping is severe at times, not with every period, but at times she will need hydrocodone to alleviate pain. She has been using OTC meds without relief. She has history of kidney transplant and follows with her transplant team and has also saw her GYN for symptoms. She has not had refill of hydrocodone since September and only uses as needed. She reports some associated nausea with pain but no vomiting. She reports her current cramping is the same as her typical menstrual cramps. She denies risk of pregnancy or miscarriage. She is currently experiencing vaginal bleeding.   The history is provided by the patient.  Abdominal Cramping Associated symptoms include abdominal pain.   Past Medical History:  Diagnosis Date   FSGS (focal segmental glomerulosclerosis)    Reports Kidney Bx at age 63   Hypertension     Patient Active Problem List   Diagnosis Date Noted   Peritonitis associated with peritoneal dialysis ([place]) 10/03/2014   Hypertension due to end stage renal disease on dialysis ([place]) 10/03/2014   Anemia in chronic kidney disease (CKD) 10/03/2014    Past Surgical History:  Procedure Laterality Date   AV FISTULA PLACEMENT     NEPHRECTOMY TRANSPLANTED ORGAN      OB History   No obstetric history on file.      Home Medications    Prior to Admission medications   Medication Sig Start Date End Date Taking? Authorizing Provider  aspirin EC 81 MG tablet Take 81 mg by mouth daily.    [provider]  benzonatate (TESSALON PERLES) 100 MG capsule Take 1 capsule (100 mg total) by mouth 3 (three) times daily as needed. Patient not taking: Reported on 02/17/2021 10/19/18    Chevis Pretty, FNP  cinacalcet (SENSIPAR) 30 MG tablet Take 30 mg by mouth daily. 07/20/19   [provider]  cyclobenzaprine (FLEXERIL) 5 MG tablet Take 1-2 tablets (5-10 mg total) by mouth 2 (two) times daily as needed for muscle spasms. 07/26/18   Wieters, Hallie C, PA-C  doxycycline (VIBRAMYCIN) 100 MG capsule Take 1 capsule (100 mg total) by mouth 2 (two) times daily. 12/17/19   Scot Jun, FNP  HYDROcodone-acetaminophen (NORCO/VICODIN) 5-325 MG tablet Take 1-2 tablets by mouth every 6 (six) hours as needed for up to 3 days for moderate pain. 02/17/21 02/20/21  Francene Finders, PA-C  ibuprofen (ADVIL) 600 MG tablet Take 600 mg by mouth every 6 (six) hours as needed for moderate pain.  09/12/19   [provider]  labetalol (NORMODYNE) 100 MG tablet Take 100 mg by mouth 2 (two) times daily. 05/20/19   [provider]  magnesium oxide (MAG-OX) 400 MG tablet Take 400 mg by mouth 2 (two) times daily.    [provider]  mycophenolate (MYFORTIC) 180 MG EC tablet Take 720 mg by mouth 2 (two) times daily. 07/20/19   [provider]  ondansetron (ZOFRAN ODT) 4 MG disintegrating tablet Take 1 tablet (4 mg total) by mouth every 8 (eight) hours as needed for nausea or vomiting. 10/27/19   Tedd Sias, PA  predniSONE (DELTASONE) 5 MG tablet Take 5  mg by mouth daily with breakfast.    [provider]  PROGRAF 5 MG capsule Take 5 mg by mouth 2 (two) times daily. 07/20/19   [provider]  sodium bicarbonate 650 MG tablet Take 1,300 mg by mouth 2 (two) times daily.  07/20/19   [provider]  tacrolimus (PROGRAF) 1 MG capsule Take 3 mg by mouth 2 (two) times daily.     [provider]    Family History Family History  Problem Relation Age of Onset   Kidney disease Brother        FSGS   Breast cancer Maternal Aunt    Breast cancer Maternal Grandmother     Social History Social History   Tobacco Use   Smoking  status: Former   Smokeless tobacco: Never  Substance Use Topics   Alcohol use: No    Alcohol/week: 0.0 standard drinks   Drug use: No     Allergies   Zithromax [azithromycin]   Review of Systems Review of Systems  Constitutional:  Negative for chills and fever.  Eyes:  Negative for discharge and redness.  Gastrointestinal:  Positive for abdominal pain and nausea. Negative for vomiting.  Genitourinary:  Positive for menstrual problem and vaginal bleeding.    Physical Exam Triage Vital Signs ED Triage Vitals [02/17/21 0929]  Enc Vitals Group     BP (!) 162/110     Pulse Rate 82     Resp 18     Temp 98 F (36.7 C)     Temp Source Oral     SpO2 100 %     Weight      Height      Head Circumference      Peak Flow      Pain Score 10     Pain Loc      Pain Edu?      Excl. in [place]?    No data found.  Updated Vital Signs BP (!) 162/110 (BP Location: Left Arm)    Pulse 82    Temp 98 F (36.7 C) (Oral)    Resp 18    SpO2 100%      Physical Exam Vitals and nursing note reviewed.  Constitutional:      General: She is not in acute distress.    Appearance: Normal appearance. She is not ill-appearing.  HENT:     Head: Normocephalic and atraumatic.  Eyes:     Conjunctiva/sclera: Conjunctivae normal.  Cardiovascular:     Rate and Rhythm: Normal rate and regular rhythm.     Pulses: Normal pulses.     Heart sounds: No murmur heard. Pulmonary:     Effort: Pulmonary effort is normal. No respiratory distress.     Breath sounds: Normal breath sounds. No wheezing, rhonchi or rales.  Abdominal:     General: Abdomen is flat. Bowel sounds are normal. There is no distension.     Tenderness: There is abdominal tenderness (diffuse abdominal TTP). There is no guarding.  Neurological:     Mental Status: She is alert.  Psychiatric:        Mood and Affect: Mood normal.        Behavior: Behavior normal.        Thought Content: Thought content normal.     UC Treatments / Results   Labs (all labs ordered are listed, but only abnormal results are displayed) Labs Reviewed - No data to display  EKG   Radiology No results found.  Procedures Procedures (including critical care time)  Medications Ordered in UC Medications - No data to display  Initial Impression / Assessment and Plan / UC Course  I have reviewed the triage vital signs and the nursing notes.  Pertinent labs & imaging results that were available during my care of the patient were reviewed by me and considered in my medical decision making (see chart for details).   Hydrocodone refilled at small quantity for pain relief if needed. Recommended she continue follow up with specialists. BP elevated in office but patient admits she has not taken her daily medication before office visit today. Encouraged follow up in office with any further concerns.    Final Clinical Impressions(s) / UC Diagnoses   Final diagnoses:  Menstrual pain   Discharge Instructions   None    ED Prescriptions     Medication Sig Dispense Auth. Provider   HYDROcodone-acetaminophen (NORCO/VICODIN) 5-325 MG tablet Take 1-2 tablets by mouth every 6 (six) hours as needed for up to 3 days for moderate pain. 20 tablet Francene Finders, PA-C      I have reviewed the PDMP during this encounter.   Francene Finders, PA-C 02/17/21 1039

## 2021-02-17 NOTE — ED Triage Notes (Signed)
Pt here for menstrual cramping that is per norm some months per pt; pt sts normally takes vicodin for pain but is almost out; pt sts hx of kidney transplant

## 2022-04-16 ENCOUNTER — Ambulatory Visit
Admission: RE | Admit: 2022-04-16 | Discharge: 2022-04-16 | Disposition: A | Discharge status: Home / Self Care | Payer: 59 | Source: Ambulatory Visit | Attending: Physician Assistant | Admitting: Physician Assistant

## 2022-04-16 VITALS — BP 145/100 | HR 75 | Temp 98.3°F | Resp 18

## 2022-04-16 DIAGNOSIS — M25511 Pain in right shoulder: Secondary | ICD-10-CM

## 2022-04-16 DIAGNOSIS — M542 Cervicalgia: Secondary | ICD-10-CM

## 2022-04-16 DIAGNOSIS — M7551 Bursitis of right shoulder: Secondary | ICD-10-CM

## 2022-04-16 MED ORDER — CYCLOBENZAPRINE HCL 5 MG PO TABS: 5.0000 mg | ORAL_TABLET | Freq: Three times a day (TID) | ORAL | 0 refills | Status: DC | PRN

## 2022-04-16 MED ORDER — HYDROCODONE-ACETAMINOPHEN 5-325 MG PO TABS: 1.0000 | ORAL_TABLET | ORAL | 0 refills | Status: DC | PRN

## 2022-04-16 NOTE — Discharge Instructions (Signed)
Advised to use ice therapy, 10 minutes on 20 minutes off, 3-4 times a day to help relieve pain and discomfort. Advised take the Flexeril 5 mg every 8 hours as needed for muscle spasm.  (This may cause drowsiness so use with caution) Advised take the Vicodin tablets 5/325, 1-2 every 6-8 hours as needed for acute pain relief  Advised if symptoms fail to improve within the next 3 days or worsen then please go to [facility] walk-in clinic for evaluation.  Please let them know that you are a kidney transplant recipient.  Return to urgent care as needed.

## 2022-04-16 NOTE — ED Triage Notes (Signed)
Pt is present today with c/o right shoulder pain radiating down her arm and fingers. Pt states that her pain started x5 days ago. Pt describes the pain  as sharp and tingling. Pt states that she has been taking OTC tylenol as needed for the pain. Pt states she did not take any pain medication today. Pt denies any injury

## 2022-04-16 NOTE — ED Provider Notes (Signed)
[name]    CSN: [id number] Arrival date & time: 04/16/22  0934      History   Chief Complaint Chief Complaint  Patient presents with   Arm Pain    HPI Meghan Richardson is a 42 y.o. female.   42 year old female presents with right shoulder and neck pain.  Patient indicates for the past 5 days she has been having persistent and progressive right shoulder pain, and right neck pain.  Patient indicates that there is been no injury to the neck of the shoulder.  She indicates that the pain started bothering her about a week or 2 ago but has progressed and is worse over the past 5 days that she indicates she is sore to touch on the top part of the shoulder and the upper part of the right arm.  She indicates that she gets pain when she raises the arm above 90 degrees and over the head.  She indicates she is getting some intermittent numbness and tingling down the right arm but there is no weakness.  She has been taking OTC medication without relief.  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  She believes that this activity is aggravated her shoulder and neck pain. Patient is a kidney transplant recipient 2019.   Arm Pain    Past Medical History:  Diagnosis Date   FSGS (focal segmental glomerulosclerosis)    Reports Kidney Bx at age 46   Hypertension     Patient Active Problem List   Diagnosis Date Noted   Peritonitis associated with peritoneal dialysis 10/03/2014   Hypertension due to end stage renal disease on dialysis 10/03/2014   Anemia in chronic kidney disease (CKD) 10/03/2014    Past Surgical History:  Procedure Laterality Date   AV FISTULA PLACEMENT     NEPHRECTOMY TRANSPLANTED ORGAN      OB History   No obstetric history on file.      Home Medications    Prior to Admission medications   Medication Sig Start Date End Date Taking? Authorizing Provider  cyclobenzaprine (FLEXERIL) 5 MG  tablet Take 1 tablet (5 mg total) by mouth 3 (three) times daily as needed for muscle spasms. 04/16/22  Yes Nyoka Lint, PA-C  HYDROcodone-acetaminophen (NORCO/VICODIN) 5-325 MG tablet Take 1-2 tablets by mouth every 4 (four) hours as needed. 04/16/22  Yes Nyoka Lint, PA-C  aspirin EC 81 MG tablet Take 81 mg by mouth daily.    [provider]  benzonatate (TESSALON PERLES) 100 MG capsule Take 1 capsule (100 mg total) by mouth 3 (three) times daily as needed. Patient not taking: Reported on 02/17/2021 10/19/18   Chevis Pretty, FNP  cinacalcet (SENSIPAR) 30 MG tablet Take 30 mg by mouth daily. 07/20/19   [provider]  cyclobenzaprine (FLEXERIL) 5 MG tablet Take 1-2 tablets (5-10 mg total) by mouth 2 (two) times daily as needed for muscle spasms. 07/26/18   Wieters, Hallie C, PA-C  doxycycline (VIBRAMYCIN) 100 MG capsule Take 1 capsule (100 mg total) by mouth 2 (two) times daily. 12/17/19   Scot Jun, NP  ibuprofen (ADVIL) 600 MG tablet Take 600 mg by mouth every 6 (six) hours as needed for moderate pain.  09/12/19   [provider]  labetalol (NORMODYNE) 100 MG tablet Take 100 mg by mouth 2 (two) times daily. 05/20/19   [provider]  magnesium oxide (MAG-OX) 400 MG tablet  Take 400 mg by mouth 2 (two) times daily.    [provider]  mycophenolate (MYFORTIC) 180 MG EC tablet Take 720 mg by mouth 2 (two) times daily. 07/20/19   [provider]  ondansetron (ZOFRAN ODT) 4 MG disintegrating tablet Take 1 tablet (4 mg total) by mouth every 8 (eight) hours as needed for nausea or vomiting. 10/27/19   Tedd Sias, PA  predniSONE (DELTASONE) 5 MG tablet Take 5 mg by mouth daily with breakfast.    [provider]  PROGRAF 5 MG capsule Take 5 mg by mouth 2 (two) times daily. 07/20/19   [provider]  sodium bicarbonate 650 MG tablet Take 1,300 mg by mouth 2 (two) times daily.  07/20/19   [provider]  tacrolimus  (PROGRAF) 1 MG capsule Take 3 mg by mouth 2 (two) times daily.     [provider]    Family History Family History  Problem Relation Age of Onset   Kidney disease Brother        FSGS   Breast cancer Maternal Aunt    Breast cancer Maternal Grandmother     Social History Social History   Tobacco Use   Smoking status: Former   Smokeless tobacco: Never  Substance Use Topics   Alcohol use: No    Alcohol/week: 0.0 standard drinks of alcohol   Drug use: No     Allergies   Zithromax [azithromycin]   Review of Systems Review of Systems  Musculoskeletal:  Positive for arthralgias (right shoulder and neck pain).     Physical Exam Triage Vital Signs ED Triage Vitals  Enc Vitals Group     BP 04/16/22 0953 (!) 145/100     Pulse Rate 04/16/22 0953 75     Resp 04/16/22 0953 18     Temp 04/16/22 0953 98.3 F (36.8 C)     Temp src --      SpO2 04/16/22 0953 98 %     Weight --      Height --      Head Circumference --      Peak Flow --      Pain Score 04/16/22 0951 10     Pain Loc --      Pain Edu? --      Excl. in [place]? --    No data found.  Updated Vital Signs BP (!) 145/100   Pulse 75   Temp 98.3 F (36.8 C)   Resp 18   SpO2 98%   Visual Acuity Right Eye Distance:   Left Eye Distance:   Bilateral Distance:    Right Eye Near:   Left Eye Near:    Bilateral Near:     Physical Exam Constitutional:      Appearance: Normal appearance.  Musculoskeletal:       Arms:     Comments: Pain is palpated along the right lateral trapezius and posterior neck area without any unusual swelling or redness, full range of motion is normal with mild pain on turning to the left and right.  Right shoulder: Pain is palpated along the trapezius area and the deltoid without any unusual swelling.  Range of motion is normal with pain on 90 degree abduction and overhead reaching, strength is intact.  Range of motion is normal without crepitus present.  Neurological:      Mental Status: She is alert.      UC Treatments / Results  Labs (all labs ordered are listed, but  only abnormal results are displayed) Labs Reviewed - No data to display  EKG   Radiology No results found.  Procedures Procedures (including critical care time)  Medications Ordered in UC Medications - No data to display  Initial Impression / Assessment and Plan / UC Course  I have reviewed the triage vital signs and the nursing notes.  Pertinent labs & imaging results that were available during my care of the patient were reviewed by me and considered in my medical decision making (see chart for details).    Plan: The diagnosis will be treated with the following: 1.  Acute pain of right shoulder: A.  Flexeril 5 mg every 8 hours as needed for muscle spasm. B.  Vicodin tablets 5/325, 1-2 every 8 hours as needed for pain. 2.  Neck pain: A.  Flexeril 5 mg every 8 hours as needed for muscle spasm. 3.  Acute bursitis right shoulder: A.  Ice therapy, 10 minutes on 20 minutes off, 3-4 times throughout the day to help relieve pain and spasm. B.  Vicodin tablets, 1-2 every 8 hours as needed for pain. 4.  Patient is advised that if pain does not improve over the next 48 to 72 hours or worsens she is to consult [facility] at their walk-in [facility] clinic for evaluation and modify treatment. 5.  Return to urgent care as needed.  Final Clinical Impressions(s) / UC Diagnoses   Final diagnoses:  Acute pain of right shoulder  Neck pain  Acute bursitis of right shoulder     Discharge Instructions      Advised to use ice therapy, 10 minutes on 20 minutes off, 3-4 times a day to help relieve pain and discomfort. Advised take the Flexeril 5 mg every 8 hours as needed for muscle spasm.  (This may cause drowsiness so use with caution) Advised take the Vicodin tablets 5/325, 1-2 every 6-8 hours as needed for acute pain relief  Advised if symptoms fail to improve within the next 3  days or worsen then please go to [facility] walk-in clinic for evaluation.  Please let them know that you are a kidney transplant recipient.  Return to urgent care as needed.    ED Prescriptions     Medication Sig Dispense Auth. Provider   HYDROcodone-acetaminophen (NORCO/VICODIN) 5-325 MG tablet Take 1-2 tablets by mouth every 4 (four) hours as needed. 18 tablet Nyoka Lint, PA-C   cyclobenzaprine (FLEXERIL) 5 MG tablet Take 1 tablet (5 mg total) by mouth 3 (three) times daily as needed for muscle spasms. 30 tablet Nyoka Lint, PA-C      I have reviewed the PDMP during this encounter.   Nyoka Lint, PA-C 04/16/22 1045

## 2022-10-10 ENCOUNTER — Encounter (HOSPITAL_COMMUNITY): Payer: Self-pay

## 2022-10-10 ENCOUNTER — Ambulatory Visit (HOSPITAL_COMMUNITY)
Admission: EM | Admit: 2022-10-10 | Discharge: 2022-10-10 | Disposition: A | Discharge status: Home / Self Care | Payer: 59 | Attending: Family Medicine | Admitting: Family Medicine

## 2022-10-10 DIAGNOSIS — B349 Viral infection, unspecified: Secondary | ICD-10-CM | POA: Diagnosis not present

## 2022-10-10 DIAGNOSIS — R6889 Other general symptoms and signs: Secondary | ICD-10-CM | POA: Insufficient documentation

## 2022-10-10 DIAGNOSIS — R509 Fever, unspecified: Secondary | ICD-10-CM | POA: Diagnosis not present

## 2022-10-10 DIAGNOSIS — Z940 Kidney transplant status: Secondary | ICD-10-CM | POA: Diagnosis not present

## 2022-10-10 DIAGNOSIS — N186 End stage renal disease: Secondary | ICD-10-CM | POA: Diagnosis not present

## 2022-10-10 DIAGNOSIS — D849 Immunodeficiency, unspecified: Secondary | ICD-10-CM | POA: Diagnosis not present

## 2022-10-10 DIAGNOSIS — U071 COVID-19: Secondary | ICD-10-CM | POA: Insufficient documentation

## 2022-10-10 LAB — POCT URINALYSIS DIP (MANUAL ENTRY)
Bilirubin, UA: NEGATIVE
Glucose, UA: NEGATIVE mg/dL
Ketones, POC UA: NEGATIVE mg/dL
Leukocytes, UA: NEGATIVE
Nitrite, UA: NEGATIVE
Protein Ur, POC: NEGATIVE mg/dL
Spec Grav, UA: 1.02 (ref 1.010–1.025)
Urobilinogen, UA: 0.2 E.U./dL
pH, UA: 8.5 — AB (ref 5.0–8.0)

## 2022-10-10 LAB — POCT INFLUENZA A/B
Influenza A, POC: NEGATIVE
Influenza B, POC: NEGATIVE

## 2022-10-10 LAB — POCT URINE PREGNANCY: Preg Test, Ur: NEGATIVE

## 2022-10-10 MED ORDER — ACETAMINOPHEN 325 MG PO TABS
650.0000 mg | ORAL_TABLET | Freq: Once | ORAL | Status: AC
  Administered 2022-10-10: 650 mg via ORAL

## 2022-10-10 MED ORDER — ACETAMINOPHEN 325 MG PO TABS
ORAL_TABLET | ORAL | Status: AC
  Filled 2022-10-10: qty 2

## 2022-10-10 NOTE — Discharge Instructions (Addendum)
Your COVID test will result to your MyChart within 24 hours.  If your COVID test is negative and your flu test here is negative recommend going immediately to the emergency department for further workup and evaluation of your symptoms given that you are transplant patient and you are currently febrile.  If at any point your symptoms worsen go immediately to the emergency department regardless of your test results. Take Tylenol as needed for fever.

## 2022-10-10 NOTE — ED Provider Notes (Signed)
[facility]    CSN: [id number] Arrival date & time: 10/10/22  1912      History   Chief Complaint Chief Complaint  Patient presents with   Headache   Generalized Body Aches   Flank Pain    HPI Kehinde Totzke is a 42 y.o. female with a medical history significant for ESRD with right kidney transplant recipient 2019 , hypertension, and anemia. Patient presents with acute onset of cough, moderate to severe headache (generalized), nasal congestion, generalized body aches and fever TMAX 102 x 1 day ago.  She endorses generalized bodyaches however was concerned she may have a kidney infection as she is endorsing some right-sided flank pain.  She reports the pain is deep and is not reproducible with touching or pressing.  She denies any symptoms of dysuria.  Patient is febrile on arrival.  She has not taken any medication today.  She is not able to take any ibuprofen due to being a kidney transplant recipient.  She reports the cough has resolved however her headache has seemingly worsened over the course of today.  She is unaware of any known sick contacts.  She is immunocompromise as she is chronically prescribed medications to prevent organ rejection due to being a transplant recipient.  Patient endorses some nausea although denies any vomiting endorses poor appetite.   Past Medical History:  Diagnosis Date   FSGS (focal segmental glomerulosclerosis)    Reports Kidney Bx at age 27   Hypertension     Patient Active Problem List   Diagnosis Date Noted   Peritonitis associated with peritoneal dialysis (HCC) 10/03/2014   Hypertension due to end stage renal disease on dialysis (HCC) 10/03/2014   Anemia in chronic kidney disease (CKD) 10/03/2014    Past Surgical History:  Procedure Laterality Date   AV FISTULA PLACEMENT     NEPHRECTOMY TRANSPLANTED ORGAN      OB History   No obstetric history on file.      Home Medications    Prior to Admission medications    Medication Sig Start Date End Date Taking? Authorizing Provider  aspirin EC 81 MG tablet Take 81 mg by mouth daily.    [provider]  benzonatate (TESSALON PERLES) 100 MG capsule Take 1 capsule (100 mg total) by mouth 3 (three) times daily as needed. Patient not taking: Reported on 02/17/2021 10/19/18   Bennie Pierini, FNP  cinacalcet (SENSIPAR) 30 MG tablet Take 30 mg by mouth daily. 07/20/19   [provider]  cyclobenzaprine (FLEXERIL) 5 MG tablet Take 1-2 tablets (5-10 mg total) by mouth 2 (two) times daily as needed for muscle spasms. 07/26/18   Wieters, Hallie C, PA-C  cyclobenzaprine (FLEXERIL) 5 MG tablet Take 1 tablet (5 mg total) by mouth 3 (three) times daily as needed for muscle spasms. 04/16/22   Ellsworth Lennox, PA-C  doxycycline (VIBRAMYCIN) 100 MG capsule Take 1 capsule (100 mg total) by mouth 2 (two) times daily. 12/17/19   Bing Neighbors, NP  HYDROcodone-acetaminophen (NORCO/VICODIN) 5-325 MG tablet Take 1-2 tablets by mouth every 4 (four) hours as needed. 04/16/22   Ellsworth Lennox, PA-C  ibuprofen (ADVIL) 600 MG tablet Take 600 mg by mouth every 6 (six) hours as needed for moderate pain.  09/12/19   [provider]  labetalol (NORMODYNE) 100 MG tablet Take 100 mg by mouth 2 (two) times daily. 05/20/19   [provider]  magnesium oxide (MAG-OX) 400 MG tablet Take 400 mg by mouth 2 (two) times  daily.    [provider]  mycophenolate (MYFORTIC) 180 MG EC tablet Take 720 mg by mouth 2 (two) times daily. 07/20/19   [provider]  ondansetron (ZOFRAN ODT) 4 MG disintegrating tablet Take 1 tablet (4 mg total) by mouth every 8 (eight) hours as needed for nausea or vomiting. 10/27/19   Gailen Shelter, PA  predniSONE (DELTASONE) 5 MG tablet Take 5 mg by mouth daily with breakfast.    [provider]  PROGRAF 5 MG capsule Take 5 mg by mouth 2 (two) times daily. 07/20/19   [provider]  sodium bicarbonate 650 MG tablet  Take 1,300 mg by mouth 2 (two) times daily.  07/20/19   [provider]  tacrolimus (PROGRAF) 1 MG capsule Take 3 mg by mouth 2 (two) times daily.     [provider]    Family History Family History  Problem Relation Age of Onset   Kidney disease Brother        FSGS   Breast cancer Maternal Grandmother    Breast cancer Maternal Aunt     Social History Social History   Tobacco Use   Smoking status: Former   Smokeless tobacco: Never  Advertising account planner   Vaping status: Never Used  Substance Use Topics   Alcohol use: No    Alcohol/week: 0.0 standard drinks of alcohol   Drug use: No     Allergies   Zithromax [azithromycin]   Review of Systems Review of Systems  Constitutional:  Positive for appetite change, chills and fever.  HENT:  Positive for congestion.   Respiratory:  Positive for cough.   Musculoskeletal:  Positive for arthralgias and myalgias.  Allergic/Immunologic: Positive for immunocompromised state.  Neurological:  Positive for headaches.     Physical Exam Triage Vital Signs ED Triage Vitals  Encounter Vitals Group     BP 10/10/22 1941 113/78     Systolic BP Percentile --      Diastolic BP Percentile --      Pulse Rate 10/10/22 1941 91     Resp 10/10/22 1941 16     Temp 10/10/22 1939 (!) 101.4 F (38.6 C)     Temp Source 10/10/22 1939 Oral     SpO2 10/10/22 1941 97 %     Weight --      Height --      Head Circumference --      Peak Flow --      Pain Score 10/10/22 1942 10     Pain Loc --      Pain Education --      Exclude from Growth Chart --    No data found.  Updated Vital Signs BP 113/78 (BP Location: Right Arm)   Pulse 91   Temp (!) 101.4 F (38.6 C) (Oral)   Resp 16   LMP 10/10/2022   SpO2 97%   Visual Acuity Right Eye Distance:   Left Eye Distance:   Bilateral Distance:    Right Eye Near:   Left Eye Near:    Bilateral Near:     Physical Exam Constitutional:      Appearance: She is ill-appearing. She is not  toxic-appearing.  HENT:     Head: Normocephalic and atraumatic.     Nose: Congestion present.  Eyes:     Extraocular Movements: Extraocular movements intact.     Pupils: Pupils are equal, round, and reactive to light.  Cardiovascular:     Rate and Rhythm: Normal rate  and regular rhythm.  Pulmonary:     Effort: Pulmonary effort is normal.     Breath sounds: Normal breath sounds.  Musculoskeletal:        General: Normal range of motion.     Cervical back: Normal range of motion and neck supple.  Lymphadenopathy:     Cervical: Cervical adenopathy present.  Skin:    General: Skin is warm.  Neurological:     Mental Status: She is alert and oriented to person, place, and time.     GCS: GCS eye subscore is 4. GCS verbal subscore is 5. GCS motor subscore is 6.      UC Treatments / Results  Labs (all labs ordered are listed, but only abnormal results are displayed) Labs Reviewed  SARS CORONAVIRUS 2 (TAT 6-24 HRS) - Abnormal; Notable for the following components:      Result Value   SARS Coronavirus 2 POSITIVE (*)    All other components within normal limits  POCT URINALYSIS DIP (MANUAL ENTRY) - Abnormal; Notable for the following components:   Clarity, UA cloudy (*)    Blood, UA large (*)    pH, UA 8.5 (*)    All other components within normal limits  POCT URINE PREGNANCY  POCT INFLUENZA A/B    EKG   Radiology No results found.  Procedures Procedures (including critical care time)  Medications Ordered in UC Medications  acetaminophen (TYLENOL) tablet 650 mg (650 mg Oral Given 10/10/22 2019)    Initial Impression / Assessment and Plan / UC Course  I have reviewed the triage vital signs and the nursing notes.  Pertinent labs & imaging results that were available during my care of the patient were reviewed by me and considered in my medical decision making (see chart for details).     Patient is immunocompromise secondary to renal transplant and chronically  prescribed immunosuppressant for antiorgan rejection therapy presents with a fever of 101.4.  Although patient appears generally ill she does not appear toxic.  UA unremarkable for a UTI.  Patient is experiencing some right-sided pain although is not flank pain and is not reproducible on exam.  Obtained of rapid influenza AMB test which was negative.  Will collect a COVID test.  Patient is advised that if any of her symptoms worsen, including her headache or for fever does not improve with Tylenol and or if her COVID test is negative then I would advise her strongly to go to the emergency department to ensure that she is not septic.  We discussed all of the red flag symptoms that would warrant emergent evaluation in the emergency department.  Given the patient's medical history she would benefit from antiviral therapy if her COVID test is positive-recommend  Paxlovid with prescribed renal impairment dose however if there is medication contraindications with chronic medication, patient may benefit from molnupiravir. Final Clinical Impressions(s) / UC Diagnoses   Final diagnoses:  Flu-like symptoms  Fever, unspecified  Immunocompromised (HCC)  Renal transplant recipient  Viral illness     Discharge Instructions      Your COVID test will result to your MyChart within 24 hours.  If your COVID test is negative and your flu test here is negative recommend going immediately to the emergency department for further workup and evaluation of your symptoms given that you are transplant patient and you are currently febrile.  If at any point your symptoms worsen go immediately to the emergency department regardless of your test results. Take Tylenol as  needed for fever.     ED Prescriptions   None    PDMP not reviewed this encounter.   Bing Neighbors, NP 10/11/22 [phone]

## 2022-10-10 NOTE — ED Triage Notes (Signed)
Patient c/o right flank pain, headache, and generalized body aches since yesterday. Patient reports that she had a right kidney transplant in 2019.  Patient also reports that she had a negative at home Covid test today. Patient states she had a temperature at home of 102.0  Patient states she has not been drinking much today and did not take any medication for her temperature.

## 2022-10-11 ENCOUNTER — Telehealth: Payer: Self-pay

## 2022-10-11 LAB — SARS CORONAVIRUS 2 (TAT 6-24 HRS): SARS Coronavirus 2: POSITIVE — AB

## 2022-10-11 MED ORDER — PAXLOVID (150/100) 10 X 150 MG & 10 X 100MG PO TBPK: 2.0000 | ORAL_TABLET | Freq: Two times a day (BID) | ORAL | 0 refills | Status: AC

## 2022-10-11 NOTE — Telephone Encounter (Signed)
Per VO by L. Lequita Halt,  PA-C, "I recommend the renal dose of paxlovid. GFR is 47. Nirmatrelvir 150 mg and ritonavir 100 mg, administered together, twice daily for 5 days." Reviewed with patient, verified pharmacy, prescription sent.

## 2022-10-12 ENCOUNTER — Emergency Department
Admission: EM | Admit: 2022-10-12 | Discharge: 2022-10-12 | Disposition: A | Discharge status: Home / Self Care | Payer: 59 | Attending: Emergency Medicine | Admitting: Emergency Medicine

## 2022-10-12 ENCOUNTER — Other Ambulatory Visit: Payer: Self-pay

## 2022-10-12 DIAGNOSIS — U071 COVID-19: Secondary | ICD-10-CM | POA: Diagnosis not present

## 2022-10-12 DIAGNOSIS — Z940 Kidney transplant status: Secondary | ICD-10-CM | POA: Insufficient documentation

## 2022-10-12 DIAGNOSIS — E860 Dehydration: Secondary | ICD-10-CM | POA: Insufficient documentation

## 2022-10-12 LAB — COMPREHENSIVE METABOLIC PANEL
ALT: 10 U/L (ref 0–44)
AST: 11 U/L — ABNORMAL LOW (ref 15–41)
Albumin: 3.9 g/dL (ref 3.5–5.0)
Alkaline Phosphatase: 74 U/L (ref 38–126)
Anion gap: 13 (ref 5–15)
BUN: 14 mg/dL (ref 6–20)
CO2: 22 mmol/L (ref 22–32)
Calcium: 9.2 mg/dL (ref 8.9–10.3)
Chloride: 102 mmol/L (ref 98–111)
Creatinine, Ser: 1.43 mg/dL — ABNORMAL HIGH (ref 0.44–1.00)
GFR, Estimated: 48 mL/min — ABNORMAL LOW (ref >60)
Glucose, Bld: 127 mg/dL — ABNORMAL HIGH (ref 70–99)
Potassium: 3.9 mmol/L (ref 3.5–5.1)
Sodium: 137 mmol/L (ref 135–145)
Total Bilirubin: 0.6 mg/dL (ref 0.3–1.2)
Total Protein: 7.9 g/dL (ref 6.5–8.1)

## 2022-10-12 LAB — CBC WITH DIFFERENTIAL/PLATELET
Abs Immature Granulocytes: 0.03 10*3/uL (ref 0.00–0.07)
Basophils Absolute: 0 10*3/uL (ref 0.0–0.1)
Basophils Relative: 0 %
Eosinophils Absolute: 0 10*3/uL (ref 0.0–0.5)
Eosinophils Relative: 0 %
HCT: 41 % (ref 36.0–46.0)
Hemoglobin: 14.1 g/dL (ref 12.0–15.0)
Immature Granulocytes: 0 %
Lymphocytes Relative: 20 %
Lymphs Abs: 1.8 10*3/uL (ref 0.7–4.0)
MCH: 25.5 pg — ABNORMAL LOW (ref 26.0–34.0)
MCHC: 34.4 g/dL (ref 30.0–36.0)
MCV: 74.3 fL — ABNORMAL LOW (ref 80.0–100.0)
Monocytes Absolute: 0.5 10*3/uL (ref 0.1–1.0)
Monocytes Relative: 6 %
Neutro Abs: 6.8 10*3/uL (ref 1.7–7.7)
Neutrophils Relative %: 74 %
Platelets: 305 10*3/uL (ref 150–400)
RBC: 5.52 MIL/uL — ABNORMAL HIGH (ref 3.87–5.11)
RDW: 14 % (ref 11.5–15.5)
WBC: 9.2 10*3/uL (ref 4.0–10.5)
nRBC: 0 % (ref 0.0–0.2)

## 2022-10-12 MED ORDER — SODIUM CHLORIDE 0.9 % IV BOLUS
1000.0000 mL | Freq: Once | INTRAVENOUS | Status: AC
  Administered 2022-10-12: 1000 mL via INTRAVENOUS

## 2022-10-12 MED ORDER — PROCHLORPERAZINE EDISYLATE 10 MG/2ML IJ SOLN
10.0000 mg | Freq: Once | INTRAMUSCULAR | Status: AC
  Administered 2022-10-12: 10 mg via INTRAVENOUS
  Filled 2022-10-12: qty 2

## 2022-10-12 NOTE — ED Provider Notes (Signed)
[facility] Provider Note    Event Date/Time   First MD Initiated Contact with Patient 10/12/22 1913     (approximate)   History   dehydration   HPI  Meghan Richardson is a 42 y.o. female who presents to the emergency department today with concerns for dehydration.  The patient was diagnosed with COVID 2 days ago.  Has been having symptoms for roughly 5 days.  She states she has had decreased oral intake.  In addition she has been having headaches.  Patient does have history of kidney transplant.      Physical Exam   Triage Vital Signs: ED Triage Vitals  Encounter Vitals Group     BP 10/12/22 1857 (!) 131/94     Systolic BP Percentile --      Diastolic BP Percentile --      Pulse Rate 10/12/22 1857 85     Resp 10/12/22 1857 18     Temp 10/12/22 1857 98.5 F (36.9 C)     Temp Source 10/12/22 1857 Oral     SpO2 10/12/22 1857 100 %     Weight --      Height --      Head Circumference --      Peak Flow --      Pain Score 10/12/22 1858 7     Pain Loc --      Pain Education --      Exclude from Growth Chart --     Most recent vital signs: Vitals:   10/12/22 1857 10/12/22 1950  BP: (!) 131/94 113/85  Pulse: 85 88  Resp: 18   Temp: 98.5 F (36.9 C)   SpO2: 100% 97%   General: Awake, alert, oriented. CV:  Good peripheral perfusion. Regular rate and rhythm. Resp:  Normal effort. Lungs clear. Abd:  No distention.    ED Results / Procedures / Treatments   Labs (all labs ordered are listed, but only abnormal results are displayed) Labs Reviewed  CBC WITH DIFFERENTIAL/PLATELET - Abnormal; Notable for the following components:      Result Value   RBC 5.52 (*)    MCV 74.3 (*)    MCH 25.5 (*)    All other components within normal limits  COMPREHENSIVE METABOLIC PANEL - Abnormal; Notable for the following components:   Glucose, Bld 127 (*)    Creatinine, Ser 1.43 (*)    AST 11 (*)    GFR, Estimated 48 (*)    All other components within  normal limits     EKG  None   RADIOLOGY None   PROCEDURES:  Critical Care performed: No    MEDICATIONS ORDERED IN ED: Medications  sodium chloride 0.9 % bolus 1,000 mL (has no administration in time range)  prochlorperazine (COMPAZINE) injection 10 mg (has no administration in time range)     IMPRESSION / MDM / ASSESSMENT AND PLAN / ED COURSE  I reviewed the triage vital signs and the nursing notes.                              Differential diagnosis includes, but is not limited to, dehydration, COVID  Patient's presentation is most consistent with acute presentation with potential threat to life or bodily function.   The patient is on the cardiac monitor to evaluate for evidence of arrhythmia and/or significant heart rate changes.  Patient presented to the emergency department today because of concerns  for dehydration and headache and setting of COVID diagnosis.  Patient is neither tachycardic nor hypotensive.  Blood work without significant change in baseline creatinine.  Will give patient fluids and medication to help with headache.  Patient did feel better after IV fluids and medication. Will plan on discharging.       FINAL CLINICAL IMPRESSION(S) / ED DIAGNOSES   Final diagnoses:  COVID-19      Note:  This document was prepared using Dragon voice recognition software and may include unintentional dictation errors.    Phineas Semen, MD 10/12/22 [phone]

## 2022-10-12 NOTE — ED Triage Notes (Signed)
Pt tested positive for covid x3 days and is concerned for dehydration, states she only urinated a small amount once today and has a kidney transplant. Pt is AOX4, no acute distress noted. Pt taking antivirals. Pt c/o malaise, headache and nasal congestion only.

## 2022-10-12 NOTE — Discharge Instructions (Signed)
Please seek medical attention for any high fevers, chest pain, shortness of breath, change in behavior, persistent vomiting, bloody stool or any other new or concerning symptoms.  

## 2022-11-04 ENCOUNTER — Other Ambulatory Visit: Payer: Self-pay | Admitting: Internal Medicine

## 2022-11-04 DIAGNOSIS — Z1231 Encounter for screening mammogram for malignant neoplasm of breast: Secondary | ICD-10-CM

## 2022-12-03 ENCOUNTER — Ambulatory Visit: Payer: 59

## 2022-12-21 ENCOUNTER — Encounter (HOSPITAL_COMMUNITY): Payer: Self-pay

## 2022-12-21 ENCOUNTER — Inpatient Hospital Stay (HOSPITAL_COMMUNITY)
Admission: RE | Admit: 2022-12-21 | Discharge: 2022-12-21 | Discharge status: Home / Self Care | Payer: 59 | Source: Ambulatory Visit | Attending: Nurse Practitioner

## 2022-12-21 VITALS — BP 115/79 | HR 74 | Temp 97.6°F | Resp 16

## 2022-12-21 DIAGNOSIS — M542 Cervicalgia: Secondary | ICD-10-CM

## 2022-12-21 DIAGNOSIS — M25511 Pain in right shoulder: Secondary | ICD-10-CM | POA: Diagnosis not present

## 2022-12-21 MED ORDER — TIZANIDINE HCL 4 MG PO TABS
4.0000 mg | ORAL_TABLET | Freq: Three times a day (TID) | ORAL | 0 refills | Status: DC | PRN
Start: 2022-12-21 — End: 2023-07-14

## 2022-12-21 MED ORDER — KETOROLAC TROMETHAMINE 30 MG/ML IJ SOLN
INTRAMUSCULAR | Status: AC
  Filled 2022-12-21: qty 1

## 2022-12-21 MED ORDER — KETOROLAC TROMETHAMINE 30 MG/ML IJ SOLN
15.0000 mg | Freq: Once | INTRAMUSCULAR | Status: AC
  Administered 2022-12-21: 15 mg via INTRAMUSCULAR

## 2022-12-21 NOTE — Discharge Instructions (Addendum)
I believe you have strained a muscle in your shoulder and/or neck.  We have given you an injection of Toradol today which is an anti-inflammatory medicine.  Please stop taking the ibuprofen.  You can take the tizanidine every 8 hours as needed for muscular pain.  Recommend light range of motion and stretching exercises.  You can apply ice or heat to the area to help with pain.  Seek care if symptoms do not improve with treatment.  Recommend follow-up with sports medicine.  Contact information has been provided.

## 2022-12-21 NOTE — ED Triage Notes (Signed)
Pt c/o right arm, shoulder and neck pain for 4 days. Denies any injury or lifting. Pt reports took Ibuprofen but none today.

## 2022-12-21 NOTE — ED Provider Notes (Signed)
[facility]    CSN: [id number] Arrival date & time: 12/21/22  1538      History   Chief Complaint Chief Complaint  Patient presents with   Shoulder Pain    Entered by patient   Neck Pain    HPI Meghan Richardson is a 42 y.o. female.   Patient presents today for 4 day history of right shoulder and midline neck pain.  Denies recent fall, trauma, or known injury to the neck or shoulder.  No decreased range of motion to the shoulder or neck.  Has been taking ibuprofen 600 mg for the pain which helps only temporarily.  Denies pain radiating to the fingertips of the right hand.  Does have some radiation of pain down the shoulder to the arm but not to the fingertips.  No upper extremity weakness or decreased sensation.    Medical significant for status post kidney transplant.    Past Medical History:  Diagnosis Date   FSGS (focal segmental glomerulosclerosis)    Reports Kidney Bx at age 30   Hypertension     Patient Active Problem List   Diagnosis Date Noted   Peritonitis associated with peritoneal dialysis (HCC) 10/03/2014   Hypertension due to end stage renal disease on dialysis (HCC) 10/03/2014   Anemia in chronic kidney disease (CKD) 10/03/2014    Past Surgical History:  Procedure Laterality Date   AV FISTULA PLACEMENT     NEPHRECTOMY TRANSPLANTED ORGAN      OB History   No obstetric history on file.      Home Medications    Prior to Admission medications   Medication Sig Start Date End Date Taking? Authorizing Provider  tiZANidine (ZANAFLEX) 4 MG tablet Take 1 tablet (4 mg total) by mouth every 8 (eight) hours as needed for muscle spasms. Do not take with alcohol or while driving or operating heavy machinery.  May cause drowsiness. 12/21/22  Yes Valentino Nose, NP  aspirin EC 81 MG tablet Take 81 mg by mouth daily.    [provider]  cinacalcet (SENSIPAR) 30 MG tablet Take 30 mg by mouth daily. 07/20/19   [provider]  ibuprofen  (ADVIL) 600 MG tablet Take 600 mg by mouth every 6 (six) hours as needed for moderate pain.  09/12/19   [provider]  labetalol (NORMODYNE) 100 MG tablet Take 100 mg by mouth 2 (two) times daily. 05/20/19   [provider]  magnesium oxide (MAG-OX) 400 MG tablet Take 400 mg by mouth 2 (two) times daily.    [provider]  mycophenolate (MYFORTIC) 180 MG EC tablet Take 720 mg by mouth 2 (two) times daily. 07/20/19   [provider]  ondansetron (ZOFRAN ODT) 4 MG disintegrating tablet Take 1 tablet (4 mg total) by mouth every 8 (eight) hours as needed for nausea or vomiting. 10/27/19   Gailen Shelter, PA  predniSONE (DELTASONE) 5 MG tablet Take 5 mg by mouth daily with breakfast.    [provider]  PROGRAF 5 MG capsule Take 5 mg by mouth 2 (two) times daily. 07/20/19   [provider]  sodium bicarbonate 650 MG tablet Take 1,300 mg by mouth 2 (two) times daily.  07/20/19   [provider]  tacrolimus (PROGRAF) 1 MG capsule Take 3 mg by mouth 2 (two) times daily.     [provider]    Family History Family History  Problem Relation Age of Onset   Kidney disease Brother  FSGS   Breast cancer Maternal Grandmother    Breast cancer Maternal Aunt     Social History Social History   Tobacco Use   Smoking status: Former   Smokeless tobacco: Never  Vaping Use   Vaping status: Never Used  Substance Use Topics   Alcohol use: No    Alcohol/week: 0.0 standard drinks of alcohol   Drug use: No     Allergies   Zithromax [azithromycin] and Penicillins   Review of Systems Review of Systems Per HPI  Physical Exam Triage Vital Signs ED Triage Vitals  Encounter Vitals Group     BP 12/21/22 1553 115/79     Systolic BP Percentile --      Diastolic BP Percentile --      Pulse Rate 12/21/22 1553 74     Resp 12/21/22 1553 16     Temp 12/21/22 1553 97.6 F (36.4 C)     Temp Source 12/21/22 1553 Oral     SpO2  12/21/22 1553 97 %     Weight --      Height --      Head Circumference --      Peak Flow --      Pain Score 12/21/22 1551 10     Pain Loc --      Pain Education --      Exclude from Growth Chart --    No data found.  Updated Vital Signs BP 115/79 (BP Location: Right Arm)   Pulse 74   Temp 97.6 F (36.4 C) (Oral)   Resp 16   LMP 12/02/2022   SpO2 97%   Visual Acuity Right Eye Distance:   Left Eye Distance:   Bilateral Distance:    Right Eye Near:   Left Eye Near:    Bilateral Near:     Physical Exam Vitals and nursing note reviewed.  Constitutional:      General: She is not in acute distress.    Appearance: Normal appearance. She is not toxic-appearing.  HENT:     Mouth/Throat:     Mouth: Mucous membranes are moist.     Pharynx: Oropharynx is clear.  Pulmonary:     Effort: Pulmonary effort is normal. No respiratory distress.  Musculoskeletal:     Comments: Inspection: no swelling, bruising, obvious deformity or redness to base of neck, cervical spine, right shoulder, right arm Palpation: tender to palpation right shoulder diffusely, right trapezius muscle; no obvious deformities palpated ROM: Full ROM to right arm and neck Strength: 5/5 bilateral upper extremities Neurovascular: neurovascularly intact in bilateral upper extremities  Skin:    General: Skin is warm and dry.     Capillary Refill: Capillary refill takes less than 2 seconds.     Coloration: Skin is not jaundiced or pale.     Findings: No erythema.  Neurological:     Mental Status: She is alert and oriented to person, place, and time.  Psychiatric:        Behavior: Behavior is cooperative.      UC Treatments / Results  Labs (all labs ordered are listed, but only abnormal results are displayed) Labs Reviewed - No data to display  EKG   Radiology No results found.  Procedures Procedures (including critical care time)  Medications Ordered in UC Medications  ketorolac (TORADOL) 30  MG/ML injection 15 mg (15 mg Intramuscular Given 12/21/22 1620)    Initial Impression / Assessment and Plan / UC Course  I have reviewed the triage vital  signs and the nursing notes.  Pertinent labs & imaging results that were available during my care of the patient were reviewed by me and considered in my medical decision making (see chart for details).   Patient is well-appearing, normotensive, afebrile, not tachycardic, not tachypneic, oxygenating well on room air.    1. Acute pain of right shoulder 2. Neck pain Suspect muscular strain Given history of renal transplant, will treat with Toradol 50 mg IM in urgent care, discouraged use of NSAIDs for 48 hours, start Tylenol for pain, can also use tizanidine every hours as needed for pain/muscle spasms Recommended light range of motion/stretching exercises and close follow-up with sports medicine if symptoms do not improve with treatment Contact information provided for sports medicine  The patient was given the opportunity to ask questions.  All questions answered to their satisfaction.  The patient is in agreement to this plan.    Final Clinical Impressions(s) / UC Diagnoses   Final diagnoses:  Acute pain of right shoulder  Neck pain     Discharge Instructions      I believe you have strained a muscle in your shoulder and/or neck.  We have given you an injection of Toradol today which is an anti-inflammatory medicine.  Please stop taking the ibuprofen.  You can take the tizanidine every 8 hours as needed for muscular pain.  Recommend light range of motion and stretching exercises.  You can apply ice or heat to the area to help with pain.  Seek care if symptoms do not improve with treatment.  Recommend follow-up with sports medicine.  Contact information has been provided.   ED Prescriptions     Medication Sig Dispense Auth. Provider   tiZANidine (ZANAFLEX) 4 MG tablet Take 1 tablet (4 mg total) by mouth every 8 (eight) hours as  needed for muscle spasms. Do not take with alcohol or while driving or operating heavy machinery.  May cause drowsiness. 30 tablet Valentino Nose, NP      PDMP not reviewed this encounter.   Valentino Nose, NP 12/21/22 [phone]

## 2022-12-28 ENCOUNTER — Other Ambulatory Visit: Payer: Self-pay | Admitting: Nurse Practitioner

## 2022-12-30 NOTE — Telephone Encounter (Signed)
Requested Prescriptions  Pending Prescriptions Disp Refills   tiZANidine (ZANAFLEX) 4 MG tablet [Pharmacy Med Name: TIZANIDINE 4MG  TABLETS] 30 tablet 0    Sig: TAKE 1 TABLET BY MOUTH EVERY 8 HOURS AS NEEDED FOR MUSCLE SPASMS. DO NOT TAKE WITH ALCOHOL OR WHILE DRIVING. MAY CAUSE DROWSINESS     Not Delegated - Cardiovascular:  Alpha-2 [facility]onists - tizanidine Failed - 12/30/2022  9:28 AM      Failed - This refill cannot be delegated      Failed - Valid encounter within last 6 months    Recent Outpatient Visits   None

## 2023-07-08 ENCOUNTER — Emergency Department (HOSPITAL_COMMUNITY)

## 2023-07-08 ENCOUNTER — Emergency Department (HOSPITAL_COMMUNITY)
Admission: EM | Admit: 2023-07-08 | Discharge: 2023-07-09 | Discharge status: Against Medical Advice | Attending: Gastroenterology | Admitting: Gastroenterology

## 2023-07-08 ENCOUNTER — Other Ambulatory Visit: Payer: Self-pay

## 2023-07-08 DIAGNOSIS — Y92410 Unspecified street and highway as the place of occurrence of the external cause: Secondary | ICD-10-CM | POA: Diagnosis not present

## 2023-07-08 DIAGNOSIS — Z5321 Procedure and treatment not carried out due to patient leaving prior to being seen by health care provider: Secondary | ICD-10-CM | POA: Insufficient documentation

## 2023-07-08 DIAGNOSIS — R079 Chest pain, unspecified: Secondary | ICD-10-CM | POA: Insufficient documentation

## 2023-07-08 DIAGNOSIS — M542 Cervicalgia: Secondary | ICD-10-CM | POA: Diagnosis not present
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
## 2023-07-09 NOTE — ED Notes (Signed)
Pt stated that she was leaving  

## 2023-07-14 ENCOUNTER — Ambulatory Visit (HOSPITAL_COMMUNITY)

## 2023-07-14 ENCOUNTER — Encounter (HOSPITAL_COMMUNITY): Payer: Self-pay

## 2023-07-14 ENCOUNTER — Ambulatory Visit (HOSPITAL_COMMUNITY)
Admission: RE | Admit: 2023-07-14 | Discharge: 2023-07-14 | Disposition: A | Discharge status: Home / Self Care | Source: Ambulatory Visit | Attending: Emergency Medicine | Admitting: Emergency Medicine

## 2023-07-14 DIAGNOSIS — M542 Cervicalgia: Secondary | ICD-10-CM | POA: Diagnosis not present

## 2023-07-14 DIAGNOSIS — M546 Pain in thoracic spine: Secondary | ICD-10-CM | POA: Diagnosis not present

## 2023-07-14 MED ORDER — BACLOFEN 10 MG PO TABS: 10.0000 mg | ORAL_TABLET | Freq: Three times a day (TID) | ORAL | 0 refills | Status: DC

## 2023-07-14 MED ORDER — LIDOCAINE 5 % EX PTCH: 1.0000 | MEDICATED_PATCH | CUTANEOUS | 0 refills | Status: AC
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
## 2023-07-14 NOTE — Discharge Instructions (Addendum)
 Take baclofen every 8 hours as needed for muscle pain and spasms. You can apply lidocaine  patch for 12 hours at a time once daily for additional pain relief. Take 650 mg of Tylenol  every 6-8 hours as needed for breakthrough pain. Alternate between ice and heat as needed for pain. Follow-up with [facility] if your pain continues for further evaluation and management. Follow-up with your primary care provider or return here as needed.

## 2023-11-02 ENCOUNTER — Ambulatory Visit (HOSPITAL_COMMUNITY)
Admission: RE | Admit: 2023-11-02 | Discharge: 2023-11-02 | Disposition: A | Discharge status: Home / Self Care | Source: Ambulatory Visit | Attending: Internal Medicine | Admitting: Internal Medicine

## 2023-11-02 ENCOUNTER — Encounter (HOSPITAL_COMMUNITY): Payer: Self-pay

## 2023-11-02 VITALS — BP 106/72 | HR 87 | Temp 99.7°F | Resp 16

## 2023-11-02 DIAGNOSIS — N3000 Acute cystitis without hematuria: Secondary | ICD-10-CM | POA: Diagnosis not present

## 2023-11-02 DIAGNOSIS — R39198 Other difficulties with micturition: Secondary | ICD-10-CM | POA: Insufficient documentation

## 2023-11-02 DIAGNOSIS — Z940 Kidney transplant status: Secondary | ICD-10-CM | POA: Insufficient documentation

## 2023-11-02 DIAGNOSIS — R52 Pain, unspecified: Secondary | ICD-10-CM | POA: Diagnosis not present

## 2023-11-02 DIAGNOSIS — R509 Fever, unspecified: Secondary | ICD-10-CM | POA: Diagnosis not present

## 2023-11-02 LAB — POCT URINALYSIS DIP (MANUAL ENTRY)
Bilirubin, UA: NEGATIVE
Glucose, UA: NEGATIVE mg/dL
Ketones, POC UA: NEGATIVE mg/dL
Nitrite, UA: POSITIVE — AB
Protein Ur, POC: 30 mg/dL — AB
Spec Grav, UA: 1.015 (ref 1.010–1.025)
Urobilinogen, UA: 0.2 U/dL
pH, UA: 7 (ref 5.0–8.0)

## 2023-11-02 LAB — POC COVID19/FLU A&B COMBO
Covid Antigen, POC: NEGATIVE
Influenza A Antigen, POC: NEGATIVE
Influenza B Antigen, POC: NEGATIVE

## 2023-11-02 MED ORDER — CEFPODOXIME PROXETIL 200 MG PO TABS: 200.0000 mg | ORAL_TABLET | Freq: Two times a day (BID) | ORAL | 0 refills | Status: AC

## 2023-11-02 NOTE — ED Provider Notes (Signed)
 [facility]    CSN: [id number] Arrival date & time: 11/02/23  1612      History   Chief Complaint Chief Complaint  Patient presents with   Fever    Entered by patient    HPI Meghan Richardson is a 43 y.o. female.   43 year old female presents urgent care with complaints of bodyaches, fatigue, fevers, sweats, chills, difficulty urinating and headache.  Her symptoms started on the 17th.  At that time she was at work and began feeling bad so she left work early.  She was scheduled to have lab work done that day for her renal transplant so she went and had those labs done.  She then went home and went to bed.  Since that time she has had very little appetite and has been trying to stay hydrated.  She reports that she has developed some difficulty urinating but is having some diarrhea.  Her nephrologist contacted her regarding her lab work and reported that her urine at that time did not look bad and that they were going to send off for urine culture however her Miosha Behe blood cell count was elevated at 17.  Her renal function was stable.  They have her scheduled to come in on Tuesday for repeat lab work however she reports that she continued to feel bad and came in for further evaluation.  She denies any dysuria, hematuria, abdominal pain.  She is having some flank pain however her transplant kidney is in the right lower quadrant.  Her native kidneys are still in place.  Even though she has had minimal appetite she has been continuing to take her transplant medications.   Fever Associated symptoms: diarrhea and myalgias   Associated symptoms: no chest pain, no chills, no cough, no dysuria, no ear pain, no rash, no sore throat and no vomiting     Past Medical History:  Diagnosis Date   FSGS (focal segmental glomerulosclerosis)    Reports Kidney Bx at age 12   Hypertension     Patient Active Problem List   Diagnosis Date Noted   Peritonitis associated with peritoneal dialysis  10/03/2014   Hypertension due to end stage renal disease on dialysis (HCC) 10/03/2014   Anemia in chronic kidney disease (CKD) 10/03/2014    Past Surgical History:  Procedure Laterality Date   AV FISTULA PLACEMENT     NEPHRECTOMY TRANSPLANTED ORGAN      OB History   No obstetric history on file.      Home Medications    Prior to Admission medications   Medication Sig Start Date End Date Taking? Authorizing Provider  cefpodoxime  (VANTIN ) 200 MG tablet Take 1 tablet (200 mg total) by mouth 2 (two) times daily for 10 days. 11/02/23 11/12/23 Yes Asra Gambrel A, PA-C  Vitamin D, Ergocalciferol, (DRISDOL) 1.25 MG (50000 UNIT) CAPS capsule Take 50,000 Units by mouth every 7 (seven) days. 12/20/21 01/29/24 Yes [provider]  amLODipine (NORVASC) 5 MG tablet Take 5 mg by mouth daily.    [provider]  aspirin EC 81 MG tablet Take 81 mg by mouth daily.    [provider]  cinacalcet  (SENSIPAR ) 30 MG tablet Take 30 mg by mouth daily. 07/20/19   [provider]  labetalol  (NORMODYNE ) 100 MG tablet Take 100 mg by mouth 2 (two) times daily. 05/20/19   [provider]  lidocaine  (LIDODERM ) 5 % Place 1 patch onto the skin daily. Remove & Discard patch within 12 hours or as  directed by MD 07/14/23   Johnie Flaming A, NP  magnesium oxide (MAG-OX) 400 MG tablet Take 400 mg by mouth 2 (two) times daily.    [provider]  mycophenolate (MYFORTIC) 180 MG EC tablet Take 720 mg by mouth 2 (two) times daily. 07/20/19   [provider]  predniSONE  (DELTASONE ) 5 MG tablet Take 5 mg by mouth daily with breakfast.    [provider]  PROGRAF  5 MG capsule Take 5 mg by mouth 2 (two) times daily. 07/20/19   [provider]  sodium bicarbonate 650 MG tablet Take 1,300 mg by mouth 2 (two) times daily.  07/20/19   [provider]  tacrolimus  (PROGRAF ) 1 MG capsule Take 3 mg by mouth 2 (two) times daily.     [provider]    Family History Family History  Problem Relation Age of Onset   Kidney disease Brother        FSGS   Breast cancer Maternal Grandmother    Breast cancer Maternal Aunt     Social History Social History   Tobacco Use   Smoking status: Former    Types: Cigarettes   Smokeless tobacco: Never  Vaping Use   Vaping status: Never Used  Substance Use Topics   Alcohol use: No    Alcohol/week: 0.0 standard drinks of alcohol   Drug use: No     Allergies   Zithromax  [azithromycin ] and Penicillins   Review of Systems Review of Systems  Constitutional:  Positive for appetite change, fatigue and fever. Negative for chills.  HENT:  Negative for ear pain and sore throat.   Eyes:  Negative for pain and visual disturbance.  Respiratory:  Negative for cough and shortness of breath.   Cardiovascular:  Negative for chest pain and palpitations.  Gastrointestinal:  Positive for diarrhea. Negative for abdominal pain and vomiting.  Genitourinary:  Positive for difficulty urinating and flank pain. Negative for dysuria and hematuria.  Musculoskeletal:  Positive for myalgias. Negative for arthralgias and back pain.  Skin:  Negative for color change and rash.  Neurological:  Negative for seizures and syncope.  All other systems reviewed and are negative.    Physical Exam Triage Vital Signs ED Triage Vitals  Encounter Vitals Group     BP 11/02/23 1649 106/72     Girls Systolic BP Percentile --      Girls Diastolic BP Percentile --      Boys Systolic BP Percentile --      Boys Diastolic BP Percentile --      Pulse Rate 11/02/23 1649 87     Resp 11/02/23 1649 16     Temp 11/02/23 1649 99.7 F (37.6 C)     Temp Source 11/02/23 1649 Oral     SpO2 11/02/23 1649 96 %     Weight --      Height --      Head Circumference --      Peak Flow --      Pain Score 11/02/23 1647 9     Pain Loc --      Pain Education --      Exclude from Growth Chart --    No data found.  Updated Vital  Signs BP 106/72 (BP Location: Right Arm)   Pulse 87   Temp 99.7 F (37.6 C) (Oral)   Resp 16   LMP 10/18/2023 (Exact Date)   SpO2 96%   Visual Acuity Right Eye Distance:   Left Eye  Distance:   Bilateral Distance:    Right Eye Near:   Left Eye Near:    Bilateral Near:     Physical Exam Vitals and nursing note reviewed.  Constitutional:      General: She is not in acute distress.    Appearance: She is well-developed.  HENT:     Head: Normocephalic and atraumatic.     Nose: No congestion.     Mouth/Throat:     Mouth: Mucous membranes are moist.  Eyes:     Conjunctiva/sclera: Conjunctivae normal.  Cardiovascular:     Rate and Rhythm: Normal rate and regular rhythm.     Heart sounds: No murmur heard. Pulmonary:     Effort: Pulmonary effort is normal. No respiratory distress.     Breath sounds: Normal breath sounds.  Abdominal:     General: Bowel sounds are normal.     Palpations: Abdomen is soft.     Tenderness: There is no abdominal tenderness. There is no guarding or rebound.   Musculoskeletal:        General: No swelling.     Cervical back: Neck supple.  Skin:    General: Skin is warm and dry.     Capillary Refill: Capillary refill takes less than 2 seconds.  Neurological:     General: No focal deficit present.     Mental Status: She is alert.  Psychiatric:        Mood and Affect: Mood normal.      UC Treatments / Results  Labs (all labs ordered are listed, but only abnormal results are displayed) Labs Reviewed  POCT URINALYSIS DIP (MANUAL ENTRY) - Abnormal; Notable for the following components:      Result Value   Clarity, UA cloudy (*)    Blood, UA small (*)    Protein Ur, POC =30 (*)    Nitrite, UA Positive (*)    Leukocytes, UA Small (1+) (*)    All other components within normal limits  URINE CULTURE  POC COVID19/FLU A&B COMBO    EKG   Radiology No results found.  Procedures Procedures (including critical care time)  Medications  Ordered in UC Medications - No data to display  Initial Impression / Assessment and Plan / UC Course  I have reviewed the triage vital signs and the nursing notes.  Pertinent labs & imaging results that were available during my care of the patient were reviewed by me and considered in my medical decision making (see chart for details).     Acute cystitis without hematuria  Body aches - Plan: POC Covid19/Flu A&B Antigen, POC Covid19/Flu A&B Antigen  Fever in adult - Plan: POC Covid19/Flu A&B Antigen, POC Covid19/Flu A&B Antigen  Difficulty urinating   Flu A, flu B and COVID are negative.  Urinalysis done today does show positive nitrites and leukocytes consistent with a urinary tract infection.  When reviewing your chart you have taken Vantin  in the past for this and we will start this medication.  Would monitor your symptoms closely and if there is any worsening then I would go to the emergency room immediately.  Keep follow-up appointment as scheduled with your renal transplant team. Cefdodoxime (vantin ) 200 mg twice daily for 10 days.  Make sure to stay hydrated by drinking plenty of water. If symptoms worsening, abdominal pain develops, fever is not controlled with tylenol  or continue to have difficulty urinating then go to the emergency room immediately.      Final Clinical Impressions(s) /  UC Diagnoses   Final diagnoses:  Body aches  Fever in adult  Acute cystitis without hematuria  Difficulty urinating     Discharge Instructions      Flu A, flu B and COVID are negative.  Urinalysis done today does show positive nitrites and leukocytes consistent with a urinary tract infection.  When reviewing your chart you have taken Vantin  in the past for this and we will start this medication.  Would monitor your symptoms closely and if there is any worsening then I would go to the emergency room immediately.  Keep follow-up appointment as scheduled with your renal transplant  team. Cefdodoxime (vantin ) 200 mg twice daily for 10 days.  Make sure to stay hydrated by drinking plenty of water. If symptoms worsening, abdominal pain develops, fever is not controlled with tylenol  or continue to have difficulty urinating then go to the emergency room immediately.      ED Prescriptions     Medication Sig Dispense Auth. Provider   cefpodoxime  (VANTIN ) 200 MG tablet Take 1 tablet (200 mg total) by mouth 2 (two) times daily for 10 days. 20 tablet Teresa Almarie LABOR, [place]      PDMP not reviewed this encounter.   Teresa Almarie LABOR, [place] 11/02/23 1750

## 2023-11-02 NOTE — ED Triage Notes (Signed)
 Patient here today with c/o fever, chills, sweats, body aches, headache, and difficulty urinating. Patient had a kidney transplant in 2019.

## 2023-11-02 NOTE — Discharge Instructions (Addendum)
 Flu A, flu B and COVID are negative.  Urinalysis done today does show positive nitrites and leukocytes consistent with a urinary tract infection.  When reviewing your chart you have taken Vantin  in the past for this and we will start this medication.  Would monitor your symptoms closely and if there is any worsening then I would go to the emergency room immediately.  Keep follow-up appointment as scheduled with your renal transplant team. Cefdodoxime (vantin ) 200 mg twice daily for 10 days.  Make sure to stay hydrated by drinking plenty of water. If symptoms worsening, abdominal pain develops, fever is not controlled with tylenol  or continue to have difficulty urinating then go to the emergency room immediately.

## 2023-11-05 ENCOUNTER — Ambulatory Visit (HOSPITAL_COMMUNITY): Payer: Self-pay

## 2023-11-05 LAB — URINE CULTURE: Culture: >=100000 — AB
